# Patient Record
Sex: Female | Born: 1995 | Race: Black or African American | Hispanic: No | Marital: Single | Smoking: Never smoker
Health system: Southern US, Community
[De-identification: ages and names within clinical notes are randomized; demographics above are authoritative.]

## PROBLEM LIST (undated history)

## (undated) ENCOUNTER — Inpatient Hospital Stay: Payer: Self-pay

## (undated) ENCOUNTER — Inpatient Hospital Stay (HOSPITAL_COMMUNITY): Payer: Self-pay

## (undated) DIAGNOSIS — F909 Attention-deficit hyperactivity disorder, unspecified type: Secondary | ICD-10-CM

## (undated) DIAGNOSIS — L509 Urticaria, unspecified: Secondary | ICD-10-CM

## (undated) DIAGNOSIS — J3089 Other allergic rhinitis: Secondary | ICD-10-CM

## (undated) DIAGNOSIS — T783XXA Angioneurotic edema, initial encounter: Secondary | ICD-10-CM

## (undated) DIAGNOSIS — L309 Dermatitis, unspecified: Secondary | ICD-10-CM

## (undated) DIAGNOSIS — L209 Atopic dermatitis, unspecified: Secondary | ICD-10-CM

## (undated) DIAGNOSIS — J069 Acute upper respiratory infection, unspecified: Secondary | ICD-10-CM

## (undated) HISTORY — PX: WISDOM TOOTH EXTRACTION: SHX21

## (undated) HISTORY — DX: Acute upper respiratory infection, unspecified: J06.9

## (undated) HISTORY — DX: Urticaria, unspecified: L50.9

## (undated) HISTORY — DX: Angioneurotic edema, initial encounter: T78.3XXA

---
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
## 2016-01-21 DIAGNOSIS — F39 Unspecified mood [affective] disorder: Secondary | ICD-10-CM | POA: Diagnosis present

## 2016-02-11 ENCOUNTER — Ambulatory Visit (INDEPENDENT_AMBULATORY_CARE_PROVIDER_SITE_OTHER): Payer: Medicaid Other | Admitting: Allergy and Immunology

## 2016-02-11 ENCOUNTER — Encounter: Payer: Self-pay | Admitting: Allergy and Immunology

## 2016-02-11 VITALS — BP 128/68 | HR 72 | Temp 98.4°F | Resp 16 | Ht 62.6 in | Wt 190.7 lb

## 2016-02-11 DIAGNOSIS — L209 Atopic dermatitis, unspecified: Secondary | ICD-10-CM

## 2016-02-11 DIAGNOSIS — J3089 Other allergic rhinitis: Secondary | ICD-10-CM

## 2016-02-11 DIAGNOSIS — T7800XA Anaphylactic reaction due to unspecified food, initial encounter: Secondary | ICD-10-CM | POA: Diagnosis not present

## 2016-02-11 DIAGNOSIS — R062 Wheezing: Secondary | ICD-10-CM

## 2016-02-11 HISTORY — DX: Other allergic rhinitis: J30.89

## 2016-02-11 HISTORY — DX: Atopic dermatitis, unspecified: L20.9

## 2016-02-11 MED ORDER — ALBUTEROL SULFATE HFA 108 (90 BASE) MCG/ACT IN AERS: INHALATION_SPRAY | RESPIRATORY_TRACT | Status: DC

## 2016-02-11 MED ORDER — EPINEPHRINE 0.3 MG/0.3ML IJ SOAJ: 0.3000 mg | Freq: Once | INTRAMUSCULAR | Status: DC

## 2016-02-11 MED ORDER — CETIRIZINE HCL 10 MG PO TABS
10.0000 mg | ORAL_TABLET | Freq: Every day | ORAL | Status: DC
Start: 2016-02-11 — End: 2017-04-22

## 2016-02-11 MED ORDER — FLUTICASONE PROPIONATE 50 MCG/ACT NA SUSP: 2.0000 | Freq: Every day | NASAL | Status: DC | PRN

## 2016-02-11 NOTE — Assessment & Plan Note (Signed)
   Continue albuterol HFA every 4-6 hours as needed.  Subjective and objective measures of pulmonary function will be followed and the treatment plan will be adjusted accordingly.

## 2016-02-11 NOTE — Patient Instructions (Addendum)
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  Meticulous avoidance of shellfish as discussed.  The importance of having access to epinephrine auto-injector 2 pack has been emphasized.  A food allergy action plan has been provided and discussed.  Medic Alert identification is recommended.  Seasonal and perennial allergic rhinoconjunctivitis  Aeroallergen avoidance measures have been discussed and provided in written form.  A prescription has been provided for fluticasone nasal spray, 2 sprays per nostril daily as needed. Proper nasal spray technique has been discussed and demonstrated.  A prescription has been provided for cetirizine 10 mg daily as needed.  If allergen avoidance measures and medications fail to adequately relieve symptoms, aeroallergen immunotherapy will be considered.  Atopic dermatitis  Appropriate skin care recommendations have been provided verbally and in written form.  A prescription has been provided for desonide 0.05% ointment sparingly to affected areas twice daily as needed to the face and/or neck. Care is to be taken to avoid the eyes.  A prescription has been provided for mometasone 0.1% ointment sparingly to affected areas daily as needed below the face and neck. Care is to be taken to avoid the axillae and groin area.  The patient has been asked to make note of any foods that trigger symptom flares.  Fingernails are to be kept trimmed.  Information regarding diluted bleach baths has been discussed and provided in written form.  Intermittent coughing/wheezing  Continue albuterol HFA every 4-6 hours as needed.  Subjective and objective measures of pulmonary function will be followed and the treatment plan will be adjusted accordingly.    Return in about 4 months (around 06/12/2016), or if symptoms worsen or fail to improve.  ECZEMA SKIN CARE REGIMEN:  Bathed and soak  for 10 minutes in warm water once today. Pat dry.  Immediately apply the below creams: To healthy skin apply Aquaphor or Vaseline jelly twice a day. To affected areas on the face and neck, apply: . Desonide 0.05% ointment twice a day as needed. . Be careful to avoid the eyes. To affected areas on the body (below the face and neck), apply: . Mometasone 0.1% ointment once a day as needed. . With ointments be careful to avoid the armpits and groin area. Note of any foods make the eczema worse. Keep finger nails trimmed and filed.  Diluted bleach bath recipe and instructions:   Add  -  cup of common household bleach to a bathtub full of water.  Soak the affected part of the body (below the head and neck) for about 10 minutes.  Limit diluted bleach baths to no more than twice a week.   Do not submerge the head or face and be very careful to avoid getting the diluted bleach into the eyes.   Rinse off with fresh water and apply moisturizer.  Reducing Pollen Exposure  The American Academy of Allergy, Asthma and Immunology suggests the following steps to reduce your exposure to pollen during allergy seasons.    1. Do not hang sheets or clothing out to dry; pollen may collect on these items. 2. Do not mow lawns or spend time around freshly cut grass; mowing stirs up pollen. 3. Keep windows closed at night.  Keep car windows closed while driving. 4. Minimize morning activities outdoors, a time when pollen counts are usually at their highest. 5. Stay indoors as much as possible when pollen counts or humidity is high and on windy days when pollen  tends to remain in the air longer. 6. Use air conditioning when possible.  Many air conditioners have filters that trap the pollen spores. 7. Use a HEPA room air filter to remove pollen form the indoor air you breathe.   Control of House Dust Mite Allergen  House dust mites play a major role in allergic asthma and rhinitis.  They occur in  environments with high humidity wherever human skin, the food for dust mites is found. High levels have been detected in dust obtained from mattresses, pillows, carpets, upholstered furniture, bed covers, clothes and soft toys.  The principal allergen of the house dust mite is found in its feces.  A gram of dust may contain 1,000 mites and 250,000 fecal particles.  Mite antigen is easily measured in the air during house cleaning activities.    1. Encase mattresses, including the box spring, and pillow, in an air tight cover.  Seal the zipper end of the encased mattresses with wide adhesive tape. 2. Wash the bedding in water of 130 degrees Farenheit weekly.  Avoid cotton comforters/quilts and flannel bedding: the most ideal bed covering is the dacron comforter. 3. Remove all upholstered furniture from the bedroom. 4. Remove carpets, carpet padding, rugs, and non-washable window drapes from the bedroom.  Wash drapes weekly or use plastic window coverings. 5. Remove all non-washable stuffed toys from the bedroom.  Wash stuffed toys weekly. 6. Have the room cleaned frequently with a vacuum cleaner and a damp dust-mop.  The patient should not be in a room which is being cleaned and should wait 1 hour after cleaning before going into the room. 7. Close and seal all heating outlets in the bedroom.  Otherwise, the room will become filled with dust-laden air.  An electric heater can be used to heat the room. 8. Reduce indoor humidity to less than 50%.  Do not use a humidifier.

## 2016-02-11 NOTE — Progress Notes (Signed)
New Patient Note  RE: Alyssa [name] MRN: [id number] DOB: 1996/04/28 Date of Office Visit: 02/11/2016  Referring provider: Verlon AuBoyd, Tammy Lamonica, MD Primary care provider: Verlon AuBoyd, Tammy Lamonica, MD  Chief Complaint: Food Intolerance; Allergic Rhinitis ; and Eczema   History of present illness: HPI Comments: Alyssa [name] is a 20 y.o. female who presents today for consultation of possible food allergy and rhinitis.  She is accompanied by her mother who assists with the history.  This past summer she consumed shrimp, took a nap, and woke up with facial swelling and swelling of the eyelids.  She denies concomitant cardiopulmonary or GI symptoms.  Approximately one month ago she consumed 1 shrimp and within 15 minutes developed flushing, rash, and burning sensation on her chest, neck, and face.  Approximately 3 weeks ago, she accidentally consumed a bite of someone's meal which contained shrimp and within minutes developed facial swelling, ringing of the ears, visual changes, and dyspnea.  She took diphenhydramine and her symptoms resolved completely within an hour.  She has meticulously avoided shrimp since that time.  She has epinephrine autoinjectors.   [name] experiences nasal congestion, rhinorrhea, sneezing, and ocular pruritus.  These symptoms occur predominantly with pollen exposure.  She has had eczema since infancy.  The eczema primarily involves her wrists, hands, arms, ankles, legs, abdomen, and her face in a perioral distribution.  Her eczema seems to be exacerbated by heat and consumption of acidic foods, such as tomato products.  The eczema is currently not well controlled with triamcinolone 0.1% cream.  In the past, triamcinolone 0.1% ointment has worked a little bit better than the cream for her eczema.  Her mother reports that Alyssa [name] has a history of bronchitis and "borderline asthma".  Her lower respiratory symptoms consist of coughing, chest tightness, and  wheezing.  These symptoms occur rarely and seem to be triggered by hot air and pollen exposure.  She has an albuterol HFA inhaler.   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  Meticulous avoidance of shellfish as discussed.  The importance of having access to epinephrine auto-injector 2 pack has been emphasized.  A food allergy action plan has been provided and discussed.  Medic Alert identification is recommended.  Seasonal and perennial allergic rhinoconjunctivitis  Aeroallergen avoidance measures have been discussed and provided in written form.  A prescription has been provided for fluticasone nasal spray, 2 sprays per nostril daily as needed. Proper nasal spray technique has been discussed and demonstrated.  A prescription has been provided for cetirizine 10 mg daily as needed.  If allergen avoidance measures and medications fail to adequately relieve symptoms, aeroallergen immunotherapy will be considered.  Atopic dermatitis  Appropriate skin care recommendations have been provided verbally and in written form.  A prescription has been provided for desonide 0.05% ointment sparingly to affected areas twice daily as needed to the face and/or neck. Care is to be taken to avoid the eyes.  A prescription has been provided for mometasone 0.1% ointment sparingly to affected areas daily as needed below the face and neck. Care is to be taken to avoid the axillae and groin area.  The patient has been asked to make note of any foods that trigger symptom flares.  Fingernails are to be kept trimmed.  Information regarding diluted bleach baths has been discussed and provided in written form.  Intermittent coughing/wheezing  Continue albuterol HFA every 4-6 hours as needed.  Subjective and objective measures of pulmonary function will be followed and the treatment plan will be  adjusted accordingly.    Meds ordered this encounter  Medications  . EPINEPHrine 0.3 mg/0.3 mL IJ SOAJ injection    Sig: Inject 0.3 mLs (0.3 mg total) into the muscle once.    Dispense:  2 Device    Refill:  2    PLACE PRESCRIPTION ON HOLD.  . fluticasone (FLONASE) 50 MCG/ACT nasal spray    Sig: Place 2 sprays into both nostrils daily as needed for allergies or rhinitis.    Dispense:  16 g    Refill:  5  . cetirizine (ZYRTEC) 10 MG tablet    Sig: Take 1 tablet (10 mg total) by mouth daily.    Dispense:  30 tablet    Refill:  5  . albuterol (PROAIR HFA) 108 (90 Base) MCG/ACT inhaler    Sig: TWO PUFFS EVERY 4-6 HOURS AS NEEDED FOR COUGH OR WHEEZE.    Dispense:  1 Inhaler    Refill:  1    Diagnositics: Spirometry: FVC was 3.30 L (95% predicted) and FEV1 was 2.72 L (88% predicted) without significant post bronchodilator improvement. AEnvironmental skin testing: Positive to grass pollen, ragweed pollen, weed pollen, tree pollen, and dust mite antigen. Food allergen skin testing: Positive to shellfish mix.    Physical examination: Blood pressure 128/68, pulse 72, temperature 98.4 F (36.9 C), temperature source Oral, resp. rate 16, height 5' 2.6" (1.59 m), weight 190 lb 11.2 oz (86.5 kg).  General: Alert, interactive, in no acute distress. HEENT: TMs pearly gray, turbinates edematous without discharge, post-pharynx moderately erythematous. Neck: Supple without lymphadenopathy. Lungs: Clear to auscultation without wheezing, rhonchi or rales. CV: Normal S1, S2 without murmurs. Abdomen: Nondistended, nontender. Skin: Dry, mildly hyperpigmented, mildly thickened patches on the hands, wrists, forearms, antecubital fossae, and ankles bilaterally. Extremities:  No clubbing, cyanosis or edema. Neuro:   Grossly intact.  Review of systems:  Review of Systems  Constitutional: Negative for fever, chills and weight loss.  HENT: Positive for congestion. Negative for nosebleeds.   Eyes:  Negative for blurred vision.  Respiratory: Positive for cough, shortness of breath and wheezing. Negative for hemoptysis.   Cardiovascular: Negative for chest pain.  Gastrointestinal: Negative for diarrhea and constipation.  Genitourinary: Negative for dysuria.  Musculoskeletal: Negative for myalgias and joint pain.  Skin: Positive for itching and rash.  Neurological: Negative for dizziness.  Endo/Heme/Allergies: Positive for environmental allergies. Does not bruise/bleed easily.    Past medical history:  Past Medical History  Diagnosis Date  . Asthma   . Eczema   . ADHD (attention deficit hyperactivity disorder)   . Recurrent upper respiratory infection (URI)   . Urticaria   . Angio-edema     Past surgical history:  Past Surgical History  Procedure Laterality Date  . Wisdom tooth extraction      Family history: Family History  Problem Relation Age of Onset  . Asthma Mother   . Eczema Mother   . Allergic rhinitis Mother   . Angioedema Mother   . Asthma Brother   . Urticaria Neg Hx   . Immunodeficiency Neg Hx   . Asthma Brother   . Allergic rhinitis Brother     Social history: Social History   Social History  . Marital Status: Single    Spouse Name: N/A  . Number of Children: N/A  . Years of Education: N/A   Occupational History  . Not on file.   Social  History Main Topics  . Smoking status: Never Smoker   . Smokeless tobacco: Never Used  . Alcohol Use: No  . Drug Use: No  . Sexual Activity: Not on file   Other Topics Concern  . Not on file   Social History Narrative   Environmental History:  The patient lives in an apartment with carpeting throughout and central heat/air. She is a nonsmoker without pets.    Medication List       This list is accurate as of: 02/11/16  5:52 PM.  Always use your most recent med list.               albuterol 108 (90 Base) MCG/ACT inhaler  Commonly known as:  PROAIR HFA  TWO PUFFS EVERY 4-6 HOURS AS NEEDED FOR  COUGH OR WHEEZE.     cetirizine 10 MG tablet  Commonly known as:  ZYRTEC  Take 1 tablet (10 mg total) by mouth daily.     EPINEPHrine 0.3 mg/0.3 mL Soaj injection  Commonly known as:  EPI-PEN  Inject 0.3 mLs (0.3 mg total) into the muscle once.     fluticasone 50 MCG/ACT nasal spray  Commonly known as:  FLONASE  Place 2 sprays into both nostrils daily as needed for allergies or rhinitis.     ibuprofen 600 MG tablet  Commonly known as:  ADVIL,MOTRIN  Take 1 tablet (600 mg total) by mouth every 8 (eight) hours as needed for mild pain or moderate pain.     predniSONE 50 MG tablet  Commonly known as:  DELTASONE  Take 1 tablet (50 mg total) by mouth daily.     triamcinolone cream 0.1 %  Commonly known as:  KENALOG  Apply topically.        Known medication allergies: Allergies  Allergen Reactions  . Banana Itching and Swelling  . Peach Flavor Itching    I appreciate the opportunity to take part in this Clementine's care. Please do not hesitate to contact me with questions.  Sincerely,   R. Jorene Guest, MD

## 2016-02-11 NOTE — Assessment & Plan Note (Addendum)
The patient's history suggests shellfish allergy and positive skin test results today confirm this diagnosis.  Meticulous avoidance of shellfish as discussed.  The importance of having access to epinephrine auto-injector 2 pack has been emphasized.  A food allergy action plan has been provided and discussed.  Medic Alert identification is recommended.

## 2016-02-11 NOTE — Assessment & Plan Note (Signed)
   Aeroallergen avoidance measures have been discussed and provided in written form.  A prescription has been provided for fluticasone nasal spray, 2 sprays per nostril daily as needed. Proper nasal spray technique has been discussed and demonstrated.  A prescription has been provided for cetirizine 10 mg daily as needed.  If allergen avoidance measures and medications fail to adequately relieve symptoms, aeroallergen immunotherapy will be considered. 

## 2016-02-11 NOTE — Assessment & Plan Note (Addendum)
   Appropriate skin care recommendations have been provided verbally and in written form.  A prescription has been provided for desonide 0.05% ointment sparingly to affected areas twice daily as needed to the face and/or neck. Care is to be taken to avoid the eyes.  A prescription has been provided for mometasone 0.1% ointment sparingly to affected areas daily as needed below the face and neck. Care is to be taken to avoid the axillae and groin area.  The patient has been asked to make note of any foods that trigger symptom flares.  Fingernails are to be kept trimmed.  Information regarding diluted bleach baths has been discussed and provided in written form.

## 2016-02-12 ENCOUNTER — Other Ambulatory Visit: Payer: Self-pay

## 2016-02-12 MED ORDER — MOMETASONE FUROATE 0.1 % EX OINT: TOPICAL_OINTMENT | CUTANEOUS | Status: DC

## 2016-02-12 MED ORDER — DESONIDE 0.05 % EX OINT: 1.0000 "application " | TOPICAL_OINTMENT | Freq: Two times a day (BID) | CUTANEOUS | Status: DC

## 2016-02-12 NOTE — Telephone Encounter (Signed)
Patient was seen on yesterday by Dr. Nunzio CobbsBobbitt, [facility] [facility] [address] and her creams were not called in.  Please Advise

## 2016-02-12 NOTE — Telephone Encounter (Signed)
RX FOR DESONIDE AND MOMETASONE OINTMENT SENT TO [facility], NO ANSWER ON PHONE

## 2016-02-18 ENCOUNTER — Encounter: Payer: Self-pay | Admitting: *Deleted

## 2016-03-26 ENCOUNTER — Encounter (HOSPITAL_COMMUNITY): Payer: Self-pay | Admitting: Emergency Medicine

## 2016-03-26 ENCOUNTER — Emergency Department (HOSPITAL_COMMUNITY)
Admission: EM | Admit: 2016-03-26 | Discharge: 2016-03-26 | Disposition: A | Discharge status: Home / Self Care | Payer: Medicaid Other | Attending: Emergency Medicine | Admitting: Emergency Medicine

## 2016-03-26 ENCOUNTER — Emergency Department (HOSPITAL_COMMUNITY): Payer: Medicaid Other

## 2016-03-26 DIAGNOSIS — Z8659 Personal history of other mental and behavioral disorders: Secondary | ICD-10-CM | POA: Diagnosis not present

## 2016-03-26 DIAGNOSIS — J45909 Unspecified asthma, uncomplicated: Secondary | ICD-10-CM | POA: Diagnosis not present

## 2016-03-26 DIAGNOSIS — Z872 Personal history of diseases of the skin and subcutaneous tissue: Secondary | ICD-10-CM | POA: Insufficient documentation

## 2016-03-26 DIAGNOSIS — S29001A Unspecified injury of muscle and tendon of front wall of thorax, initial encounter: Secondary | ICD-10-CM | POA: Insufficient documentation

## 2016-03-26 DIAGNOSIS — Y9389 Activity, other specified: Secondary | ICD-10-CM | POA: Insufficient documentation

## 2016-03-26 DIAGNOSIS — Y998 Other external cause status: Secondary | ICD-10-CM | POA: Insufficient documentation

## 2016-03-26 DIAGNOSIS — Z7951 Long term (current) use of inhaled steroids: Secondary | ICD-10-CM | POA: Insufficient documentation

## 2016-03-26 DIAGNOSIS — Z79899 Other long term (current) drug therapy: Secondary | ICD-10-CM | POA: Diagnosis not present

## 2016-03-26 DIAGNOSIS — S0990XA Unspecified injury of head, initial encounter: Secondary | ICD-10-CM | POA: Diagnosis not present

## 2016-03-26 DIAGNOSIS — Y92410 Unspecified street and highway as the place of occurrence of the external cause: Secondary | ICD-10-CM | POA: Insufficient documentation

## 2016-03-26 DIAGNOSIS — S4991XA Unspecified injury of right shoulder and upper arm, initial encounter: Secondary | ICD-10-CM | POA: Insufficient documentation

## 2016-03-26 DIAGNOSIS — S199XXA Unspecified injury of neck, initial encounter: Secondary | ICD-10-CM | POA: Diagnosis not present

## 2016-03-26 MED ORDER — IBUPROFEN 100 MG/5ML PO SUSP
600.0000 mg | Freq: Once | ORAL | Status: AC
  Administered 2016-03-26: 600 mg via ORAL
  Filled 2016-03-26: qty 30

## 2016-03-26 MED ORDER — HYDROCODONE-ACETAMINOPHEN 5-325 MG PO TABS: 1.0000 | ORAL_TABLET | Freq: Four times a day (QID) | ORAL | Status: DC | PRN
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
## 2016-03-26 NOTE — Discharge Instructions (Signed)
Return here as needed.  Follow-up with the orthopedist provide. Apply ice to your shoulder.

## 2016-05-22 ENCOUNTER — Emergency Department (HOSPITAL_COMMUNITY)
Admission: EM | Admit: 2016-05-22 | Discharge: 2016-05-22 | Disposition: A | Discharge status: Home / Self Care | Payer: Medicaid Other | Attending: Emergency Medicine | Admitting: Emergency Medicine

## 2016-05-22 ENCOUNTER — Encounter (HOSPITAL_COMMUNITY): Payer: Self-pay | Admitting: *Deleted

## 2016-05-22 DIAGNOSIS — Y939 Activity, unspecified: Secondary | ICD-10-CM | POA: Insufficient documentation

## 2016-05-22 DIAGNOSIS — J45909 Unspecified asthma, uncomplicated: Secondary | ICD-10-CM | POA: Diagnosis not present

## 2016-05-22 DIAGNOSIS — Y999 Unspecified external cause status: Secondary | ICD-10-CM | POA: Insufficient documentation

## 2016-05-22 DIAGNOSIS — B351 Tinea unguium: Secondary | ICD-10-CM | POA: Diagnosis not present

## 2016-05-22 DIAGNOSIS — Y929 Unspecified place or not applicable: Secondary | ICD-10-CM | POA: Diagnosis not present

## 2016-05-22 DIAGNOSIS — W1830XA Fall on same level, unspecified, initial encounter: Secondary | ICD-10-CM | POA: Insufficient documentation

## 2016-05-22 DIAGNOSIS — M79674 Pain in right toe(s): Secondary | ICD-10-CM | POA: Diagnosis present

## 2016-05-22 DIAGNOSIS — M79675 Pain in left toe(s): Secondary | ICD-10-CM

## 2016-05-22 DIAGNOSIS — Z79899 Other long term (current) drug therapy: Secondary | ICD-10-CM | POA: Diagnosis not present

## 2016-05-22 MED ORDER — EFINACONAZOLE 10 % EX SOLN: 1.0000 "application " | Freq: Every day | CUTANEOUS | Status: DC

## 2016-05-22 NOTE — ED Provider Notes (Signed)
CSN: [id number]     Arrival date & time 05/22/16  2149 History  By signing my name below, I, Placido SouLogan Joldersma, attest that this documentation has been prepared under the direction and in the presence of Everlene FarrierWilliam Daxon Kyne, PA-C. Electronically Signed: Placido SouLogan Joldersma, ED Scribe. 05/22/2016. 11:31 PM.   Chief Complaint  Patient presents with  . Foot Pain   The history is provided by the patient. No language interpreter was used.    HPI Comments: Alyssa [name] is a 20 y.o. female who presents to the Emergency Department due to her right great toenail falling off 2 days ago. Pt states that "something was wrong with my toenail" and attempted to clean the affected toenail with it then falling off. She reports accidentally striking it "a couple of times" recently, prior to the onset of her symptoms. She states she is concerned her toenails "have fungus". Pt confirms being ambulatory. Her toenails are currently painted. She denies fevers, chills, numbness or tingling, or any other associated symptoms at this time.   Past Medical History  Diagnosis Date  . Asthma   . Eczema   . ADHD (attention deficit hyperactivity disorder)   . Recurrent upper respiratory infection (URI)   . Urticaria   . Angio-edema    Past Surgical History  Procedure Laterality Date  . Wisdom tooth extraction     Family History  Problem Relation Age of Onset  . Asthma Mother   . Eczema Mother   . Allergic rhinitis Mother   . Angioedema Mother   . Asthma Brother   . Urticaria Neg Hx   . Immunodeficiency Neg Hx   . Asthma Brother   . Allergic rhinitis Brother    Social History  Substance Use Topics  . Smoking status: Never Smoker   . Smokeless tobacco: Never Used  . Alcohol Use: No   OB History    No data available     Review of Systems  Constitutional: Negative for fever and chills.  Musculoskeletal: Negative for myalgias and arthralgias.  Skin: Positive for wound. Negative for color change.   Neurological: Negative for weakness and numbness.    Allergies  Banana; Peach flavor; and Shellfish allergy  Home Medications   Prior to Admission medications   Medication Sig Start Date End Date Taking? Authorizing Provider  albuterol (PROAIR HFA) 108 (90 Base) MCG/ACT inhaler TWO PUFFS EVERY 4-6 HOURS AS NEEDED FOR COUGH OR WHEEZE. 02/11/16   Cristal Fordalph Carter Bobbitt, MD  cetirizine (ZYRTEC) 10 MG tablet Take 1 tablet (10 mg total) by mouth daily. 02/11/16   Cristal Fordalph Carter Bobbitt, MD  desonide (DESOWEN) 0.05 % ointment Apply 1 application topically 2 (two) times daily. Apply to face/neck once daily avoiding eyes as directed 02/12/16   Cristal Fordalph Carter Bobbitt, MD  Efinaconazole 10 % SOLN Apply 1 application topically daily. 05/22/16   Everlene FarrierWilliam Charlann Wayne, PA-C  EPINEPHrine 0.3 mg/0.3 mL IJ SOAJ injection Inject 0.3 mLs (0.3 mg total) into the muscle once. 02/11/16   Cristal Fordalph Carter Bobbitt, MD  fluticasone [facility](FLONASE) 50 MCG/ACT nasal spray Place 2 sprays into both nostrils daily as needed for allergies or rhinitis. 02/11/16   Cristal Fordalph Carter Bobbitt, MD  HYDROcodone-acetaminophen (NORCO/VICODIN) 5-325 MG tablet Take 1 tablet by mouth every 6 (six) hours as needed for moderate pain. 03/26/16   Charlestine Nighthristopher Lawyer, PA-C  mometasone (ELOCON) 0.1 % ointment Apply to areas below face/neck once daily as directed 02/12/16   Cristal Fordalph Carter Bobbitt, MD  triamcinolone cream (KENALOG) 0.1 % Apply 1 application  topically 2 (two) times daily.  01/21/16 01/20/17  Historical Provider, MD   BP 126/93 mmHg  Pulse 72  Temp(Src) 98 F (36.7 C) (Oral)  Resp 16  Ht 5\' 2"  (1.575 m)  Wt 81.647 kg  BMI 32.91 kg/m2  SpO2 100%    Physical Exam  Constitutional: She appears well-developed and well-nourished. No distress.  Nontoxic appearing.  HENT:  Head: Normocephalic and atraumatic.  Eyes: Right eye exhibits no discharge. Left eye exhibits no discharge.  Cardiovascular: Normal rate, regular rhythm and intact distal pulses.   Bilateral  DP/PT pulses intact  Pulmonary/Chest: Effort normal. No respiratory distress.  Musculoskeletal: Normal range of motion. She exhibits no edema.  Nail polish noted to toenails. Left great toe nail is partially missing however nailbed is intact. Onychomycosis of great toes evident beneath nail polish.  No erythema, edema or ecchymosis. No evidence of infection.   Neurological: She is alert. Coordination normal.  Skin: Skin is warm and dry. No rash noted. She is not diaphoretic. No erythema. No pallor.  Psychiatric: She has a normal mood and affect. Her behavior is normal.  Nursing note and vitals reviewed.   ED Course  Procedures  DIAGNOSTIC STUDIES: Oxygen Saturation is 100% on RA, normal by my interpretation.    COORDINATION OF CARE: 11:29 PM Discussed next steps with pt. Pt verbalized understanding and is agreeable with the plan.   Labs Review Labs Reviewed - No data to display  Imaging Review No results found.   EKG Interpretation None      Filed Vitals:   05/22/16 2252 05/22/16 2326  BP: 117/92 126/93  Pulse: 60 72  Temp: 98.4 F (36.9 C) 98 F (36.7 C)  TempSrc: Oral Oral  Resp: 18 16  Height: 5\' 2"  (1.575 m)   Weight: 81.647 kg   SpO2: 100% 100%     MDM   Meds given in ED:  Medications - No data to display  Discharge Medication List as of 05/22/2016 11:35 PM    START taking these medications   Details  Efinaconazole 10 % SOLN Apply 1 application topically daily., Starting 05/22/2016, Until Discontinued, Print        Final diagnoses:  Pain due to onychomycosis of toenail of left foot   This is a 20 y.o. female who presents to the Emergency Department due to her right great toenail falling off 2 days ago. Pt states that "something was wrong with my toenail" and attempted to clean the affected toenail with it then falling off. She reports accidentally striking it "a couple of times" recently, prior to the onset of her symptoms. She states she is concerned her  toenails "have fungus". Pt confirms being ambulatory. Her toenails are currently painted.  On exam the patient is afebrile nontoxic appearing. Her toenails are painted. Her right great toenail is partially absent. Nailbed is intact. Evidence of onychomycosis beneath her nail bed. No erythema or edema. No evidence of skin infection. Suspect this is related to onychomycosis. I advised patient to remove her toenail polish. Eyelashes soak and vinegar and use jublia on nails for fungus. I encouraged her to make an appointment for follow-up with podiatry. I advised the patient to follow-up with their primary care provider this week. I advised the patient to return to the emergency department with new or worsening symptoms or new concerns. The patient verbalized understanding and agreement with plan.    I personally performed the services described in this documentation, which was scribed in my presence.  The recorded information has been reviewed and is accurate.       Everlene Farrier, PA-C 05/23/16 4098  Tomasita Crumble, MD 05/23/16 [phone]

## 2016-05-22 NOTE — ED Notes (Signed)
Pt states that her big toe on her right foot lost its nail a couple days ago. States that there is pain. States that her left foot big toe is painful. Pt is concerned that the nails have fungus.

## 2016-05-22 NOTE — Discharge Instructions (Signed)
Nail Ringworm A fungal infection of the nail (tinea unguium/onychomycosis) is common. It is common as the visible part of the nail is composed of dead cells which have no blood supply to help prevent infection. It occurs because fungi are everywhere and will pick any opportunity to grow on any dead material. Because nails are very slow growing they require up to 2 years of treatment with anti-fungal medications. The entire nail back to the base is infected. This includes approximately  of the nail which you cannot see. If your caregiver has prescribed a medication by mouth, take it every day and as directed. No progress will be seen for at least 6 to 9 months. Do not be disappointed! Because fungi live on dead cells with little or no exposure to blood supply, medication delivery to the infection is slow; thus the cure is slow. It is also why you can observe no progress in the first 6 months. The nail becoming cured is the base of the nail, as it has the blood supply. Topical medication such as creams and ointments are usually not effective. Important in successful treatment of nail fungus is closely following the medication regimen that your doctor prescribes. Sometimes you and your caregiver may elect to speed up this process by surgical removal of all the nails. Even this may still require 6 to 9 months of additional oral medications. See your caregiver as directed. Remember there will be no visible improvement for at least 6 months. See your caregiver sooner if other signs of infection (redness and swelling) develop.   This information is not intended to replace advice given to you by your health care provider. Make sure you discuss any questions you have with your health care provider.   Document Released: 11/07/2000 Document Revised: 03/27/2015 Document Reviewed: 05/14/2015 Elsevier Interactive Patient Education 2016 [employer]  

## 2016-06-20 ENCOUNTER — Ambulatory Visit (HOSPITAL_COMMUNITY)
Admission: EM | Admit: 2016-06-20 | Discharge: 2016-06-20 | Disposition: A | Discharge status: Other Acute Inpatient Hospital | Payer: Medicaid Other

## 2016-06-20 NOTE — ED Triage Notes (Signed)
Patient not seen.

## 2016-07-19 ENCOUNTER — Encounter (HOSPITAL_COMMUNITY): Payer: Self-pay | Admitting: Emergency Medicine

## 2016-07-19 ENCOUNTER — Emergency Department (HOSPITAL_COMMUNITY)
Admission: EM | Admit: 2016-07-19 | Discharge: 2016-07-19 | Disposition: A | Discharge status: Home / Self Care | Payer: Medicaid Other | Attending: Emergency Medicine | Admitting: Emergency Medicine

## 2016-07-19 DIAGNOSIS — F909 Attention-deficit hyperactivity disorder, unspecified type: Secondary | ICD-10-CM | POA: Insufficient documentation

## 2016-07-19 DIAGNOSIS — R079 Chest pain, unspecified: Secondary | ICD-10-CM

## 2016-07-19 DIAGNOSIS — R0789 Other chest pain: Secondary | ICD-10-CM | POA: Diagnosis not present

## 2016-07-19 DIAGNOSIS — R1032 Left lower quadrant pain: Secondary | ICD-10-CM

## 2016-07-19 DIAGNOSIS — J45909 Unspecified asthma, uncomplicated: Secondary | ICD-10-CM | POA: Diagnosis not present

## 2016-07-19 DIAGNOSIS — R1030 Lower abdominal pain, unspecified: Secondary | ICD-10-CM | POA: Insufficient documentation

## 2016-07-19 DIAGNOSIS — R1031 Right lower quadrant pain: Secondary | ICD-10-CM

## 2016-07-19 LAB — URINALYSIS, ROUTINE W REFLEX MICROSCOPIC
BILIRUBIN URINE: NEGATIVE
GLUCOSE, UA: NEGATIVE mg/dL
HGB URINE DIPSTICK: NEGATIVE
Ketones, ur: NEGATIVE mg/dL
LEUKOCYTES UA: NEGATIVE
Nitrite: NEGATIVE
PH: 6.5 (ref 5.0–8.0)
Protein, ur: NEGATIVE mg/dL
Specific Gravity, Urine: 1.026 (ref 1.005–1.030)

## 2016-07-19 LAB — WET PREP, GENITAL
Clue Cells Wet Prep HPF POC: NONE SEEN
Sperm: NONE SEEN
Trich, Wet Prep: NONE SEEN
Yeast Wet Prep HPF POC: NONE SEEN

## 2016-07-19 LAB — PREGNANCY, URINE: PREG TEST UR: NEGATIVE

## 2016-07-19 MED ORDER — LIDOCAINE HCL (PF) 1 % IJ SOLN
INTRAMUSCULAR | Status: AC
  Filled 2016-07-19: qty 5

## 2016-07-19 MED ORDER — CEFTRIAXONE SODIUM 250 MG IJ SOLR
250.0000 mg | Freq: Once | INTRAMUSCULAR | Status: AC
  Administered 2016-07-19: 250 mg via INTRAMUSCULAR
  Filled 2016-07-19: qty 250

## 2016-07-19 MED ORDER — AZITHROMYCIN 250 MG PO TABS
1000.0000 mg | ORAL_TABLET | Freq: Once | ORAL | Status: DC
  Filled 2016-07-19: qty 4

## 2016-07-19 MED ORDER — AZITHROMYCIN 200 MG/5ML PO SUSR
1000.0000 mg | Freq: Once | ORAL | Status: AC
  Administered 2016-07-19: 1000 mg via ORAL
  Filled 2016-07-19: qty 25

## 2016-07-19 NOTE — ED Notes (Signed)
Care handoff to Brooke, RN 

## 2016-07-19 NOTE — ED Provider Notes (Signed)
[facility]-EMERGENCY DEPT Provider Note   CSN: [id number] Arrival date & time: 07/19/16  8119     History   Chief Complaint Chief Complaint  Patient presents with  . Abdominal Pain    lower    HPI Alyssa [name] is a 20 y.o. female.  Patient presents with 2 weeks of dysuria without vaginal bleeding or discharge. She has increased frequency and urgency and a feeling of incomplete emptying. No previous history of urinary tract infection. Patient is sexually active, last intercourse yesterday. Prior to this, patient states she was abstinent for 7 months. No history of sexually transmitted diseases. No reported fevers, vomiting, or flank/back pains. No treatments prior to arrival. She has never had a pelvic exam. The onset of this condition was acute. The course is constant. Aggravating factors: none. Alleviating factors: none.   She does report having intermittent chest pains lasting < 90 seconds, no associated syncope, shortness of breath. Pains have been occurring daily. Not associated with food. No family history of sudden early cardiac death. No heavy NSAID or GERD history. Patient denies risk factors for pulmonary embolism including: unilateral leg swelling, history of DVT/PE/other blood clots, use of exogenous hormones, recent immobilizations, recent surgery, recent travel (>4hr segment), malignancy, hemoptysis.        Past Medical History:  Diagnosis Date  . ADHD (attention deficit hyperactivity disorder)   . Angio-edema   . Asthma   . Eczema   . Recurrent upper respiratory infection (URI)   . Urticaria     Patient Active Problem List   Diagnosis Date Noted  . Food allergy 02/11/2016  . Seasonal and perennial allergic rhinoconjunctivitis 02/11/2016  . Atopic dermatitis 02/11/2016  . Intermittent coughing/wheezing 02/11/2016    Past Surgical History:  Procedure Laterality Date  . WISDOM TOOTH EXTRACTION      OB History    No data available       Home  Medications    Prior to Admission medications   Medication Sig Start Date End Date Taking? Authorizing Provider  albuterol (PROAIR HFA) 108 (90 Base) MCG/ACT inhaler TWO PUFFS EVERY 4-6 HOURS AS NEEDED FOR COUGH OR WHEEZE. 02/11/16   Cristal Ford, MD  cetirizine (ZYRTEC) 10 MG tablet Take 1 tablet (10 mg total) by mouth daily. 02/11/16   Cristal Ford, MD  desonide (DESOWEN) 0.05 % ointment Apply 1 application topically 2 (two) times daily. Apply to face/neck once daily avoiding eyes as directed 02/12/16   Cristal Ford, MD  Efinaconazole 10 % SOLN Apply 1 application topically daily. 05/22/16   Everlene Farrier, PA-C  EPINEPHrine 0.3 mg/0.3 mL IJ SOAJ injection Inject 0.3 mLs (0.3 mg total) into the muscle once. 02/11/16   Cristal Ford, MD  fluticasone [facility]) 50 MCG/ACT nasal spray Place 2 sprays into both nostrils daily as needed for allergies or rhinitis. 02/11/16   Cristal Ford, MD  HYDROcodone-acetaminophen (NORCO/VICODIN) 5-325 MG tablet Take 1 tablet by mouth every 6 (six) hours as needed for moderate pain. 03/26/16   Charlestine Night, PA-C  mometasone (ELOCON) 0.1 % ointment Apply to areas below face/neck once daily as directed 02/12/16   Cristal Ford, MD  triamcinolone cream (KENALOG) 0.1 % Apply 1 application topically 2 (two) times daily.  01/21/16 01/20/17  Historical Provider, MD    Family History Family History  Problem Relation Age of Onset  . Asthma Mother   . Eczema Mother   . Allergic rhinitis Mother   . Angioedema Mother   .  Asthma Brother   . Asthma Brother   . Allergic rhinitis Brother   . Urticaria Neg Hx   . Immunodeficiency Neg Hx     Social History Social History  Substance Use Topics  . Smoking status: Never Smoker  . Smokeless tobacco: Never Used  . Alcohol use No     Allergies   Banana; Peach flavor; and Shellfish allergy   Review of Systems Review of Systems  Constitutional: Negative for fever.  HENT:  Negative for rhinorrhea and sore throat.   Eyes: Negative for redness.  Respiratory: Negative for cough.   Cardiovascular: Negative for chest pain.  Gastrointestinal: Negative for abdominal pain, diarrhea, nausea and vomiting.  Genitourinary: Positive for difficulty urinating, dysuria, frequency and urgency. Negative for decreased urine volume, flank pain, hematuria, pelvic pain, vaginal bleeding and vaginal discharge.  Musculoskeletal: Negative for myalgias.  Skin: Negative for rash.  Neurological: Negative for headaches.     Physical Exam Updated Vital Signs BP 115/86 (BP Location: Right Arm)   Pulse 66   Temp 98.1 F (36.7 C) (Oral)   Resp 20   Ht 5\' 2"  (1.575 m)   Wt 81.6 kg   LMP 07/10/2016   SpO2 100%   BMI 32.92 kg/m   Physical Exam  Constitutional: She appears well-developed and well-nourished.  HENT:  Head: Normocephalic and atraumatic.  Eyes: Conjunctivae are normal. Right eye exhibits no discharge. Left eye exhibits no discharge.  Neck: Normal range of motion. Neck supple.  Cardiovascular: Normal rate, regular rhythm and normal heart sounds.  Exam reveals no friction rub.   No murmur heard. Pulmonary/Chest: Effort normal and breath sounds normal. No respiratory distress. She has no wheezes. She has no rales.  Abdominal: Soft. There is no tenderness.  Genitourinary: Pelvic exam was performed with patient supine. There is no rash or tenderness on the right labia. There is no rash or tenderness on the left labia. Uterus is not tender. Cervix exhibits no motion tenderness and no discharge. Right adnexum displays tenderness. Right adnexum displays no mass. Left adnexum displays tenderness. Left adnexum displays no mass. No signs of injury around the vagina. Vaginal discharge (Scant white) found.  Genitourinary Comments: Patient with generalized tenderness with insertion of speculum and with bimanual. Likely more related to discomfort from pelvic exam than infection.     Neurological: She is alert.  Skin: Skin is warm and dry.  Psychiatric: She has a normal mood and affect.  Nursing note and vitals reviewed.    ED Treatments / Results  Labs (all labs ordered are listed, but only abnormal results are displayed) Labs Reviewed  WET PREP, GENITAL - Abnormal; Notable for the following:       Result Value   WBC, Wet Prep HPF POC FEW (*)    All other components within normal limits  URINALYSIS, ROUTINE W REFLEX MICROSCOPIC (NOT AT [facility])  PREGNANCY, URINE  GC/CHLAMYDIA PROBE AMP ([place]) NOT AT [facility]   ED ECG REPORT   Date: 07/19/2016  Rate: 51  Rhythm: sinus bradycardia  QRS Axis: normal  Intervals: normal  ST/T Wave abnormalities: normal  Conduction Disutrbances:none  Narrative Interpretation:   Old EKG Reviewed: none available  I have personally reviewed the EKG tracing and agree with the computerized printout as noted.    Procedures Procedures (including critical care time)  Medications Ordered in ED Medications  lidocaine (PF) (XYLOCAINE) 1 % injection (not administered)  cefTRIAXone (ROCEPHIN) injection 250 mg (250 mg Intramuscular Given 07/19/16 1117)  azithromycin (ZITHROMAX)  200 MG/5ML suspension 1,000 mg (1,000 mg Oral Given 07/19/16 1241)     Initial Impression / Assessment and Plan / ED Course  I have reviewed the triage vital signs and the nursing notes.  Pertinent labs & imaging results that were available during my care of the patient were reviewed by me and considered in my medical decision making (see chart for details).  Clinical Course   10:41 AM Patient seen and examined. Pelvic exam performed with nurse chaperone (RN Ross). Pending completion of work-up.   1:18 PM patient updated on results. EKG is normal. Patient treated with IM ceftriaxone and by mouth azithromycin in the emergency department.  GYN follow-up given and encouraged. Patient encouraged to return with worsening or changing symptoms, severe  pain, fever, or other concerns. She verbalizes understanding and agrees with plan.  Final Clinical Impressions(s) / ED Diagnoses   Final diagnoses:  Bilateral lower abdominal cramping  Chest pain of uncertain etiology   Bilateral lower abdominal cramping: Patient has irritative urinary symptoms. UA does not demonstrate cystitis. Pelvic exam performed. GC/Chlamydia test are pending. Patient covered with abx. No concern for PID given exam and history.    CP: Chest pain is atypical for ACS or PE. EKG without ischemic changes, signs of prolonged QT interval, Brugada syndrome, Wolff-Parkinson-White. No concerning family history. Encouraged PCP follow-up as needed.  New Prescriptions New Prescriptions   No medications on file     Renne Crigler, PA-C 07/19/16 1320    Lavera Guise, MD 07/20/16 [phone]

## 2016-07-19 NOTE — ED Triage Notes (Signed)
Pt. Stated, Alyssa [name] got this lower pain and when I pee it hurts. This is been going on for 2 weeks.

## 2016-07-19 NOTE — Discharge Instructions (Signed)
Please read and follow all provided instructions.  Your diagnoses today include:  1. Bilateral lower abdominal cramping     Tests performed today include: Urine test to look for infection and pregnancy (in women) - no infection and negative pregnancy Wet prep - no vaginal infection Vital signs. See below for your results today.   Medications prescribed:  None  Take any prescribed medications only as directed.  Home care instructions:  Follow any educational materials contained in this packet.  Follow-up instructions: Please follow-up with the gynecologist listed in the next 1 week for further evaluation of your symptoms.    Return instructions:  SEEK IMMEDIATE MEDICAL ATTENTION IF: The pain does not go away or becomes severe  A temperature above 101F develops  Repeated vomiting occurs (multiple episodes)  The pain becomes localized to portions of the abdomen. The right side could possibly be appendicitis. In an adult, the left lower portion of the abdomen could be colitis or diverticulitis.  Blood is being passed in stools or vomit (bright red or black tarry stools)  You develop chest pain, difficulty breathing, dizziness or fainting, or become confused, poorly responsive, or inconsolable (young children) If you have any other emergent concerns regarding your health  Additional Information: Abdominal (belly) pain can be caused by many things. Your caregiver performed an examination and possibly ordered blood/urine tests and imaging (CT scan, x-rays, ultrasound). Many cases can be observed and treated at home after initial evaluation in the emergency department. Even though you are being discharged home, abdominal pain can be unpredictable. Therefore, you need a repeated exam if your pain does not resolve, returns, or worsens. Most patients with abdominal pain don't have to be admitted to the hospital or have surgery, but serious problems like appendicitis and gallbladder attacks can  start out as nonspecific pain. Many abdominal conditions cannot be diagnosed in one visit, so follow-up evaluations are very important.  Your vital signs today were: BP 115/86 (BP Location: Right Arm)    Pulse 66    Temp 98.1 F (36.7 C) (Oral)    Resp 20    Ht 5\' 2"  (1.575 m)    Wt 81.6 kg    LMP 07/10/2016    SpO2 100%    BMI 32.92 kg/m  If your blood pressure (bp) was elevated above 135/85 this visit, please have this repeated by your doctor within one month. --------------

## 2016-07-21 LAB — GC/CHLAMYDIA PROBE AMP (~~LOC~~) NOT AT ARMC
Chlamydia: NEGATIVE
Neisseria Gonorrhea: NEGATIVE

## 2016-10-02 ENCOUNTER — Ambulatory Visit (HOSPITAL_COMMUNITY)
Admission: EM | Admit: 2016-10-02 | Discharge: 2016-10-02 | Disposition: A | Discharge status: Home / Self Care | Payer: Medicaid Other | Attending: Emergency Medicine | Admitting: Emergency Medicine

## 2016-10-02 ENCOUNTER — Encounter (HOSPITAL_COMMUNITY): Payer: Self-pay | Admitting: Emergency Medicine

## 2016-10-02 ENCOUNTER — Ambulatory Visit (INDEPENDENT_AMBULATORY_CARE_PROVIDER_SITE_OTHER): Payer: Medicaid Other

## 2016-10-02 DIAGNOSIS — R1084 Generalized abdominal pain: Secondary | ICD-10-CM

## 2016-10-02 DIAGNOSIS — R300 Dysuria: Secondary | ICD-10-CM | POA: Diagnosis not present

## 2016-10-02 DIAGNOSIS — Z8489 Family history of other specified conditions: Secondary | ICD-10-CM | POA: Diagnosis not present

## 2016-10-02 DIAGNOSIS — N898 Other specified noninflammatory disorders of vagina: Secondary | ICD-10-CM

## 2016-10-02 DIAGNOSIS — R143 Flatulence: Secondary | ICD-10-CM

## 2016-10-02 DIAGNOSIS — L209 Atopic dermatitis, unspecified: Secondary | ICD-10-CM | POA: Diagnosis not present

## 2016-10-02 DIAGNOSIS — Z79899 Other long term (current) drug therapy: Secondary | ICD-10-CM | POA: Insufficient documentation

## 2016-10-02 DIAGNOSIS — F909 Attention-deficit hyperactivity disorder, unspecified type: Secondary | ICD-10-CM | POA: Diagnosis not present

## 2016-10-02 DIAGNOSIS — Z825 Family history of asthma and other chronic lower respiratory diseases: Secondary | ICD-10-CM | POA: Diagnosis not present

## 2016-10-02 DIAGNOSIS — R1032 Left lower quadrant pain: Secondary | ICD-10-CM | POA: Insufficient documentation

## 2016-10-02 DIAGNOSIS — J45909 Unspecified asthma, uncomplicated: Secondary | ICD-10-CM | POA: Insufficient documentation

## 2016-10-02 LAB — POCT URINALYSIS DIP (DEVICE)
BILIRUBIN URINE: NEGATIVE
Glucose, UA: NEGATIVE mg/dL
HGB URINE DIPSTICK: NEGATIVE
KETONES UR: NEGATIVE mg/dL
LEUKOCYTES UA: NEGATIVE
Nitrite: NEGATIVE
PH: 7 (ref 5.0–8.0)
Protein, ur: NEGATIVE mg/dL
Specific Gravity, Urine: 1.015 (ref 1.005–1.030)
Urobilinogen, UA: 0.2 mg/dL (ref 0.0–1.0)

## 2016-10-02 LAB — POCT PREGNANCY, URINE: Preg Test, Ur: NEGATIVE

## 2016-10-02 NOTE — ED Notes (Signed)
Call back number verified and updated in EPIC... Adv pt to not have SI until lab results comeback neg.... Also adv pt lab results will be on MyChart; instructions given .... Pt verb understanding.   

## 2016-10-02 NOTE — ED Triage Notes (Signed)
[facility] a [facility] pain for 2 weeks. The patient reported that she has had lower abdominal pain that she stated was a cramping and bloated feeling that she believed to be gas. The patient stated that she has had a bowel movement but it was loose. The patient also requested a pregnancy test.

## 2016-10-02 NOTE — ED Provider Notes (Signed)
[facility]    CSN: [id number] Arrival date & time: 10/02/16  1030     History   Chief Complaint Chief Complaint  Patient presents with  . Abdominal Pain    HPI Alyssa [name] is a 20 y.o. female.20 y.o. female.   HPI She is a 20 year old woman here for evaluation of abdominal pain. She reports a 1-2 week history of abdominal discomfort. She describes it as bloating and gas. It has gotten slightly worse over the last week. No nausea or vomiting. She has had loose stools for the last week. She has a bowel movement every 2-3 days. Her abdominal pain is relieved temporarily by passing gas and having bowel movements. No fevers. She does report some mild dysuria, but no frequency or urgency. She also reports a vaginal discharge with some odor to it.  She is requesting a pregnancy test today. She reports having 2 periods in the last month. She has also had pelvic cramping for the last month. She is sexually active with one known partner. She is not on birth control. They do not use condoms.  Past Medical History:  Diagnosis Date  . ADHD (attention deficit hyperactivity disorder)   . Angio-edema   . Asthma   . Eczema   . Recurrent upper respiratory infection (URI)   . Urticaria     Patient Active Problem List   Diagnosis Date Noted  . Food allergy 02/11/2016  . Seasonal and perennial allergic rhinoconjunctivitis 02/11/2016  . Atopic dermatitis 02/11/2016  . Intermittent coughing/wheezing 02/11/2016    Past Surgical History:  Procedure Laterality Date  . WISDOM TOOTH EXTRACTION      OB History    No data available       Home Medications    Prior to Admission medications   Medication Sig Start Date End Date Taking? Authorizing Provider  albuterol (PROAIR HFA) 108 (90 Base) MCG/ACT inhaler TWO PUFFS EVERY 4-6 HOURS AS NEEDED FOR COUGH OR WHEEZE. 02/11/16   Cristal Fordalph Carter Bobbitt, MD  cetirizine (ZYRTEC) 10 MG tablet Take 1 tablet (10 mg total) by mouth daily.  02/11/16   Cristal Fordalph Carter Bobbitt, MD  desonide (DESOWEN) 0.05 % ointment Apply 1 application topically 2 (two) times daily. Apply to face/neck once daily avoiding eyes as directed 02/12/16   Cristal Fordalph Carter Bobbitt, MD  Efinaconazole 10 % SOLN Apply 1 application topically daily. 05/22/16   Everlene FarrierWilliam Dansie, PA-C  EPINEPHrine 0.3 mg/0.3 mL IJ SOAJ injection Inject 0.3 mLs (0.3 mg total) into the muscle once. 02/11/16   Cristal Fordalph Carter Bobbitt, MD  fluticasone [facility](FLONASE) 50 MCG/ACT nasal spray Place 2 sprays into both nostrils daily as needed for allergies or rhinitis. 02/11/16   Cristal Fordalph Carter Bobbitt, MD  HYDROcodone-acetaminophen (NORCO/VICODIN) 5-325 MG tablet Take 1 tablet by mouth every 6 (six) hours as needed for moderate pain. 03/26/16   Charlestine Nighthristopher Lawyer, PA-C  mometasone (ELOCON) 0.1 % ointment Apply to areas below face/neck once daily as directed 02/12/16   Cristal Fordalph Carter Bobbitt, MD  triamcinolone cream (KENALOG) 0.1 % Apply 1 application topically 2 (two) times daily.  01/21/16 01/20/17  Historical Provider, MD    Family History Family History  Problem Relation Age of Onset  . Asthma Mother   . Eczema Mother   . Allergic rhinitis Mother   . Angioedema Mother   . Asthma Brother   . Asthma Brother   . Allergic rhinitis Brother   . Urticaria Neg Hx   . Immunodeficiency Neg Hx     Social History  Social History  Substance Use Topics  . Smoking status: Never Smoker  . Smokeless tobacco: Never Used  . Alcohol use No     Allergies   Banana; Peach flavor; and Shellfish allergy   Review of Systems Review of Systems As in history of present illness  Physical Exam Triage Vital Signs ED Triage Vitals  Enc Vitals Group     BP 10/02/16 1121 129/83     Pulse Rate 10/02/16 1121 76     Resp 10/02/16 1121 16     Temp 10/02/16 1121 98.2 F (36.8 C)     Temp Source 10/02/16 1121 Oral     SpO2 10/02/16 1121 100 %     Weight --      Height --      Head Circumference --      Peak Flow --       Pain Score 10/02/16 1126 6     Pain Loc --      Pain Edu? --      Excl. in GC? --    No data found.   Updated Vital Signs BP 129/83 (BP Location: Left Arm)   Pulse 76   Temp 98.2 F (36.8 C) (Oral)   Resp 16   LMP 09/20/2016 (Exact Date)   SpO2 100%   Visual Acuity Right Eye Distance:   Left Eye Distance:   Bilateral Distance:    Right Eye Near:   Left Eye Near:    Bilateral Near:     Physical Exam  Constitutional: She is oriented to person, place, and time. She appears well-developed and well-nourished. No distress.  Cardiovascular: Normal rate.   Pulmonary/Chest: Effort normal.  Abdominal: Soft. She exhibits no distension and no mass. There is no tenderness. There is no rebound and no guarding.  Neurological: She is alert and oriented to person, place, and time.     UC Treatments / Results  Labs (all labs ordered are listed, but only abnormal results are displayed) Labs Reviewed  POCT URINALYSIS DIP (DEVICE)  POCT PREGNANCY, URINE  URINE CYTOLOGY ANCILLARY ONLY    EKG  EKG Interpretation None       Radiology Dg Abd 1 View  Result Date: 10/02/2016 CLINICAL DATA:  The lower left abdominal pain EXAM: ABDOMEN - 1 VIEW COMPARISON:  None. FINDINGS: Scattered large and small bowel gas is noted. No obstructive changes are seen. No free air is noted. No acute bony abnormality is seen. IMPRESSION: No acute abnormality noted. Electronically Signed   By: Alcide Clever M.D.   On: 10/02/2016 12:51    Procedures Procedures (including critical care time)  Medications Ordered in UC Medications - No data to display   Initial Impression / Assessment and Plan / UC Course  I have reviewed the triage vital signs and the nursing notes.  Pertinent labs & imaging results that were available during my care of the patient were reviewed by me and considered in my medical decision making (see chart for details).  Clinical Course     We'll treat abdominal pain with MiraLAX  as this is likely due to gas and some fecaliths. Urine cytology sent for gonorrhea, chlamydia, and BV. Will treat based on results. Follow-up if symptoms are not improving in 1 week.  Final Clinical Impressions(s) / UC Diagnoses   Final diagnoses:  Generalized abdominal pain  Excessive flatus  Vaginal discharge    New Prescriptions New Prescriptions   No medications on file     Caylor Tallarico J  Piedad ClimesHonig, MD 10/02/16 [phone]

## 2016-10-02 NOTE — Discharge Instructions (Signed)
Your x-ray shows a lot of gas in your intestines. It also shows some balls of stool. Get some MiraLAX at the drug store. Take this twice a day. This will give you some diarrhea. If the bloated feeling does not resolve after one week, please follow-up with your primary care doctor.  We are checking your urine for infections that may be causing your discharge. We will call you with the results in 2-3 days.

## 2016-10-03 LAB — URINE CYTOLOGY ANCILLARY ONLY
CHLAMYDIA, DNA PROBE: NEGATIVE
Neisseria Gonorrhea: NEGATIVE
Trichomonas: NEGATIVE

## 2016-10-06 ENCOUNTER — Telehealth (HOSPITAL_COMMUNITY): Payer: Self-pay | Admitting: Emergency Medicine

## 2016-10-06 LAB — URINE CYTOLOGY ANCILLARY ONLY

## 2016-10-06 NOTE — Telephone Encounter (Signed)
-----   Message from Charm RingsErin J Honig, MD sent at 10/03/2016  5:04 PM EST ----- Please notify patient of negative STD testing.  If her abdominal pain persists, she should f/u with her PCP.  EH

## 2016-10-06 NOTE — Telephone Encounter (Signed)
Called [phone] but no answer.  Need to give lab results and to see how pt is doing from recent visit on 10/02/16

## 2016-10-08 MED ORDER — METRONIDAZOLE 500 MG PO TABS: 500.0000 mg | ORAL_TABLET | Freq: Two times a day (BID) | ORAL | 0 refills | Status: DC

## 2016-10-08 NOTE — Telephone Encounter (Signed)
Please let patient know that est for gardnerella (bacterial vaginosis) was positive.  Bacterial vaginosis can cause vaginal irritation/discharge.  Will send prescription for metronidazole to the pharmacy of record, [facility] on [address].  Recheck as needed for further evaluation if symptoms persist.  LM

## 2016-10-10 NOTE — Telephone Encounter (Signed)
-----   Message from Eustace MooreLaura W Murray, MD sent at 10/08/2016 10:51 PM EST ----- Clinical staff, please let patient know that test for gardnerella (bacterial vaginosis) was positive.  Bacterial vaginosis can cause vaginal irritation/discharge.  Will send prescription for metronidazole to the pharmacy of record, [facility] on [address].  Recheck as needed for further evaluation if symptoms persist.  LM

## 2016-10-10 NOTE — Telephone Encounter (Signed)
2nd attempt... Called [phone] but no answer.  Need to give lab results and to see how pt is doing from recent visit on 10/02/16

## 2016-10-10 NOTE — Telephone Encounter (Signed)
3rd attempt... Called [phone] but no answer.  Need to give lab results and to see how pt is doing from recent visit on 10/02/16

## 2016-12-29 ENCOUNTER — Encounter (HOSPITAL_COMMUNITY): Payer: Self-pay | Admitting: *Deleted

## 2016-12-29 ENCOUNTER — Emergency Department (HOSPITAL_COMMUNITY)
Admission: EM | Admit: 2016-12-29 | Discharge: 2016-12-29 | Disposition: A | Discharge status: Home / Self Care | Payer: Medicaid Other | Attending: Dermatology | Admitting: Dermatology

## 2016-12-29 DIAGNOSIS — Z113 Encounter for screening for infections with a predominantly sexual mode of transmission: Secondary | ICD-10-CM | POA: Insufficient documentation

## 2016-12-29 DIAGNOSIS — F909 Attention-deficit hyperactivity disorder, unspecified type: Secondary | ICD-10-CM | POA: Insufficient documentation

## 2016-12-29 DIAGNOSIS — J45909 Unspecified asthma, uncomplicated: Secondary | ICD-10-CM | POA: Insufficient documentation

## 2016-12-29 DIAGNOSIS — Z5321 Procedure and treatment not carried out due to patient leaving prior to being seen by health care provider: Secondary | ICD-10-CM | POA: Diagnosis not present

## 2016-12-29 NOTE — ED Triage Notes (Signed)
Pt reports large amt of clear discharge onset x 2 wks, pt reports suprapubic cramping & "bubbly", pt states, " I always throw up, but they cannot tell me why." pt denies painful intercourse, pt denies vaginal bleeding, A&O x4

## 2017-01-05 ENCOUNTER — Encounter (HOSPITAL_COMMUNITY): Payer: Self-pay | Admitting: Emergency Medicine

## 2017-01-05 ENCOUNTER — Emergency Department (HOSPITAL_COMMUNITY)
Admission: EM | Admit: 2017-01-05 | Discharge: 2017-01-05 | Disposition: A | Discharge status: Home / Self Care | Payer: Medicaid Other | Attending: Emergency Medicine | Admitting: Emergency Medicine

## 2017-01-05 DIAGNOSIS — F909 Attention-deficit hyperactivity disorder, unspecified type: Secondary | ICD-10-CM | POA: Diagnosis not present

## 2017-01-05 DIAGNOSIS — J45909 Unspecified asthma, uncomplicated: Secondary | ICD-10-CM | POA: Diagnosis not present

## 2017-01-05 DIAGNOSIS — N760 Acute vaginitis: Secondary | ICD-10-CM | POA: Insufficient documentation

## 2017-01-05 DIAGNOSIS — N898 Other specified noninflammatory disorders of vagina: Secondary | ICD-10-CM | POA: Diagnosis present

## 2017-01-05 DIAGNOSIS — B9689 Other specified bacterial agents as the cause of diseases classified elsewhere: Secondary | ICD-10-CM

## 2017-01-05 LAB — WET PREP, GENITAL
SPERM: NONE SEEN
Trich, Wet Prep: NONE SEEN
Yeast Wet Prep HPF POC: NONE SEEN

## 2017-01-05 LAB — POC URINE PREG, ED: Preg Test, Ur: NEGATIVE

## 2017-01-05 MED ORDER — CEFTRIAXONE SODIUM 250 MG IJ SOLR
250.0000 mg | Freq: Once | INTRAMUSCULAR | Status: AC
Start: 2017-01-05 — End: 2017-01-05
  Administered 2017-01-05: 250 mg via INTRAMUSCULAR
  Filled 2017-01-05: qty 250

## 2017-01-05 MED ORDER — AZITHROMYCIN 250 MG PO TABS
1000.0000 mg | ORAL_TABLET | Freq: Once | ORAL | Status: AC
  Administered 2017-01-05: 1000 mg via ORAL
  Filled 2017-01-05: qty 4

## 2017-01-05 MED ORDER — METRONIDAZOLE 500 MG PO TABS: 500.0000 mg | ORAL_TABLET | Freq: Two times a day (BID) | ORAL | 0 refills | Status: DC

## 2017-01-05 MED ORDER — LIDOCAINE HCL (PF) 1 % IJ SOLN
INTRAMUSCULAR | Status: AC
  Administered 2017-01-05: 5 mL
  Filled 2017-01-05: qty 5

## 2017-01-05 NOTE — ED Triage Notes (Signed)
Pt sts vaginal discharge and itching and wants to be  Checked for STD

## 2017-01-05 NOTE — ED Provider Notes (Signed)
[facility]   CSN: [id number] Arrival date & time: 01/05/17  1229     History   Chief Complaint Chief Complaint  Patient presents with  . Vaginal Discharge    HPI Alyssa [name] is a 21 y.o. female.  The history is provided by the patient.  Vaginal Discharge   This is a new problem. The current episode started more than 1 week ago. The problem occurs constantly. The problem has been gradually worsening. The discharge occurs spontaneously. The discharge was white, copious and watery. She is not pregnant. She has not missed her period. Associated symptoms include frequency and genital itching. Pertinent negatives include no anorexia, no abdominal pain, no diarrhea, no nausea, no vomiting and no genital burning. Associated symptoms comments: 2 sexual partners for months without protection.  Unsure if partners are symptomatic. She has tried nothing for the symptoms. The treatment provided no relief. Her past medical history is significant for STD.    Past Medical History:  Diagnosis Date  . ADHD (attention deficit hyperactivity disorder)   . Angio-edema   . Asthma   . Eczema   . Recurrent upper respiratory infection (URI)   . Urticaria     Patient Active Problem List   Diagnosis Date Noted  . Food allergy 02/11/2016  . Seasonal and perennial allergic rhinoconjunctivitis 02/11/2016  . Atopic dermatitis 02/11/2016  . Intermittent coughing/wheezing 02/11/2016    Past Surgical History:  Procedure Laterality Date  . WISDOM TOOTH EXTRACTION      OB History    No data available       Home Medications    Prior to Admission medications   Medication Sig Start Date End Date Taking? Authorizing Provider  albuterol (PROAIR HFA) 108 (90 Base) MCG/ACT inhaler TWO PUFFS EVERY 4-6 HOURS AS NEEDED FOR COUGH OR WHEEZE. 02/11/16   Cristal Fordalph Carter Bobbitt, MD  cetirizine (ZYRTEC) 10 MG tablet Take 1 tablet (10 mg total) by mouth daily. 02/11/16   Cristal Fordalph  Carter Bobbitt, MD  desonide (DESOWEN) 0.05 % ointment Apply 1 application topically 2 (two) times daily. Apply to face/neck once daily avoiding eyes as directed 02/12/16   Cristal Fordalph Carter Bobbitt, MD  Efinaconazole 10 % SOLN Apply 1 application topically daily. 05/22/16   Everlene FarrierWilliam Dansie, PA-C  EPINEPHrine 0.3 mg/0.3 mL IJ SOAJ injection Inject 0.3 mLs (0.3 mg total) into the muscle once. 02/11/16   Cristal Fordalph Carter Bobbitt, MD  fluticasone [facility](FLONASE) 50 MCG/ACT nasal spray Place 2 sprays into both nostrils daily as needed for allergies or rhinitis. 02/11/16   Cristal Fordalph Carter Bobbitt, MD  HYDROcodone-acetaminophen (NORCO/VICODIN) 5-325 MG tablet Take 1 tablet by mouth every 6 (six) hours as needed for moderate pain. 03/26/16   Charlestine Nighthristopher Lawyer, PA-C  metroNIDAZOLE (FLAGYL) 500 MG tablet Take 1 tablet (500 mg total) by mouth 2 (two) times daily. 10/08/16   Eustace MooreLaura W Murray, MD  mometasone (ELOCON) 0.1 % ointment Apply to areas below face/neck once daily as directed 02/12/16   Cristal Fordalph Carter Bobbitt, MD  triamcinolone cream (KENALOG) 0.1 % Apply 1 application topically 2 (two) times daily.  01/21/16 01/20/17  Historical Provider, MD    Family History Family History  Problem Relation Age of Onset  . Asthma Mother   . Eczema Mother   . Allergic rhinitis Mother   . Angioedema Mother   . Asthma Brother   . Asthma Brother   . Allergic rhinitis Brother   . Urticaria Neg Hx   . Immunodeficiency Neg Hx  Social History Social History  Substance Use Topics  . Smoking status: Never Smoker  . Smokeless tobacco: Never Used  . Alcohol use No     Allergies   Banana; Peach flavor; and Shellfish allergy   Review of Systems Review of Systems  Gastrointestinal: Negative for abdominal pain, anorexia, diarrhea, nausea and vomiting.  Genitourinary: Positive for frequency and vaginal discharge.  All other systems reviewed and are negative.    Physical Exam Updated Vital Signs BP 123/67   Pulse 70   Temp 98.2  F (36.8 C) (Oral)   Resp 16   Ht 5\' 2"  (1.575 m)   Wt 180 lb (81.6 kg)   LMP 12/17/2016   SpO2 100%   BMI 32.92 kg/m   Physical Exam  Constitutional: She is oriented to person, place, and time. She appears well-developed and well-nourished. No distress.  HENT:  Head: Normocephalic and atraumatic.  Mouth/Throat: Oropharynx is clear and moist.  Eyes: Conjunctivae and EOM are normal. Pupils are equal, round, and reactive to light.  Neck: Normal range of motion. Neck supple.  Cardiovascular: Normal rate, regular rhythm and intact distal pulses.   No murmur heard. Pulmonary/Chest: Effort normal and breath sounds normal. No respiratory distress. She has no wheezes. She has no rales.  Abdominal: Soft. She exhibits no distension. There is no tenderness. There is no rebound and no guarding.  Genitourinary: Uterus normal. Cervix exhibits discharge. Cervix exhibits no motion tenderness and no friability. Right adnexum displays no mass, no tenderness and no fullness. Left adnexum displays no mass, no tenderness and no fullness. No tenderness or bleeding in the vagina. Vaginal discharge found.  Musculoskeletal: Normal range of motion. She exhibits no edema or tenderness.  Neurological: She is alert and oriented to person, place, and time.  Skin: Skin is warm and dry. No rash noted. No erythema.  Psychiatric: She has a normal mood and affect. Her behavior is normal.  Nursing note and vitals reviewed.    ED Treatments / Results  Labs (all labs ordered are listed, but only abnormal results are displayed) Labs Reviewed  WET PREP, GENITAL - Abnormal; Notable for the following:       Result Value   Clue Cells Wet Prep HPF POC PRESENT (*)    WBC, Wet Prep HPF POC MANY (*)    All other components within normal limits  URINALYSIS, ROUTINE W REFLEX MICROSCOPIC  HIV ANTIBODY (ROUTINE TESTING)  RPR  POC URINE PREG, ED  GC/CHLAMYDIA PROBE AMP ([place]) NOT AT [facility]    EKG  EKG  Interpretation None       Radiology No results found.  Procedures Procedures (including critical care time)  Medications Ordered in ED Medications  cefTRIAXone (ROCEPHIN) injection 250 mg (not administered)  azithromycin (ZITHROMAX) tablet 1,000 mg (not administered)     Initial Impression / Assessment and Plan / ED Course  I have reviewed the triage vital signs and the nursing notes.  Pertinent labs & imaging results that were available during my care of the patient were reviewed by me and considered in my medical decision making (see chart for details).    Patient presenting with possible STD. She has had increased vaginal discharge for the last week with some mild itching. She is at risk for STD as she has 2 partners and does not use protection. Urine pregnancy test is negative. She has no symptoms suggestive of PID. Swabs collected and patient treated with Rocephin and azithromycin. HIV and RPR pending  7:19  PM Wet prep with clue cells and may be symptomatic BV.  Treated with flagyl.  Final Clinical Impressions(s) / ED Diagnoses   Final diagnoses:  Vaginal discharge  BV (bacterial vaginosis)    New Prescriptions New Prescriptions   METRONIDAZOLE (FLAGYL) 500 MG TABLET    Take 1 tablet (500 mg total) by mouth 2 (two) times daily.     Gwyneth Sprout, MD 01/05/17 1920

## 2017-01-06 LAB — GC/CHLAMYDIA PROBE AMP (~~LOC~~) NOT AT ARMC
CHLAMYDIA, DNA PROBE: NEGATIVE
Neisseria Gonorrhea: NEGATIVE

## 2017-01-06 LAB — HIV ANTIBODY (ROUTINE TESTING W REFLEX): HIV Screen 4th Generation wRfx: NONREACTIVE

## 2017-01-06 LAB — RPR: RPR: NONREACTIVE

## 2017-04-15 ENCOUNTER — Emergency Department (HOSPITAL_COMMUNITY)
Admission: EM | Admit: 2017-04-15 | Discharge: 2017-04-15 | Disposition: A | Discharge status: Home / Self Care | Payer: Medicaid Other | Attending: Emergency Medicine | Admitting: Emergency Medicine

## 2017-04-15 ENCOUNTER — Encounter (HOSPITAL_COMMUNITY): Payer: Self-pay

## 2017-04-15 ENCOUNTER — Other Ambulatory Visit: Payer: Self-pay

## 2017-04-15 ENCOUNTER — Emergency Department (HOSPITAL_COMMUNITY): Payer: Medicaid Other

## 2017-04-15 DIAGNOSIS — J45909 Unspecified asthma, uncomplicated: Secondary | ICD-10-CM | POA: Insufficient documentation

## 2017-04-15 DIAGNOSIS — R079 Chest pain, unspecified: Secondary | ICD-10-CM

## 2017-04-15 DIAGNOSIS — F909 Attention-deficit hyperactivity disorder, unspecified type: Secondary | ICD-10-CM | POA: Insufficient documentation

## 2017-04-15 DIAGNOSIS — R0789 Other chest pain: Secondary | ICD-10-CM | POA: Diagnosis not present

## 2017-04-15 LAB — BASIC METABOLIC PANEL
Anion gap: 7 (ref 5–15)
BUN: <5 mg/dL — ABNORMAL LOW (ref 6–20)
CALCIUM: 8.8 mg/dL — AB (ref 8.9–10.3)
CHLORIDE: 108 mmol/L (ref 101–111)
CO2: 24 mmol/L (ref 22–32)
CREATININE: 0.62 mg/dL (ref 0.44–1.00)
Glucose, Bld: 88 mg/dL (ref 65–99)
Potassium: 3.5 mmol/L (ref 3.5–5.1)
SODIUM: 139 mmol/L (ref 135–145)

## 2017-04-15 LAB — LIPASE, BLOOD: LIPASE: 20 U/L (ref 11–51)

## 2017-04-15 LAB — HEPATIC FUNCTION PANEL
ALBUMIN: 3.6 g/dL (ref 3.5–5.0)
ALT: 13 U/L — ABNORMAL LOW (ref 14–54)
AST: 16 U/L (ref 15–41)
Alkaline Phosphatase: 49 U/L (ref 38–126)
Bilirubin, Direct: <0.1 mg/dL — ABNORMAL LOW (ref 0.1–0.5)
TOTAL PROTEIN: 6.7 g/dL (ref 6.5–8.1)
Total Bilirubin: 0.5 mg/dL (ref 0.3–1.2)

## 2017-04-15 LAB — CBC
HCT: 37.6 % (ref 36.0–46.0)
Hemoglobin: 12.7 g/dL (ref 12.0–15.0)
MCH: 29.1 pg (ref 26.0–34.0)
MCHC: 33.8 g/dL (ref 30.0–36.0)
MCV: 86.2 fL (ref 78.0–100.0)
PLATELETS: 216 10*3/uL (ref 150–400)
RBC: 4.36 MIL/uL (ref 3.87–5.11)
RDW: 12.6 % (ref 11.5–15.5)
WBC: 9.7 10*3/uL (ref 4.0–10.5)

## 2017-04-15 LAB — I-STAT TROPONIN, ED: TROPONIN I, POC: 0 ng/mL (ref 0.00–0.08)

## 2017-04-15 MED ORDER — GI COCKTAIL ~~LOC~~
30.0000 mL | Freq: Once | ORAL | Status: AC
  Administered 2017-04-15: 30 mL via ORAL
  Filled 2017-04-15: qty 30

## 2017-04-15 NOTE — Discharge Instructions (Signed)
Try zantac 150mg twice a day.  ° ° °

## 2017-04-15 NOTE — ED Provider Notes (Signed)
[facility]EMERGENCY DEPT Provider Note   CSN: [id number] Arrival date & time: 04/15/17  1219  By signing my name below, I, Phillips Climes, attest that this documentation has been prepared under the direction and in the presence of Melene Plan, DO . Electronically Signed: Phillips Climes, Scribe. 04/15/2017. 3:36 PM.  History   Chief Complaint Chief Complaint  Patient presents with  . Chest Pain   HPI Comments Alyssa [name] is a 21 y.o. female with a PMHx significant for ADHD, asthma and recurrent URIs, who presents to the Emergency Department with complaints of sudden onset epigastric chest pain, described as pressure, x1 week. She rates this a 7/10 in severity. Pt states that it feels like gas, but is worsened with exertion. She reports N/V at her baseline. No leg swelling or hemoptysis. Pt not on BC. No long periods of immobilization. No hx of cancer. No FMHx of MI, PE or DVT.  Pt denies experiencing any other acute sx, including chills, fever, shortness of breath, headaches or dizziness.   The history is provided by the patient and medical records. No language interpreter was used.   Past Medical History:  Diagnosis Date  . ADHD (attention deficit hyperactivity disorder)   . Angio-edema   . Asthma   . Eczema   . Recurrent upper respiratory infection (URI)   . Urticaria     Patient Active Problem List   Diagnosis Date Noted  . Food allergy 02/11/2016  . Seasonal and perennial allergic rhinoconjunctivitis 02/11/2016  . Atopic dermatitis 02/11/2016  . Intermittent coughing/wheezing 02/11/2016    Past Surgical History:  Procedure Laterality Date  . WISDOM TOOTH EXTRACTION      OB History    No data available       Home Medications    Prior to Admission medications   Medication Sig Start Date End Date Taking? Authorizing Provider  albuterol (PROAIR HFA) 108 (90 Base) MCG/ACT inhaler TWO PUFFS EVERY 4-6 HOURS AS NEEDED FOR COUGH OR WHEEZE. 02/11/16   Bobbitt,  Heywood Iles, MD  cetirizine (ZYRTEC) 10 MG tablet Take 1 tablet (10 mg total) by mouth daily. 02/11/16   Bobbitt, Heywood Iles, MD  desonide (DESOWEN) 0.05 % ointment Apply 1 application topically 2 (two) times daily. Apply to face/neck once daily avoiding eyes as directed 02/12/16   Bobbitt, Heywood Iles, MD  Efinaconazole 10 % SOLN Apply 1 application topically daily. 05/22/16   Everlene Farrier, PA-C  EPINEPHrine 0.3 mg/0.3 mL IJ SOAJ injection Inject 0.3 mLs (0.3 mg total) into the muscle once. 02/11/16   Bobbitt, Heywood Iles, MD  fluticasone (FLONASE) 50 MCG/ACT nasal spray Place 2 sprays into both nostrils daily as needed for allergies or rhinitis. 02/11/16   Bobbitt, Heywood Iles, MD  HYDROcodone-acetaminophen (NORCO/VICODIN) 5-325 MG tablet Take 1 tablet by mouth every 6 (six) hours as needed for moderate pain. 03/26/16   Lawyer, Cristal Deer, PA-C  metroNIDAZOLE (FLAGYL) 500 MG tablet Take 1 tablet (500 mg total) by mouth 2 (two) times daily. 01/05/17   Gwyneth Sprout, MD  mometasone (ELOCON) 0.1 % ointment Apply to areas below face/neck once daily as directed 02/12/16   Bobbitt, Heywood Iles, MD    Family History Family History  Problem Relation Age of Onset  . Asthma Mother   . Eczema Mother   . Allergic rhinitis Mother   . Angioedema Mother   . Asthma Brother   . Asthma Brother   . Allergic rhinitis Brother   . Urticaria Neg Hx   .  Immunodeficiency Neg Hx     Social History Social History  Substance Use Topics  . Smoking status: Never Smoker  . Smokeless tobacco: Never Used  . Alcohol use No     Allergies   Banana; Peach flavor; and Shellfish allergy  Review of Systems Review of Systems  Constitutional: Negative for chills and fever.  HENT: Negative for congestion and rhinorrhea.   Eyes: Negative for redness and visual disturbance.  Respiratory: Negative for cough, shortness of breath and wheezing.   Cardiovascular: Positive for chest pain. Negative for palpitations  and leg swelling.  Gastrointestinal: Positive for nausea (Pt baseline) and vomiting (Pt baseline).  Genitourinary: Negative for dysuria and urgency.  Musculoskeletal: Negative for arthralgias and myalgias.  Skin: Negative for pallor and wound.  Neurological: Negative for dizziness and headaches.  All other systems reviewed and are negative.  Physical Exam Updated Vital Signs BP (!) 127/92 (BP Location: Right Arm)   Pulse 65   Temp 98.6 F (37 C) (Oral)   Resp 16   Ht 5' 2.75" (1.594 m)   Wt 81.6 kg (180 lb)   LMP 04/03/2017   SpO2 98%   BMI 32.14 kg/m   Physical Exam  Constitutional: She is oriented to person, place, and time. She appears well-developed and well-nourished. No distress.  HENT:  Head: Normocephalic and atraumatic.  Eyes: EOM are normal. Pupils are equal, round, and reactive to light.  Neck: Normal range of motion. Neck supple.  Cardiovascular: Normal rate and regular rhythm.  Exam reveals no gallop and no friction rub.   No murmur heard. Pulmonary/Chest: Effort normal. She has no wheezes. She has no rales.  Abdominal: Soft. She exhibits no distension. There is no tenderness.  Mild epigastric tenderness. No pain in the RUQ. New belly-button piercing with some mild surrounding erythema, no drainage.   Musculoskeletal: She exhibits no edema or tenderness.  Neurological: She is alert and oriented to person, place, and time.  Skin: Skin is warm and dry. She is not diaphoretic.  Psychiatric: She has a normal mood and affect. Her behavior is normal.  Nursing note and vitals reviewed.  ED Treatments / Results  DIAGNOSTIC STUDIES: Oxygen Saturation is 100% on RA, nl by my interpretation.    COORDINATION OF CARE: 1:16 PM Discussed treatment plan with pt at bedside and pt agreed to plan.  Labs (all labs ordered are listed, but only abnormal results are displayed) Labs Reviewed  BASIC METABOLIC PANEL - Abnormal; Notable for the following:       Result Value   BUN  <5 (*)    Calcium 8.8 (*)    All other components within normal limits  HEPATIC FUNCTION PANEL - Abnormal; Notable for the following:    ALT 13 (*)    Bilirubin, Direct <0.1 (*)    All other components within normal limits  CBC  LIPASE, BLOOD  I-STAT TROPOININ, ED    EKG  EKG Interpretation  Date/Time:  Wednesday Apr 15 2017 12:31:11 EDT Ventricular Rate:  90 PR Interval:  158 QRS Duration: 78 QT Interval:  328 QTC Calculation: 401 R Axis:   77 Text Interpretation:  Normal sinus rhythm with sinus arrhythmia Normal ECG T wave amplitude has decreased in all leads Otherwise no significant change Confirmed by Lema Heinkel MD, DANIEL [phone]([id number]) on 04/15/2017 1:09:30 PM       Radiology Dg Chest 2 View  Result Date: 04/15/2017 CLINICAL DATA:  Intermittent chest pain for 1 week. EXAM: CHEST  2 VIEW COMPARISON:  PA and lateral chest 03/26/2016. FINDINGS: The lungs are clear. Heart size is normal. No pneumothorax or pleural effusion. No bony abnormality. IMPRESSION: Normal chest. Electronically Signed   By: Drusilla Kanner M.D.   On: 04/15/2017 13:05    Procedures Procedures (including critical care time)  Medications Ordered in ED Medications  gi cocktail (Maalox,Lidocaine,Donnatal) (30 mLs Oral Given 04/15/17 1420)     Initial Impression / Assessment and Plan / ED Course  I have reviewed the triage vital signs and the nursing notes.  Pertinent labs & imaging results that were available during my care of the patient were reviewed by me and considered in my medical decision making (see chart for details).     21 yo F With a chief complaint of chest pain. Going on for the past few days. Atypical in nature. Coming and going. Nonexertional. Actually radiates from her epigastrium up into her neck. Patient had labs done in triage negative troponin and EKG unremarkable chest x-ray unremarkable. Patient does have some mild epigastric tenderness on exam. Lipase and LFTs were performed that were  reassuring. Discharge home. Trial of Zantac.  3:36 PM:  I have discussed the diagnosis/risks/treatment options with the patient and believe the pt to be eligible for discharge home to follow-up with PCP. We also discussed returning to the ED immediately if new or worsening sx occur. We discussed the sx which are most concerning (e.g., sudden worsening pain, fever, inability to tolerate by mouth) that necessitate immediate return. Medications administered to the patient during their visit and any new prescriptions provided to the patient are listed below.  Medications given during this visit Medications  gi cocktail (Maalox,Lidocaine,Donnatal) (30 mLs Oral Given 04/15/17 1420)     The patient appears reasonably screen and/or stabilized for discharge and I doubt any other medical condition or other [facility] requiring further screening, evaluation, or treatment in the ED at this time prior to discharge.    Final Clinical Impressions(s) / ED Diagnoses   Final diagnoses:  Nonspecific chest pain    New Prescriptions New Prescriptions   No medications on file   I personally performed the services described in this documentation, which was scribed in my presence. The recorded information has been reviewed and is accurate.    Melene Plan, DO 04/15/17 1536

## 2017-04-15 NOTE — ED Triage Notes (Signed)
Per Pt, Pt is coming from home with complaints of intermittent central chest pain for one week. Reports SOB and dizziness with chest pain. Cough noted with brown sputum.

## 2017-04-21 ENCOUNTER — Encounter (HOSPITAL_COMMUNITY): Payer: Self-pay

## 2017-04-21 ENCOUNTER — Emergency Department (HOSPITAL_COMMUNITY)
Admission: EM | Admit: 2017-04-21 | Discharge: 2017-04-22 | Disposition: A | Discharge status: Home / Self Care | Payer: Medicaid Other | Attending: Emergency Medicine | Admitting: Emergency Medicine

## 2017-04-21 DIAGNOSIS — J302 Other seasonal allergic rhinitis: Secondary | ICD-10-CM | POA: Insufficient documentation

## 2017-04-21 DIAGNOSIS — Z711 Person with feared health complaint in whom no diagnosis is made: Secondary | ICD-10-CM

## 2017-04-21 DIAGNOSIS — N760 Acute vaginitis: Secondary | ICD-10-CM

## 2017-04-21 DIAGNOSIS — B9689 Other specified bacterial agents as the cause of diseases classified elsewhere: Secondary | ICD-10-CM | POA: Insufficient documentation

## 2017-04-21 DIAGNOSIS — J069 Acute upper respiratory infection, unspecified: Secondary | ICD-10-CM

## 2017-04-21 DIAGNOSIS — Z79899 Other long term (current) drug therapy: Secondary | ICD-10-CM | POA: Insufficient documentation

## 2017-04-21 DIAGNOSIS — J3089 Other allergic rhinitis: Secondary | ICD-10-CM

## 2017-04-21 DIAGNOSIS — F909 Attention-deficit hyperactivity disorder, unspecified type: Secondary | ICD-10-CM | POA: Insufficient documentation

## 2017-04-21 LAB — URINALYSIS, ROUTINE W REFLEX MICROSCOPIC
Bilirubin Urine: NEGATIVE
GLUCOSE, UA: NEGATIVE mg/dL
HGB URINE DIPSTICK: NEGATIVE
Ketones, ur: NEGATIVE mg/dL
NITRITE: NEGATIVE
Protein, ur: NEGATIVE mg/dL
Specific Gravity, Urine: 1.026 (ref 1.005–1.030)
pH: 7 (ref 5.0–8.0)

## 2017-04-21 LAB — WET PREP, GENITAL
Sperm: NONE SEEN
TRICH WET PREP: NONE SEEN
Yeast Wet Prep HPF POC: NONE SEEN

## 2017-04-21 LAB — PREGNANCY, URINE: Preg Test, Ur: NEGATIVE

## 2017-04-21 MED ORDER — ALBUTEROL SULFATE (2.5 MG/3ML) 0.083% IN NEBU
5.0000 mg | INHALATION_SOLUTION | Freq: Once | RESPIRATORY_TRACT | Status: AC
  Administered 2017-04-21: 5 mg via RESPIRATORY_TRACT
  Filled 2017-04-21: qty 6

## 2017-04-21 MED ORDER — IPRATROPIUM BROMIDE 0.02 % IN SOLN
0.5000 mg | Freq: Once | RESPIRATORY_TRACT | Status: AC
  Administered 2017-04-21: 0.5 mg via RESPIRATORY_TRACT
  Filled 2017-04-21: qty 2.5

## 2017-04-21 NOTE — ED Provider Notes (Signed)
[facility] Provider Note   CSN: [id number] Arrival date & time: 04/21/17  1830     History   Chief Complaint Chief Complaint  Patient presents with  . URI  . Vaginal Itching    HPI Alyssa [name] is a 21 y.o. female.  Alyssa [name] is a 21 y.o. Female with history of asthma who presents the emergency department complaining of cough, wheezing and intermittent chest tightness for the past 2 weeks. Patient reports she was seen in the emergency department about 6 days ago for chest pain with an unremarkable workup. She is back today because she still is having some wheezing and coughing. No fevers. She also reports she's been having vaginal discharge for several weeks. She is sexually active and does not always use protection. She like to be at check for STDs and bacterial vaginosis. Last menstrual cycle was 04/03/2017. She denies vaginal bleeding. She has a prescription for an inhaler but does not have one currently. She denies fevers, shortness of breath, chest pain, abdominal pain, nausea, vomiting, rashes, vaginal bleeding, lightheadedness.    The history is provided by the patient and medical records. No language interpreter was used.  URI   Associated symptoms include cough and wheezing. Pertinent negatives include no chest pain, no abdominal pain, no nausea, no vomiting, no dysuria, no congestion, no headaches, no rhinorrhea, no sore throat, no neck pain and no rash.  Vaginal Itching  Pertinent negatives include no chest pain, no abdominal pain, no headaches and no shortness of breath.    Past Medical History:  Diagnosis Date  . ADHD (attention deficit hyperactivity disorder)   . Angio-edema   . Asthma   . Eczema   . Recurrent upper respiratory infection (URI)   . Urticaria     Patient Active Problem List   Diagnosis Date Noted  . Food allergy 02/11/2016  . Seasonal and perennial allergic rhinoconjunctivitis 02/11/2016  . Atopic dermatitis  02/11/2016  . Intermittent coughing/wheezing 02/11/2016    Past Surgical History:  Procedure Laterality Date  . WISDOM TOOTH EXTRACTION      OB History    No data available       Home Medications    Prior to Admission medications   Medication Sig Start Date End Date Taking? Authorizing Provider  albuterol (PROAIR HFA) 108 (90 Base) MCG/ACT inhaler TWO PUFFS EVERY 4-6 HOURS AS NEEDED FOR COUGH OR WHEEZE. 02/11/16   Bobbitt, Heywood Iles, MD  benzonatate (TESSALON) 100 MG capsule Take 1 capsule (100 mg total) by mouth 2 (two) times daily as needed for cough. 04/22/17   Everlene Farrier, PA-C  cetirizine (ZYRTEC ALLERGY) 10 MG tablet Take 1 tablet (10 mg total) by mouth daily. 04/22/17   Everlene Farrier, PA-C  desonide (DESOWEN) 0.05 % ointment Apply 1 application topically 2 (two) times daily. Apply to face/neck once daily avoiding eyes as directed 02/12/16   Bobbitt, Heywood Iles, MD  Efinaconazole 10 % SOLN Apply 1 application topically daily. 05/22/16   Everlene Farrier, PA-C  EPINEPHrine 0.3 mg/0.3 mL IJ SOAJ injection Inject 0.3 mLs (0.3 mg total) into the muscle once. 02/11/16   Bobbitt, Heywood Iles, MD  fluticasone (FLONASE) 50 MCG/ACT nasal spray Place 2 sprays into both nostrils daily. 04/22/17   Everlene Farrier, PA-C  HYDROcodone-acetaminophen (NORCO/VICODIN) 5-325 MG tablet Take 1 tablet by mouth every 6 (six) hours as needed for moderate pain. 03/26/16   Lawyer, Cristal Deer, PA-C  metroNIDAZOLE (FLAGYL) 500 MG tablet Take 1 tablet (500 mg total) by mouth  2 (two) times daily. 04/22/17   Everlene Farrieransie, Zuhayr Deeney, PA-C  mometasone (ELOCON) 0.1 % ointment Apply to areas below face/neck once daily as directed 02/12/16   Bobbitt, Heywood Ilesalph Carter, MD    Family History Family History  Problem Relation Age of Onset  . Asthma Mother   . Eczema Mother   . Allergic rhinitis Mother   . Angioedema Mother   . Asthma Brother   . Asthma Brother   . Allergic rhinitis Brother   . Urticaria Neg Hx   .  Immunodeficiency Neg Hx     Social History Social History  Substance Use Topics  . Smoking status: Never Smoker  . Smokeless tobacco: Never Used  . Alcohol use No     Allergies   Banana; Peach flavor; and Shellfish allergy   Review of Systems Review of Systems  Constitutional: Negative for chills and fever.  HENT: Negative for congestion, postnasal drip, rhinorrhea and sore throat.   Eyes: Negative for visual disturbance.  Respiratory: Positive for cough, chest tightness and wheezing. Negative for shortness of breath.   Cardiovascular: Negative for chest pain and palpitations.  Gastrointestinal: Negative for abdominal pain, nausea and vomiting.  Genitourinary: Positive for vaginal discharge. Negative for decreased urine volume, difficulty urinating, dysuria, flank pain, frequency, hematuria, menstrual problem, pelvic pain, urgency and vaginal bleeding.  Musculoskeletal: Negative for back pain and neck pain.  Skin: Negative for rash.  Neurological: Negative for headaches.     Physical Exam Updated Vital Signs BP 115/71 (BP Location: Left Arm)   Pulse 72   Temp 98.7 F (37.1 C) (Oral)   Resp 16   LMP 04/03/2017   SpO2 99%   Physical Exam  Constitutional: She appears well-developed and well-nourished. No distress.  Nontoxic-appearing.  HENT:  Head: Normocephalic and atraumatic.  Right Ear: External ear normal.  Left Ear: External ear normal.  Mouth/Throat: Oropharynx is clear and moist.  Eyes: Conjunctivae are normal. Pupils are equal, round, and reactive to light. Right eye exhibits no discharge. Left eye exhibits no discharge.  Neck: Neck supple. No JVD present. No tracheal deviation present.  Cardiovascular: Normal rate, regular rhythm, normal heart sounds and intact distal pulses.  Exam reveals no gallop and no friction rub.   No murmur heard. Pulmonary/Chest: Effort normal. No stridor. No respiratory distress. She has wheezes. She has no rales.  Slight  scattered wheezes noted bilaterally. No increased work of breathing. No rales or rhonchi.  Abdominal: Soft. There is no tenderness. There is no guarding.  Abdomen soft and nontender to palpation. No CVA or flank tenderness.  Genitourinary:  Genitourinary Comments: Pelvic exam performed by me with female RN chaperone. No external lesions or rashes noted. Patient has a mild amount of white vaginal discharge. Cervix is closed. No cervical motion tenderness. No adnexal tenderness or fullness. No vaginal bleeding.  Musculoskeletal: She exhibits no edema.  Lymphadenopathy:    She has no cervical adenopathy.  Neurological: She is alert. Coordination normal.  Skin: Skin is warm and dry. Capillary refill takes less than 2 seconds. No rash noted. She is not diaphoretic. No erythema. No pallor.  Psychiatric: She has a normal mood and affect. Her behavior is normal.  Nursing note and vitals reviewed.    ED Treatments / Results  Labs (all labs ordered are listed, but only abnormal results are displayed) Labs Reviewed  WET PREP, GENITAL - Abnormal; Notable for the following:       Result Value   Clue Cells Wet Prep HPF POC  PRESENT (*)    WBC, Wet Prep HPF POC MANY (*)    All other components within normal limits  URINALYSIS, ROUTINE W REFLEX MICROSCOPIC - Abnormal; Notable for the following:    APPearance HAZY (*)    Leukocytes, UA TRACE (*)    Bacteria, UA RARE (*)    Squamous Epithelial / LPF 6-30 (*)    All other components within normal limits  PREGNANCY, URINE  RPR  HIV ANTIBODY (ROUTINE TESTING)  POC URINE PREG, ED  GC/CHLAMYDIA PROBE AMP ([place]) NOT AT [facility]    EKG  EKG Interpretation None       Radiology No results found.  Procedures Procedures (including critical care time)  Medications Ordered in ED Medications  azithromycin (ZITHROMAX) tablet 1,000 mg (not administered)  cefTRIAXone (ROCEPHIN) injection 250 mg (not administered)  albuterol (PROVENTIL  HFA;VENTOLIN HFA) 108 (90 Base) MCG/ACT inhaler 2 puff (not administered)  AEROCHAMBER PLUS FLO-VU LARGE MISC 1 each (not administered)  albuterol (PROVENTIL) (2.5 MG/3ML) 0.083% nebulizer solution 5 mg (5 mg Nebulization Given 04/21/17 2313)  ipratropium (ATROVENT) nebulizer solution 0.5 mg (0.5 mg Nebulization Given 04/21/17 2313)     Initial Impression / Assessment and Plan / ED Course  I have reviewed the triage vital signs and the nursing notes.  Pertinent labs & imaging results that were available during my care of the patient were reviewed by me and considered in my medical decision making (see chart for details).    This is a 21 y.o. Female with history of asthma who presents the emergency department complaining of cough, wheezing and intermittent chest tightness for the past 2 weeks. Patient reports she was seen in the emergency department about 6 days ago for chest pain with an unremarkable workup. She is back today because she still is having some wheezing and coughing. No fevers. She also reports she's been having vaginal discharge for several weeks. She is sexually active and does not always use protection. She like to be at check for STDs and bacterial vaginosis. Last menstrual cycle was 04/03/2017. She denies vaginal bleeding. On exam patient is afebrile nontoxic-appearing. She has slight scattered wheezes noted bilaterally. No increased work of breathing. No rales or rhonchi. She is afebrile. Her abdomen is soft and nontender to palpation. She has mild white vaginal discharge on pelvic exam. Pregnancy test is negative. Urinalysis is nitrite negative with trace leukocytes. Doubt UTI she has no urinary symptoms. Wet prep shows clue cells and many white blood cells. Suspect STD. We'll go ahead and treat her with Rocephin and azithromycin for gonorrhea and chlamydia. I advised that these tests are pending. Also advised that HIV and syphilis testing are pending. I encouraged her to follow-up  in 3 days with these test results. I discussed safe sex practices. At reevaluation following breathing treatment patient reports feeling much better. Will provide her with an albuterol inhaler today. Patient with URI. I see no need for chest x-ray she is afebrile and had a chest x-ray 6 days ago that was normal. We'll discharge at this time with prescriptions for Flonase, Zyrtec and Flagyl for BV. I again discussed safe sex practices. Return precautions discussed. I encouraged her to follow-up with the health department. I advised the patient to follow-up with their primary care provider this week. I advised the patient to return to the emergency department with new or worsening symptoms or new concerns. The patient verbalized understanding and agreement with plan.      Final Clinical Impressions(s) /  ED Diagnoses   Final diagnoses:  Viral upper respiratory tract infection  Seasonal allergic rhinitis due to other allergic trigger  Concern about STD in female without diagnosis  BV (bacterial vaginosis)    New Prescriptions New Prescriptions   BENZONATATE (TESSALON) 100 MG CAPSULE    Take 1 capsule (100 mg total) by mouth 2 (two) times daily as needed for cough.   CETIRIZINE (ZYRTEC ALLERGY) 10 MG TABLET    Take 1 tablet (10 mg total) by mouth daily.   FLUTICASONE (FLONASE) 50 MCG/ACT NASAL SPRAY    Place 2 sprays into both nostrils daily.   METRONIDAZOLE (FLAGYL) 500 MG TABLET    Take 1 tablet (500 mg total) by mouth 2 (two) times daily.     Everlene Farrier, PA-C 04/22/17 Lorenda Cahill    Jacalyn Lefevre, MD 04/24/17 [phone]

## 2017-04-21 NOTE — ED Triage Notes (Signed)
Pt reports URI symptoms X2 weeks. Pt states she has woken up out of sleep and had to use her inhaler. Cough present at triage. Resp e/u.  Pt also states she wants to be checked for bacterial vaginosis.

## 2017-04-22 LAB — RPR: RPR Ser Ql: NONREACTIVE

## 2017-04-22 LAB — GC/CHLAMYDIA PROBE AMP (~~LOC~~) NOT AT ARMC
CHLAMYDIA, DNA PROBE: NEGATIVE
NEISSERIA GONORRHEA: NEGATIVE

## 2017-04-22 LAB — HIV ANTIBODY (ROUTINE TESTING W REFLEX): HIV Screen 4th Generation wRfx: NONREACTIVE

## 2017-04-22 MED ORDER — METRONIDAZOLE 500 MG PO TABS: 500.0000 mg | ORAL_TABLET | Freq: Two times a day (BID) | ORAL | 0 refills | Status: DC

## 2017-04-22 MED ORDER — AEROCHAMBER PLUS FLO-VU LARGE MISC
Status: AC
  Filled 2017-04-22: qty 1

## 2017-04-22 MED ORDER — ALBUTEROL SULFATE HFA 108 (90 BASE) MCG/ACT IN AERS
2.0000 | INHALATION_SPRAY | Freq: Once | RESPIRATORY_TRACT | Status: AC
  Administered 2017-04-22: 2 via RESPIRATORY_TRACT
  Filled 2017-04-22: qty 6.7

## 2017-04-22 MED ORDER — STERILE WATER FOR INJECTION IJ SOLN
INTRAMUSCULAR | Status: AC
  Administered 2017-04-22: 0.9 mL
  Filled 2017-04-22: qty 10

## 2017-04-22 MED ORDER — BENZONATATE 100 MG PO CAPS: 100.0000 mg | ORAL_CAPSULE | Freq: Two times a day (BID) | ORAL | 0 refills | Status: DC | PRN

## 2017-04-22 MED ORDER — CEFTRIAXONE SODIUM 250 MG IJ SOLR
250.0000 mg | Freq: Once | INTRAMUSCULAR | Status: AC
  Administered 2017-04-22: 250 mg via INTRAMUSCULAR
  Filled 2017-04-22: qty 250

## 2017-04-22 MED ORDER — CETIRIZINE HCL 10 MG PO TABS: 10.0000 mg | ORAL_TABLET | Freq: Every day | ORAL | 1 refills | Status: DC

## 2017-04-22 MED ORDER — AZITHROMYCIN 250 MG PO TABS
1000.0000 mg | ORAL_TABLET | Freq: Once | ORAL | Status: AC
  Administered 2017-04-22: 1000 mg via ORAL
  Filled 2017-04-22: qty 4

## 2017-04-22 MED ORDER — FLUTICASONE PROPIONATE 50 MCG/ACT NA SUSP: 2.0000 | Freq: Every day | NASAL | 0 refills | Status: DC

## 2017-04-22 MED ORDER — AEROCHAMBER PLUS FLO-VU LARGE MISC
1.0000 | Freq: Once | Status: AC
  Administered 2017-04-22: 1

## 2017-05-05 ENCOUNTER — Encounter (HOSPITAL_COMMUNITY): Payer: Self-pay | Admitting: *Deleted

## 2017-05-05 ENCOUNTER — Emergency Department (HOSPITAL_COMMUNITY)
Admission: EM | Admit: 2017-05-05 | Discharge: 2017-05-06 | Disposition: A | Discharge status: Home / Self Care | Payer: Medicaid Other | Attending: Emergency Medicine | Admitting: Emergency Medicine

## 2017-05-05 DIAGNOSIS — J039 Acute tonsillitis, unspecified: Secondary | ICD-10-CM | POA: Diagnosis not present

## 2017-05-05 DIAGNOSIS — Z79899 Other long term (current) drug therapy: Secondary | ICD-10-CM | POA: Insufficient documentation

## 2017-05-05 DIAGNOSIS — J45909 Unspecified asthma, uncomplicated: Secondary | ICD-10-CM | POA: Insufficient documentation

## 2017-05-05 DIAGNOSIS — J029 Acute pharyngitis, unspecified: Secondary | ICD-10-CM | POA: Diagnosis present

## 2017-05-05 DIAGNOSIS — F909 Attention-deficit hyperactivity disorder, unspecified type: Secondary | ICD-10-CM | POA: Diagnosis not present

## 2017-05-05 LAB — CBC WITH DIFFERENTIAL/PLATELET
BASOS PCT: 0 %
Basophils Absolute: 0 10*3/uL (ref 0.0–0.1)
Eosinophils Absolute: 0 10*3/uL (ref 0.0–0.7)
Eosinophils Relative: 0 %
HCT: 38.7 % (ref 36.0–46.0)
HEMOGLOBIN: 13.6 g/dL (ref 12.0–15.0)
LYMPHS PCT: 25 %
Lymphs Abs: 3.7 10*3/uL (ref 0.7–4.0)
MCH: 29.9 pg (ref 26.0–34.0)
MCHC: 35.1 g/dL (ref 30.0–36.0)
MCV: 85.1 fL (ref 78.0–100.0)
MONOS PCT: 13 %
Monocytes Absolute: 1.9 10*3/uL — ABNORMAL HIGH (ref 0.1–1.0)
NEUTROS PCT: 62 %
Neutro Abs: 9.2 10*3/uL — ABNORMAL HIGH (ref 1.7–7.7)
Platelets: 149 10*3/uL — ABNORMAL LOW (ref 150–400)
RBC: 4.55 MIL/uL (ref 3.87–5.11)
RDW: 12.6 % (ref 11.5–15.5)
WBC: 14.8 10*3/uL — ABNORMAL HIGH (ref 4.0–10.5)

## 2017-05-05 LAB — RAPID STREP SCREEN (MED CTR MEBANE ONLY): Streptococcus, Group A Screen (Direct): NEGATIVE

## 2017-05-05 LAB — MONONUCLEOSIS SCREEN: Mono Screen: NEGATIVE

## 2017-05-05 MED ORDER — AMOXICILLIN 500 MG PO CAPS
500.0000 mg | ORAL_CAPSULE | Freq: Once | ORAL | Status: AC
  Administered 2017-05-05: 500 mg via ORAL
  Filled 2017-05-05: qty 1

## 2017-05-05 MED ORDER — ACETAMINOPHEN 325 MG PO TABS
650.0000 mg | ORAL_TABLET | Freq: Once | ORAL | Status: DC | PRN
  Filled 2017-05-05: qty 2

## 2017-05-05 MED ORDER — ACETAMINOPHEN 325 MG PO TABS
ORAL_TABLET | ORAL | Status: AC
  Filled 2017-05-05: qty 2

## 2017-05-05 MED ORDER — DEXAMETHASONE 4 MG PO TABS
10.0000 mg | ORAL_TABLET | Freq: Once | ORAL | Status: AC
  Administered 2017-05-05: 10 mg via ORAL
  Filled 2017-05-05: qty 3

## 2017-05-05 MED ORDER — HYDROCODONE-ACETAMINOPHEN 5-325 MG PO TABS
1.0000 | ORAL_TABLET | Freq: Once | ORAL | Status: AC
  Administered 2017-05-05: 1 via ORAL
  Filled 2017-05-05: qty 1

## 2017-05-05 MED ORDER — AMOXICILLIN 500 MG PO CAPS: 500.0000 mg | ORAL_CAPSULE | Freq: Three times a day (TID) | ORAL | 0 refills | Status: DC

## 2017-05-05 NOTE — Discharge Instructions (Signed)
Use salt water gargles, chloraseptic spray and take tylenol and ibuprofen as needed for pain and fever. Take the antibiotics and schedule follow up with your doctor. Return here for worsening symptoms such as difficulty swallowing, fever that does not come down with tylenol and ibuprofen, stiff neck or severe headache.

## 2017-05-05 NOTE — ED Triage Notes (Signed)
Pt c/o sore throat x2 days. Pain increases when swallowing

## 2017-05-05 NOTE — ED Provider Notes (Signed)
[facility]-EMERGENCY DEPT Provider Note    By signing my name below, I, Earmon Phoenix, attest that this documentation has been prepared under the direction and in the presence of [facility], [place]. Electronically Signed: Earmon Phoenix, ED Scribe. 05/05/17. 11:36 PM.    History   Chief Complaint Chief Complaint  Patient presents with  . Sore Throat   The history is provided by the patient and medical records. No language interpreter was used.    Alyssa [name] is a 21 y.o. female who presents to the Emergency Department complaining of a sore throat that began two days ago. She reports associated fever, chills, cough and congestion. She has not taken anything for pain. 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 Swallowing increases the pain. She denies alleviating factors. She denies difficulty breathing or swallowing, nausea, vomiting, dysuria.   Past Medical History:  Diagnosis Date  . ADHD (attention deficit hyperactivity disorder)   . Angio-edema   . Asthma   . Eczema   . Recurrent upper respiratory infection (URI)   . Urticaria     Patient Active Problem List   Diagnosis Date Noted  . Food allergy 02/11/2016  . Seasonal and perennial allergic rhinoconjunctivitis 02/11/2016  . Atopic dermatitis 02/11/2016  . Intermittent coughing/wheezing 02/11/2016    Past Surgical History:  Procedure Laterality Date  . WISDOM TOOTH EXTRACTION      OB History    No data available       Home Medications    Prior to Admission medications   Medication Sig Start Date End Date Taking? Authorizing Provider  albuterol (PROAIR HFA) 108 (90 Base) MCG/ACT inhaler TWO PUFFS EVERY 4-6 HOURS AS NEEDED FOR COUGH OR WHEEZE. 02/11/16   Bobbitt, Heywood Iles, MD  amoxicillin (AMOXIL) 500 MG capsule Take 1 capsule (500 mg total) by mouth 3 (three) times daily. 05/05/17   Janne Napoleon, NP  benzonatate (TESSALON) 100 MG capsule Take 1  capsule (100 mg total) by mouth 2 (two) times daily as needed for cough. 04/22/17   Everlene Farrier, PA-C  cetirizine (ZYRTEC ALLERGY) 10 MG tablet Take 1 tablet (10 mg total) by mouth daily. 04/22/17   Everlene Farrier, PA-C  desonide (DESOWEN) 0.05 % ointment Apply 1 application topically 2 (two) times daily. Apply to face/neck once daily avoiding eyes as directed 02/12/16   Bobbitt, Heywood Iles, MD  Efinaconazole 10 % SOLN Apply 1 application topically daily. 05/22/16   Everlene Farrier, PA-C  EPINEPHrine 0.3 mg/0.3 mL IJ SOAJ injection Inject 0.3 mLs (0.3 mg total) into the muscle once. 02/11/16   Bobbitt, Heywood Iles, MD  fluticasone (FLONASE) 50 MCG/ACT nasal spray Place 2 sprays into both nostrils daily. 04/22/17   Everlene Farrier, PA-C  HYDROcodone-acetaminophen (NORCO/VICODIN) 5-325 MG tablet Take 1 tablet by mouth every 6 (six) hours as needed for moderate pain. 03/26/16   Lawyer, Cristal Deer, PA-C  metroNIDAZOLE (FLAGYL) 500 MG tablet Take 1 tablet (500 mg total) by mouth 2 (two) times daily. 04/22/17   Everlene Farrier, PA-C  mometasone (ELOCON) 0.1 % ointment Apply to areas below face/neck once daily as directed 02/12/16   Bobbitt, Heywood Iles, MD    Family History Family History  Problem Relation Age of Onset  . Asthma Mother   . Eczema Mother   . Allergic rhinitis Mother   . Angioedema Mother   . Asthma Brother   . Asthma Brother   . Allergic rhinitis Brother   .  Urticaria Neg Hx   . Immunodeficiency Neg Hx     Social History Social History  Substance Use Topics  . Smoking status: Never Smoker  . Smokeless tobacco: Never Used  . Alcohol use No     Allergies   Banana; Peach flavor; and Shellfish allergy   Review of Systems Review of Systems  Constitutional: Positive for chills and fever.  HENT: Positive for congestion and sore throat. Negative for trouble swallowing.   Eyes: Negative for redness and itching.  Respiratory: Positive for cough. Negative for shortness of  breath.   Gastrointestinal: Negative for nausea and vomiting.  Genitourinary: Negative for dysuria.  Musculoskeletal: Negative for back pain.  Skin: Negative for rash.  Neurological: Negative for headaches.  Hematological: Positive for adenopathy.     Physical Exam Updated Vital Signs BP 113/71 (BP Location: Right Arm)   Pulse 95   Temp (!) 101.7 F (38.7 C) (Oral)   Resp 18   Ht 5' 2.75" (1.594 m)   Wt 172 lb (78 kg)   SpO2 100%   BMI 30.71 kg/m   Physical Exam  Constitutional: She is oriented to person, place, and time. She appears well-developed and well-nourished. No distress.  HENT:  Head: Normocephalic.  Right Ear: Tympanic membrane and ear canal normal.  Left Ear: Tympanic membrane and ear canal normal.  Mouth/Throat: Uvula is midline and mucous membranes are normal. Oropharyngeal exudate and posterior oropharyngeal erythema present. No tonsillar abscesses. Tonsillar exudate.  Eyes: Conjunctivae and EOM are normal.  Neck: Trachea normal and normal range of motion. Neck supple.  Cardiovascular: Normal rate and regular rhythm.   Pulmonary/Chest: Effort normal. She has no wheezes. She has no rales.  Abdominal: Soft. There is no tenderness.  Musculoskeletal: Normal range of motion.  Lymphadenopathy:    She has cervical adenopathy.  Neurological: She is alert and oriented to person, place, and time. No cranial nerve deficit.  Skin: Skin is warm and dry.  Psychiatric: She has a normal mood and affect. Her behavior is normal.  Nursing note and vitals reviewed.    ED Treatments / Results   DIAGNOSTIC STUDIES: Oxygen Saturation is 100% on RA, normal by my interpretation.   COORDINATION OF CARE: 9:37 PM- Will test for mono and order labs. Pt verbalizes understanding and agrees to plan.  Medications  acetaminophen (TYLENOL) tablet 650 mg (650 mg Oral Refused 05/05/17 2041)  amoxicillin (AMOXIL) capsule 500 mg (500 mg Oral Given 05/05/17 2342)  dexamethasone  (DECADRON) tablet 10 mg (10 mg Oral Given 05/05/17 2342)  HYDROcodone-acetaminophen (NORCO/VICODIN) 5-325 MG per tablet 1 tablet (1 tablet Oral Given 05/05/17 2342)   Labs (all labs ordered are listed, but only abnormal results are displayed) Labs Reviewed  CBC WITH DIFFERENTIAL/PLATELET - Abnormal; Notable for the following:       Result Value   WBC 14.8 (*)    Platelets 149 (*)    Neutro Abs 9.2 (*)    Monocytes Absolute 1.9 (*)    All other components within normal limits  RAPID STREP SCREEN (NOT AT [facility])  CULTURE, GROUP A STREP [facility])  MONONUCLEOSIS SCREEN   Radiology No results found.  Procedures Procedures (including critical care time)  Medications Ordered in ED Medications  acetaminophen (TYLENOL) tablet 650 mg (650 mg Oral Refused 05/05/17 2041)  amoxicillin (AMOXIL) capsule 500 mg (500 mg Oral Given 05/05/17 2342)  dexamethasone (DECADRON) tablet 10 mg (10 mg Oral Given 05/05/17 2342)  HYDROcodone-acetaminophen (NORCO/VICODIN) 5-325 MG per tablet 1 tablet (1 tablet  Oral Given 05/05/17 2342)     Initial Impression / Assessment and Plan / ED Course  I have reviewed the triage vital signs and the nursing notes. Pt with negative strep and mono. Diagnosis of tonsillitis. Will prescribe Amoxicillin. First dose of antibiotic, Vicodin and Decadron given prior to discharge. Recommended pt to take OTC Ibuprofen for pain and swelling at home. No evidence of dehydration. Pt is tolerating secretions. Presentation not concerning for peritonsillar abscess or spread of infection to deep spaces of the throat; patent airway. Specific return precautions discussed. Recommended PCP follow up. Pt appears safe for discharge.   Final Clinical Impressions(s) / ED Diagnoses   Final diagnoses:  Tonsillitis    New Prescriptions New Prescriptions   AMOXICILLIN (AMOXIL) 500 MG CAPSULE    Take 1 capsule (500 mg total) by mouth 3 (three) times daily.   I personally performed the services described  in this documentation, which was scribed in my presence. The recorded information has been reviewed and is accurate.    Kerrie Buffalo Donal[place], [place] 05/06/17 0002    Tegeler, Canary Brim, MD 05/06/17 [phone]

## 2017-05-06 MED ORDER — ACETAMINOPHEN 325 MG PO TABS
325.0000 mg | ORAL_TABLET | Freq: Once | ORAL | Status: AC | PRN
  Administered 2017-05-06: 325 mg via ORAL

## 2017-05-08 LAB — CULTURE, GROUP A STREP (THRC)

## 2017-07-23 ENCOUNTER — Emergency Department (HOSPITAL_COMMUNITY)
Admission: EM | Admit: 2017-07-23 | Discharge: 2017-07-24 | Discharge status: Against Medical Advice | Payer: Medicaid Other

## 2017-07-23 NOTE — ED Notes (Signed)
This RN called pt x 2 for triage, no answer.

## 2017-07-31 ENCOUNTER — Encounter (HOSPITAL_COMMUNITY): Payer: Self-pay

## 2017-07-31 ENCOUNTER — Inpatient Hospital Stay (HOSPITAL_COMMUNITY)
Admission: AD | Admit: 2017-07-31 | Discharge: 2017-07-31 | Disposition: A | Discharge status: Home / Self Care | Payer: Self-pay | Source: Ambulatory Visit | Attending: Obstetrics and Gynecology | Admitting: Obstetrics and Gynecology

## 2017-07-31 DIAGNOSIS — B373 Candidiasis of vulva and vagina: Secondary | ICD-10-CM | POA: Insufficient documentation

## 2017-07-31 DIAGNOSIS — Z3202 Encounter for pregnancy test, result negative: Secondary | ICD-10-CM

## 2017-07-31 DIAGNOSIS — B3731 Acute candidiasis of vulva and vagina: Secondary | ICD-10-CM

## 2017-07-31 DIAGNOSIS — B9689 Other specified bacterial agents as the cause of diseases classified elsewhere: Secondary | ICD-10-CM | POA: Insufficient documentation

## 2017-07-31 DIAGNOSIS — N760 Acute vaginitis: Secondary | ICD-10-CM

## 2017-07-31 DIAGNOSIS — Z113 Encounter for screening for infections with a predominantly sexual mode of transmission: Secondary | ICD-10-CM

## 2017-07-31 LAB — URINALYSIS, ROUTINE W REFLEX MICROSCOPIC
BILIRUBIN URINE: NEGATIVE
GLUCOSE, UA: NEGATIVE mg/dL
HGB URINE DIPSTICK: NEGATIVE
Ketones, ur: NEGATIVE mg/dL
NITRITE: NEGATIVE
PH: 7 (ref 5.0–8.0)
Protein, ur: NEGATIVE mg/dL
SPECIFIC GRAVITY, URINE: 1.016 (ref 1.005–1.030)

## 2017-07-31 LAB — POCT PREGNANCY, URINE: PREG TEST UR: NEGATIVE

## 2017-07-31 LAB — WET PREP, GENITAL
Sperm: NONE SEEN
Trich, Wet Prep: NONE SEEN

## 2017-07-31 MED ORDER — FLUCONAZOLE 150 MG PO TABS: 150.0000 mg | ORAL_TABLET | Freq: Every day | ORAL | 1 refills | Status: DC

## 2017-07-31 MED ORDER — METRONIDAZOLE 500 MG PO TABS: 500.0000 mg | ORAL_TABLET | Freq: Two times a day (BID) | ORAL | 0 refills | Status: DC

## 2017-07-31 NOTE — MAU Note (Signed)
somethingi s wrong with her vagina.  Has a d/c and irritation.

## 2017-07-31 NOTE — MAU Provider Note (Signed)
History     CSN: [id number]  Arrival date and time: 07/31/17 1650  First Provider Initiated Contact with Patient 07/31/17 1748      Chief Complaint  Patient presents with  . vag irritation  . Vaginal Discharge   HPI Alyssa [name] is a 21 y.o. female who presents with vaginal irritation & discharge. Symptoms began 2 days ago. Reports irritation; "not burning or itching, just irritated". Vaginal discharge since this morning that is thin & clear with "white chunks mixed in". Has not treated symptoms. Denies dysuria, vaginal bleeding, fever/chills, or abdominal pain. Patient is sexually active with 2+ partners, does not use condoms & wants STD testing.   Past Medical History:  Diagnosis Date  . ADHD (attention deficit hyperactivity disorder)   . Angio-edema   . Asthma   . Eczema   . Recurrent upper respiratory infection (URI)   . Urticaria     Past Surgical History:  Procedure Laterality Date  . WISDOM TOOTH EXTRACTION      Family History  Problem Relation Age of Onset  . Asthma Mother   . Eczema Mother   . Allergic rhinitis Mother   . Angioedema Mother   . Asthma Brother   . Asthma Brother   . Allergic rhinitis Brother   . Urticaria Neg Hx   . Immunodeficiency Neg Hx     Social History  Substance Use Topics  . Smoking status: Never Smoker  . Smokeless tobacco: Never Used  . Alcohol use No    Allergies:  Allergies  Allergen Reactions  . Banana Itching and Swelling  . Peach Flavor Itching  . Shellfish Allergy     Prescriptions Prior to Admission  Medication Sig Dispense Refill Last Dose  . albuterol (PROAIR HFA) 108 (90 Base) MCG/ACT inhaler TWO PUFFS EVERY 4-6 HOURS AS NEEDED FOR COUGH OR WHEEZE. 1 Inhaler 1 More than a month at Unknown time  . amoxicillin (AMOXIL) 500 MG capsule Take 1 capsule (500 mg total) by mouth 3 (three) times daily. 30 capsule 0   . benzonatate (TESSALON) 100 MG capsule Take 1 capsule (100 mg total) by mouth 2 (two) times  daily as needed for cough. 21 capsule 0   . cetirizine (ZYRTEC ALLERGY) 10 MG tablet Take 1 tablet (10 mg total) by mouth daily. 30 tablet 1   . desonide (DESOWEN) 0.05 % ointment Apply 1 application topically 2 (two) times daily. Apply to face/neck once daily avoiding eyes as directed 60 g 2 More than a month at Unknown time  . Efinaconazole 10 % SOLN Apply 1 application topically daily. 1 Bottle 0 More than a month at Unknown time  . EPINEPHrine 0.3 mg/0.3 mL IJ SOAJ injection Inject 0.3 mLs (0.3 mg total) into the muscle once. 2 Device 2 More than a month at Unknown time  . fluticasone (FLONASE) 50 MCG/ACT nasal spray Place 2 sprays into both nostrils daily. 16 g 0   . HYDROcodone-acetaminophen (NORCO/VICODIN) 5-325 MG tablet Take 1 tablet by mouth every 6 (six) hours as needed for moderate pain. 15 tablet 0 More than a month at Unknown time  . metroNIDAZOLE (FLAGYL) 500 MG tablet Take 1 tablet (500 mg total) by mouth 2 (two) times daily. 14 tablet 0   . mometasone (ELOCON) 0.1 % ointment Apply to areas below face/neck once daily as directed 45 g 0 More than a month at Unknown time    Review of Systems  Constitutional: Negative.   Gastrointestinal: Negative.   Genitourinary: Positive for  vaginal discharge. Negative for dyspareunia, dysuria and vaginal bleeding.       + vulvoaginal irritation   Physical Exam   Blood pressure 114/64, pulse 78, temperature 98.8 F (37.1 C), temperature source Oral, resp. rate 16, weight 159 lb 4 oz (72.2 kg), last menstrual period 07/16/2017, SpO2 99 %.  Physical Exam  Nursing note and vitals reviewed. Constitutional: She is oriented to person, place, and time. She appears well-developed and well-nourished. No distress.  HENT:  Head: Normocephalic and atraumatic.  Eyes: Conjunctivae are normal. Right eye exhibits no discharge. Left eye exhibits no discharge. No scleral icterus.  Neck: Normal range of motion.  Respiratory: Effort normal. No respiratory  distress.  GI: Soft. She exhibits no distension. There is no tenderness. There is no guarding.  Genitourinary: There is no lesion on the right labia. There is no lesion on the left labia. Cervix exhibits no motion tenderness and no friability. There is erythema in the vagina. No bleeding in the vagina. Vaginal discharge (small amount of white curd like discharge) found.  Neurological: She is alert and oriented to person, place, and time.  Skin: Skin is warm and dry. She is not diaphoretic.  Psychiatric: She has a normal mood and affect. Her behavior is normal. Judgment and thought content normal.    MAU Course  Procedures Results for orders placed or performed during the hospital encounter of 07/31/17 (from the past 24 hour(s))  Urinalysis, Routine w reflex microscopic     Status: Abnormal   Collection Time: 07/31/17  5:18 PM  Result Value Ref Range   Color, Urine YELLOW YELLOW   APPearance HAZY (A) CLEAR   Specific Gravity, Urine 1.016 1.005 - 1.030   pH 7.0 5.0 - 8.0   Glucose, UA NEGATIVE NEGATIVE mg/dL   Hgb urine dipstick NEGATIVE NEGATIVE   Bilirubin Urine NEGATIVE NEGATIVE   Ketones, ur NEGATIVE NEGATIVE mg/dL   Protein, ur NEGATIVE NEGATIVE mg/dL   Nitrite NEGATIVE NEGATIVE   Leukocytes, UA SMALL (A) NEGATIVE   RBC / HPF 0-5 0 - 5 RBC/hpf   WBC, UA 0-5 0 - 5 WBC/hpf   Bacteria, UA RARE (A) NONE SEEN   Squamous Epithelial / LPF 0-5 (A) NONE SEEN   Mucus PRESENT   Pregnancy, urine POC     Status: None   Collection Time: 07/31/17  5:30 PM  Result Value Ref Range   Preg Test, Ur NEGATIVE NEGATIVE  Wet prep, genital     Status: Abnormal   Collection Time: 07/31/17  6:09 PM  Result Value Ref Range   Yeast Wet Prep HPF POC PRESENT (A) NONE SEEN   Trich, Wet Prep NONE SEEN NONE SEEN   Clue Cells Wet Prep HPF POC PRESENT (A) NONE SEEN   WBC, Wet Prep HPF POC FEW (A) NONE SEEN   Sperm NONE SEEN     MDM UPT negative STD testing per patient request, pt declines blood  draw  Assessment and Plan  A:  1. BV (bacterial vaginosis)   2. Vaginal yeast infection   3. Screen for STD (sexually transmitted disease)   4. Pregnancy examination or test, negative result    P: Discharge home  Rx diflucan & flagyl Discussed reasons to return to MAU Abstain from intercourse until tx completed F/u with PCP prn  GC/CT pending   Judeth Horn 07/31/2017, 5:47 PM

## 2017-07-31 NOTE — Discharge Instructions (Signed)
Bacterial Vaginosis Bacterial vaginosis is a vaginal infection that occurs when the normal balance of bacteria in the vagina is disrupted. It results from an overgrowth of certain bacteria. This is the most common vaginal infection among women ages 98-44. Because bacterial vaginosis increases your risk for STIs (sexually transmitted infections), getting treated can help reduce your risk for chlamydia, gonorrhea, herpes, and HIV (human immunodeficiency virus). Treatment is also important for preventing complications in pregnant women, because this condition can cause an early (premature) delivery. What are the causes? This condition is caused by an increase in harmful bacteria that are normally present in small amounts in the vagina. However, the reason that the condition develops is not fully understood. What increases the risk? The following factors may make you more likely to develop this condition:  Having a new sexual partner or multiple sexual partners.  Having unprotected sex.  Douching.  Having an intrauterine device (IUD).  Smoking.  Drug and alcohol abuse.  Taking certain antibiotic medicines.  Being pregnant.  You cannot get bacterial vaginosis from toilet seats, bedding, swimming pools, or contact with objects around you. What are the signs or symptoms? Symptoms of this condition include:  Grey or white vaginal discharge. The discharge can also be watery or foamy.  A fish-like odor with discharge, especially after sexual intercourse or during menstruation.  Itching in and around the vagina.  Burning or pain with urination.  Some women with bacterial vaginosis have no signs or symptoms. How is this diagnosed? This condition is diagnosed based on:  Your medical history.  A physical exam of the vagina.  Testing a sample of vaginal fluid under a microscope to look for a large amount of bad bacteria or abnormal cells. Your health care provider may use a cotton swab  or a small wooden spatula to collect the sample.  How is this treated? This condition is treated with antibiotics. These may be given as a pill, a vaginal cream, or a medicine that is put into the vagina (suppository). If the condition comes back after treatment, a second round of antibiotics may be needed. Follow these instructions at home: Medicines  Take over-the-counter and prescription medicines only as told by your health care provider.  Take or use your antibiotic as told by your health care provider. Do not stop taking or using the antibiotic even if you start to feel better. General instructions  During treatment: ? Avoid sexual activity until you finish treatment. ? Do not douche. ? Avoid alcohol as directed by your health care provider. ? Avoid breastfeeding as directed by your health care provider.  Drink enough water and fluids to keep your urine clear or pale yellow.  Keep the area around your vagina and rectum clean. ? Wash the area daily with warm water. ? Wipe yourself from front to back after using the toilet.  Keep all follow-up visits as told by your health care provider. This is important. How is this prevented?  Do not douche.  Wash the outside of your vagina with warm water only.  Use protection when having sex. This includes latex condoms and dental dams.  Limit how many sexual partners you have. To help prevent bacterial vaginosis, it is best to have sex with just one partner (monogamous).  Make sure you and your sexual partner are tested for STIs.  Wear cotton or cotton-lined underwear.  Avoid wearing tight pants and pantyhose, especially during summer.  Limit the amount of alcohol that you drink.  Do  not use any products that contain nicotine or tobacco, such as cigarettes and e-cigarettes. If you need help quitting, ask your health care provider.  Do not use illegal drugs. Where to find more information:  Centers for Disease Control and  Prevention: SolutionApps.co.za  American Sexual Health Association (ASHA): www.ashastd.org  U.S. Department of Health and Health and safety inspector, Office on Women's Health: ConventionalMedicines.si or http://www.anderson-williamson.info/ Contact a health care provider if:  Your symptoms do not improve, even after treatment.  You have more discharge or pain when urinating.  You have a fever.  You have pain in your abdomen.  You have pain during sex.  You have vaginal bleeding between periods. Summary  Bacterial vaginosis is a vaginal infection that occurs when the normal balance of bacteria in the vagina is disrupted.  Because bacterial vaginosis increases your risk for STIs (sexually transmitted infections), getting treated can help reduce your risk for chlamydia, gonorrhea, herpes, and HIV (human immunodeficiency virus). Treatment is also important for preventing complications in pregnant women, because the condition can cause an early (premature) delivery.  This condition is treated with antibiotic medicines. These may be given as a pill, a vaginal cream, or a medicine that is put into the vagina (suppository). This information is not intended to replace advice given to you by your health care provider. Make sure you discuss any questions you have with your health care provider. Document Released: 11/10/2005 Document Revised: 07/26/2016 Document Reviewed: 07/26/2016 Elsevier Interactive Patient Education  2017 Elsevier Inc.    Vaginal Yeast infection, Adult Vaginal yeast infection is a condition that causes soreness, swelling, and redness (inflammation) of the vagina. It also causes vaginal discharge. This is a common condition. Some women get this infection frequently. What are the causes? This condition is caused by a change in the normal balance of the yeast (candida) and bacteria that live in the vagina. This change causes an overgrowth of yeast, which causes the  inflammation. What increases the risk? This condition is more likely to develop in:  Women who take antibiotic medicines.  Women who have diabetes.  Women who take birth control pills.  Women who are pregnant.  Women who douche often.  Women who have a weak defense (immune) system.  Women who have been taking steroid medicines for a long time.  Women who frequently wear tight clothing.  What are the signs or symptoms? Symptoms of this condition include:  White, thick vaginal discharge.  Swelling, itching, redness, and irritation of the vagina. The lips of the vagina (vulva) may be affected as well.  Pain or a burning feeling while urinating.  Pain during sex.  How is this diagnosed? This condition is diagnosed with a medical history and physical exam. This will include a pelvic exam. Your health care provider will examine a sample of your vaginal discharge under a microscope. Your health care provider may send this sample for testing to confirm the diagnosis. How is this treated? This condition is treated with medicine. Medicines may be over-the-counter or prescription. You may be told to use one or more of the following:  Medicine that is taken orally.  Medicine that is applied as a cream.  Medicine that is inserted directly into the vagina (suppository).  Follow these instructions at home:  Take or apply over-the-counter and prescription medicines only as told by your health care provider.  Do not have sex until your health care provider has approved. Tell your sex partner that you have a yeast infection. That person  should go to his or her health care provider if he or she develops symptoms.  Do not wear tight clothes, such as pantyhose or tight pants.  Avoid using tampons until your health care provider approves.  Eat more yogurt. This may help to keep your yeast infection from returning.  Try taking a sitz bath to help with discomfort. This is a warm water  bath that is taken while you are sitting down. The water should only come up to your hips and should cover your buttocks. Do this 3-4 times per day or as told by your health care provider.  Do not douche.  Wear breathable, cotton underwear.  If you have diabetes, keep your blood sugar levels under control. Contact a health care provider if:  You have a fever.  Your symptoms go away and then return.  Your symptoms do not get better with treatment.  Your symptoms get worse.  You have new symptoms.  You develop blisters in or around your vagina.  You have blood coming from your vagina and it is not your menstrual period.  You develop pain in your abdomen. This information is not intended to replace advice given to you by your health care provider. Make sure you discuss any questions you have with your health care provider. Document Released: 08/20/2005 Document Revised: 04/23/2016 Document Reviewed: 05/14/2015 Elsevier Interactive Patient Education  2018 [employer]

## 2017-08-03 LAB — GC/CHLAMYDIA PROBE AMP (~~LOC~~) NOT AT ARMC
CHLAMYDIA, DNA PROBE: NEGATIVE
Neisseria Gonorrhea: NEGATIVE

## 2017-08-30 ENCOUNTER — Emergency Department (HOSPITAL_COMMUNITY)
Admission: EM | Admit: 2017-08-30 | Discharge: 2017-08-30 | Disposition: A | Discharge status: Home / Self Care | Payer: Self-pay | Attending: Emergency Medicine | Admitting: Emergency Medicine

## 2017-08-30 ENCOUNTER — Encounter (HOSPITAL_COMMUNITY): Payer: Self-pay | Admitting: Emergency Medicine

## 2017-08-30 DIAGNOSIS — Y9389 Activity, other specified: Secondary | ICD-10-CM | POA: Insufficient documentation

## 2017-08-30 DIAGNOSIS — J45909 Unspecified asthma, uncomplicated: Secondary | ICD-10-CM | POA: Insufficient documentation

## 2017-08-30 DIAGNOSIS — S0012XA Contusion of left eyelid and periocular area, initial encounter: Secondary | ICD-10-CM

## 2017-08-30 DIAGNOSIS — S0081XA Abrasion of other part of head, initial encounter: Secondary | ICD-10-CM

## 2017-08-30 DIAGNOSIS — Y929 Unspecified place or not applicable: Secondary | ICD-10-CM | POA: Insufficient documentation

## 2017-08-30 DIAGNOSIS — S0083XA Contusion of other part of head, initial encounter: Secondary | ICD-10-CM

## 2017-08-30 DIAGNOSIS — Y999 Unspecified external cause status: Secondary | ICD-10-CM | POA: Insufficient documentation

## 2017-08-30 DIAGNOSIS — F909 Attention-deficit hyperactivity disorder, unspecified type: Secondary | ICD-10-CM | POA: Insufficient documentation

## 2017-08-30 DIAGNOSIS — Z79899 Other long term (current) drug therapy: Secondary | ICD-10-CM | POA: Insufficient documentation

## 2017-08-30 NOTE — ED Triage Notes (Addendum)
Pt states woke up with left eye lid swollen. Obvious swelling noted. No redness to sclera, no eye drainage present. Pt states vision is normal. Also states she feels like the skin to her forehead is swollen. Vitals stable. Denies ear pain, denies throat pain. Pt does have several food allergies but states it does not feel like an allergic reaction, denies throat or tongue swelling, denies shortness of breath.
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
## 2017-10-24 ENCOUNTER — Encounter (HOSPITAL_COMMUNITY): Payer: Self-pay | Admitting: Emergency Medicine

## 2017-10-24 ENCOUNTER — Emergency Department (HOSPITAL_COMMUNITY)
Admission: EM | Admit: 2017-10-24 | Discharge: 2017-10-25 | Disposition: A | Discharge status: Home / Self Care | Payer: Medicaid Other | Attending: Emergency Medicine | Admitting: Emergency Medicine

## 2017-10-24 DIAGNOSIS — J45909 Unspecified asthma, uncomplicated: Secondary | ICD-10-CM | POA: Insufficient documentation

## 2017-10-24 DIAGNOSIS — Y990 Civilian activity done for income or pay: Secondary | ICD-10-CM | POA: Insufficient documentation

## 2017-10-24 DIAGNOSIS — Z79899 Other long term (current) drug therapy: Secondary | ICD-10-CM | POA: Insufficient documentation

## 2017-10-24 DIAGNOSIS — R102 Pelvic and perineal pain: Secondary | ICD-10-CM | POA: Insufficient documentation

## 2017-10-24 DIAGNOSIS — Z23 Encounter for immunization: Secondary | ICD-10-CM | POA: Insufficient documentation

## 2017-10-24 DIAGNOSIS — Y9389 Activity, other specified: Secondary | ICD-10-CM | POA: Insufficient documentation

## 2017-10-24 DIAGNOSIS — Y9289 Other specified places as the place of occurrence of the external cause: Secondary | ICD-10-CM | POA: Insufficient documentation

## 2017-10-24 DIAGNOSIS — N760 Acute vaginitis: Secondary | ICD-10-CM | POA: Insufficient documentation

## 2017-10-24 DIAGNOSIS — W268XXA Contact with other sharp object(s), not elsewhere classified, initial encounter: Secondary | ICD-10-CM | POA: Insufficient documentation

## 2017-10-24 DIAGNOSIS — B9689 Other specified bacterial agents as the cause of diseases classified elsewhere: Secondary | ICD-10-CM | POA: Insufficient documentation

## 2017-10-24 DIAGNOSIS — R300 Dysuria: Secondary | ICD-10-CM | POA: Insufficient documentation

## 2017-10-24 DIAGNOSIS — S61212A Laceration without foreign body of right middle finger without damage to nail, initial encounter: Secondary | ICD-10-CM | POA: Insufficient documentation

## 2017-10-24 NOTE — ED Triage Notes (Addendum)
Reports being at work when a plastic broom split causing a cut to the right middle finger.  Bleeding controlled at this time.  At the end of triage states while I'm here I need to be checked for a UTI.  C/o Suprapubic pain for two weeks.

## 2017-10-25 ENCOUNTER — Emergency Department (HOSPITAL_COMMUNITY): Payer: Medicaid Other

## 2017-10-25 LAB — URINALYSIS, ROUTINE W REFLEX MICROSCOPIC
BILIRUBIN URINE: NEGATIVE
GLUCOSE, UA: NEGATIVE mg/dL
HGB URINE DIPSTICK: NEGATIVE
Ketones, ur: NEGATIVE mg/dL
Leukocytes, UA: NEGATIVE
Nitrite: NEGATIVE
PROTEIN: NEGATIVE mg/dL
Specific Gravity, Urine: 1.008 (ref 1.005–1.030)
pH: 7 (ref 5.0–8.0)

## 2017-10-25 LAB — WET PREP, GENITAL
Sperm: NONE SEEN
Trich, Wet Prep: NONE SEEN
Yeast Wet Prep HPF POC: NONE SEEN

## 2017-10-25 LAB — POC URINE PREG, ED: Preg Test, Ur: NEGATIVE

## 2017-10-25 MED ORDER — METRONIDAZOLE 500 MG PO TABS: 500.0000 mg | ORAL_TABLET | Freq: Two times a day (BID) | ORAL | 0 refills | Status: DC

## 2017-10-25 MED ORDER — TETANUS-DIPHTH-ACELL PERTUSSIS 5-2.5-18.5 LF-MCG/0.5 IM SUSP
0.5000 mL | Freq: Once | INTRAMUSCULAR | Status: AC
  Administered 2017-10-25: 0.5 mL via INTRAMUSCULAR
  Filled 2017-10-25: qty 0.5

## 2017-10-25 NOTE — Discharge Instructions (Signed)
Wear your brace on the finger for the next week. It is OK to take this off to shower.

## 2017-10-25 NOTE — ED Provider Notes (Signed)
[facility] Provider Note   CSN: [id number] Arrival date & time: 10/24/17  2303     History   Chief Complaint Chief Complaint  Patient presents with  . Finger Injury  . Abdominal Pain    HPI Alyssa [name] is a 21 y.o. female.  HPI   21 year old female with past medical history as below here with finger injury.  The patient was cleaning at work today.  She is using a metal/composite brim.  She states that the brim broke and sliced her middle finger.  She had mild amount of bleeding at the time.  Pressure was applied she was sent to the ED.  She endorses a sharp, stabbing, pain over the distal tuft of her finger.  Pain is only with palpation or movement.  Denies any distal numbness or weakness.  Denies any difficulty moving or bending the finger.  She denies any other trauma.  Of note, the patient also reports intermittent dysuria for several weeks.  Denies any other abdominal pain.  No fever.  No nausea or vomiting.  She has a history of the same, but would like to be checked for UTI.  Past Medical History:  Diagnosis Date  . ADHD (attention deficit hyperactivity disorder)   . Angio-edema   . Asthma   . Eczema   . Recurrent upper respiratory infection (URI)   . Urticaria     Patient Active Problem List   Diagnosis Date Noted  . Food allergy 02/11/2016  . Seasonal and perennial allergic rhinoconjunctivitis 02/11/2016  . Atopic dermatitis 02/11/2016  . Intermittent coughing/wheezing 02/11/2016    Past Surgical History:  Procedure Laterality Date  . WISDOM TOOTH EXTRACTION      OB History    Gravida Para Term Preterm AB Living   0 0 0 0 0 0   SAB TAB Ectopic Multiple Live Births   0 0 0 0 0       Home Medications    Prior to Admission medications   Medication Sig Start Date End Date Taking? Authorizing Provider  EPINEPHrine 0.3 mg/0.3 mL IJ SOAJ injection Inject 0.3 mLs (0.3 mg total) into the muscle once.  02/11/16  Yes Bobbitt, Heywood Ilesalph Carter, MD  albuterol (PROAIR HFA) 108 (90 Base) MCG/ACT inhaler TWO PUFFS EVERY 4-6 HOURS AS NEEDED FOR COUGH OR WHEEZE. Patient not taking: Reported on 10/25/2017 02/11/16   Bobbitt, Heywood Ilesalph Carter, MD  fluconazole (DIFLUCAN) 150 MG tablet Take 1 tablet (150 mg total) by mouth daily. Patient not taking: Reported on 10/25/2017 07/31/17   Judeth HornLawrence, Erin, NP  fluticasone [facility](FLONASE) 50 MCG/ACT nasal spray Place 2 sprays into both nostrils daily. Patient not taking: Reported on 10/25/2017 04/22/17   Everlene Farrieransie, William, PA-C  metroNIDAZOLE (FLAGYL) 500 MG tablet Take 1 tablet (500 mg total) by mouth 2 (two) times daily. 10/25/17   Shaune PollackIsaacs, Krishang Reading, MD    Family History Family History  Problem Relation Age of Onset  . Asthma Mother   . Eczema Mother   . Allergic rhinitis Mother   . Angioedema Mother   . Asthma Brother   . Asthma Brother   . Allergic rhinitis Brother   . Urticaria Neg Hx   . Immunodeficiency Neg Hx     Social History Social History   Tobacco Use  . Smoking status: Never Smoker  . Smokeless tobacco: Never Used  Substance Use Topics  . Alcohol use: No  . Drug use: No     Allergies   Banana; Peach  flavor; and Shellfish allergy   Review of Systems Review of Systems  Genitourinary: Positive for dysuria and frequency.  Skin: Positive for wound.  All other systems reviewed and are negative.    Physical Exam Updated Vital Signs BP 101/78 (BP Location: Left Arm)   Pulse (!) 58   Temp 98.2 F (36.8 C) (Oral)   Resp 14   Ht 5' 2.75" (1.594 m)   Wt 68.9 kg (152 lb)   LMP 10/04/2017   SpO2 100%   BMI 27.14 kg/m   Physical Exam  Constitutional: She is oriented to person, place, and time. She appears well-developed and well-nourished. No distress.  HENT:  Head: Normocephalic and atraumatic.  Eyes: Conjunctivae are normal.  Neck: Neck supple.  Cardiovascular: Normal rate, regular rhythm and normal heart sounds. Exam reveals no friction rub.    No murmur heard. Pulmonary/Chest: Effort normal and breath sounds normal. No respiratory distress. She has no wheezes. She has no rales.  Abdominal: She exhibits no distension.  No abdominal tenderness.  Specifically, no right lower quadrant or suprapubic tenderness.  Genitourinary:  Genitourinary Comments: Mild white vaginal discharge. No CMT. No adnexal lesions.  Musculoskeletal: She exhibits no edema.  Neurological: She is alert and oriented to person, place, and time. She exhibits normal muscle tone.  Skin: Skin is warm. Capillary refill takes less than 2 seconds.  Psychiatric: She has a normal mood and affect.  Nursing note and vitals reviewed.   UPPER EXTREMITY EXAM: RIGHT  INSPECTION & PALPATION: Superficial skin flap/laceration to distal tuft of digit, involving superficial dermis only. No involvement of joint space. No bleeding.  SENSORY: Sensation is intact to light touch in:  Superficial radial nerve distribution (dorsal first web space) Median nerve distribution (tip of index finger)   Ulnar nerve distribution (tip of small finger)     MOTOR:  + Motor posterior interosseous nerve (thumb IP extension) + Anterior interosseous nerve (thumb IP flexion, index finger DIP flexion) + Radial nerve (wrist extension) + Median nerve (palpable firing thenar mass) + Ulnar nerve (palpable firing of first dorsal interosseous muscle)  VASCULAR: 2+ radial pulse Brisk capillary refill < 2 sec, fingers warm and well-perfused  TENDONS: Tested individual flexion at MCP, PIP and DIP joints of middle finger and intact Tested extension of DIP/PIP joints of middle finger is intact   ED Treatments / Results  Labs (all labs ordered are listed, but only abnormal results are displayed) Labs Reviewed  WET PREP, GENITAL - Abnormal; Notable for the following components:      Result Value   Clue Cells Wet Prep HPF POC PRESENT (*)    WBC, Wet Prep HPF POC MANY (*)    All other components  within normal limits  URINALYSIS, ROUTINE W REFLEX MICROSCOPIC - Abnormal; Notable for the following components:   Color, Urine STRAW (*)    All other components within normal limits  POC URINE PREG, ED  GC/CHLAMYDIA PROBE AMP () NOT AT [facility]    EKG  EKG Interpretation None       Radiology Dg Finger Middle Right  Result Date: 10/25/2017 CLINICAL DATA:  Laceration to the middle finger EXAM: RIGHT MIDDLE FINGER 2+V COMPARISON:  None. FINDINGS: There is no evidence of fracture or dislocation. There is no evidence of arthropathy or other focal bone abnormality. Soft tissues are unremarkable. IMPRESSION: Negative. Electronically Signed   By: Jasmine Pang M.D.   On: 10/25/2017 00:59    Procedures Procedures (including critical care time)  Medications Ordered in ED Medications  Tdap (BOOSTRIX) injection 0.5 mL (0.5 mLs Intramuscular Given 10/25/17 0317)     Initial Impression / Assessment and Plan / ED Course  I have reviewed the triage vital signs and the nursing notes.  Pertinent labs & imaging results that were available during my care of the patient were reviewed by me and considered in my medical decision making (see chart for details).     21 year old female here with finger laceration as well as complaints of intermittent pelvic pain.  Regarding her finger laceration, this is more of a superficial flap avulsion.  This was secured with Dermabond.  There are no deep or gaping wounds to necessitate sutures.  Patient will be placed in a finger splint to facilitate healing.  Tetanus updated.  Regarding her pelvic pain, this is intermittent has been going on for months.  Her pelvic exam does show yellow white vaginal discharge with positive clue cells on wet prep, consistent with BV.  She had no overt cervical motion tenderness or adnexal tenderness to suggest PID.  Urinalysis is without UTI.  Will treat her for BV and refer her to her with OB/GYN. No RLQ TTP or signs of  appendicitis.  This note was prepared with assistance of Conservation officer, historic buildingsDragon voice recognition software. Occasional wrong-word or sound-a-like substitutions may have occurred due to the inherent limitations of voice recognition software.   Final Clinical Impressions(s) / ED Diagnoses   Final diagnoses:  BV (bacterial vaginosis)  Laceration of right middle finger without foreign body without damage to nail, initial encounter    ED Discharge Orders        Ordered    metroNIDAZOLE (FLAGYL) 500 MG tablet  2 times daily     10/25/17 0250       Shaune PollackIsaacs, Kendyl Festa, MD 10/25/17 [phone]

## 2017-10-26 LAB — GC/CHLAMYDIA PROBE AMP (~~LOC~~) NOT AT ARMC
Chlamydia: NEGATIVE
Neisseria Gonorrhea: NEGATIVE

## 2018-09-10 ENCOUNTER — Other Ambulatory Visit: Payer: Self-pay

## 2018-09-10 ENCOUNTER — Inpatient Hospital Stay (HOSPITAL_COMMUNITY): Payer: Medicaid Other

## 2018-09-10 ENCOUNTER — Encounter (HOSPITAL_COMMUNITY): Payer: Self-pay | Admitting: *Deleted

## 2018-09-10 ENCOUNTER — Inpatient Hospital Stay (HOSPITAL_COMMUNITY)
Admission: AD | Admit: 2018-09-10 | Discharge: 2018-09-10 | Disposition: A | Discharge status: Home / Self Care | Payer: Medicaid Other | Source: Ambulatory Visit | Attending: Obstetrics and Gynecology | Admitting: Obstetrics and Gynecology

## 2018-09-10 DIAGNOSIS — O208 Other hemorrhage in early pregnancy: Secondary | ICD-10-CM | POA: Insufficient documentation

## 2018-09-10 DIAGNOSIS — Z3A01 Less than 8 weeks gestation of pregnancy: Secondary | ICD-10-CM | POA: Diagnosis not present

## 2018-09-10 DIAGNOSIS — E876 Hypokalemia: Secondary | ICD-10-CM

## 2018-09-10 DIAGNOSIS — R109 Unspecified abdominal pain: Secondary | ICD-10-CM | POA: Diagnosis not present

## 2018-09-10 DIAGNOSIS — Z79899 Other long term (current) drug therapy: Secondary | ICD-10-CM | POA: Diagnosis not present

## 2018-09-10 DIAGNOSIS — O26899 Other specified pregnancy related conditions, unspecified trimester: Secondary | ICD-10-CM

## 2018-09-10 DIAGNOSIS — R42 Dizziness and giddiness: Secondary | ICD-10-CM | POA: Diagnosis not present

## 2018-09-10 DIAGNOSIS — J45909 Unspecified asthma, uncomplicated: Secondary | ICD-10-CM | POA: Diagnosis not present

## 2018-09-10 DIAGNOSIS — R1032 Left lower quadrant pain: Secondary | ICD-10-CM | POA: Diagnosis present

## 2018-09-10 DIAGNOSIS — O99341 Other mental disorders complicating pregnancy, first trimester: Secondary | ICD-10-CM | POA: Insufficient documentation

## 2018-09-10 DIAGNOSIS — O99511 Diseases of the respiratory system complicating pregnancy, first trimester: Secondary | ICD-10-CM | POA: Insufficient documentation

## 2018-09-10 DIAGNOSIS — O99281 Endocrine, nutritional and metabolic diseases complicating pregnancy, first trimester: Secondary | ICD-10-CM | POA: Insufficient documentation

## 2018-09-10 DIAGNOSIS — O211 Hyperemesis gravidarum with metabolic disturbance: Secondary | ICD-10-CM | POA: Insufficient documentation

## 2018-09-10 DIAGNOSIS — O26891 Other specified pregnancy related conditions, first trimester: Secondary | ICD-10-CM

## 2018-09-10 DIAGNOSIS — F909 Attention-deficit hyperactivity disorder, unspecified type: Secondary | ICD-10-CM | POA: Diagnosis not present

## 2018-09-10 DIAGNOSIS — O210 Mild hyperemesis gravidarum: Secondary | ICD-10-CM | POA: Diagnosis not present

## 2018-09-10 DIAGNOSIS — K117 Disturbances of salivary secretion: Secondary | ICD-10-CM

## 2018-09-10 LAB — CBC
HEMATOCRIT: 35.8 % — AB (ref 36.0–46.0)
Hemoglobin: 12.6 g/dL (ref 12.0–15.0)
MCH: 29.7 pg (ref 26.0–34.0)
MCHC: 35.2 g/dL (ref 30.0–36.0)
MCV: 84.4 fL (ref 80.0–100.0)
Platelets: 194 10*3/uL (ref 150–400)
RBC: 4.24 MIL/uL (ref 3.87–5.11)
RDW: 12.1 % (ref 11.5–15.5)
WBC: 7.3 10*3/uL (ref 4.0–10.5)
nRBC: 0 % (ref 0.0–0.2)

## 2018-09-10 LAB — POCT PREGNANCY, URINE: PREG TEST UR: POSITIVE — AB

## 2018-09-10 LAB — COMPREHENSIVE METABOLIC PANEL
ALT: 13 U/L (ref 0–44)
ANION GAP: 7 (ref 5–15)
AST: 16 U/L (ref 15–41)
Albumin: 3.8 g/dL (ref 3.5–5.0)
Alkaline Phosphatase: 44 U/L (ref 38–126)
BUN: <5 mg/dL — ABNORMAL LOW (ref 6–20)
CO2: 24 mmol/L (ref 22–32)
Calcium: 8.9 mg/dL (ref 8.9–10.3)
Chloride: 103 mmol/L (ref 98–111)
Creatinine, Ser: 0.65 mg/dL (ref 0.44–1.00)
GFR calc Af Amer: >60 mL/min (ref >60)
Glucose, Bld: 83 mg/dL (ref 70–99)
POTASSIUM: 3.2 mmol/L — AB (ref 3.5–5.1)
Sodium: 134 mmol/L — ABNORMAL LOW (ref 135–145)
Total Bilirubin: 0.6 mg/dL (ref 0.3–1.2)
Total Protein: 7 g/dL (ref 6.5–8.1)

## 2018-09-10 LAB — URINALYSIS, ROUTINE W REFLEX MICROSCOPIC
Bilirubin Urine: NEGATIVE
Glucose, UA: NEGATIVE mg/dL
Hgb urine dipstick: NEGATIVE
Ketones, ur: NEGATIVE mg/dL
Leukocytes, UA: NEGATIVE
NITRITE: NEGATIVE
Protein, ur: NEGATIVE mg/dL
Specific Gravity, Urine: 1.014 (ref 1.005–1.030)
pH: 7 (ref 5.0–8.0)

## 2018-09-10 LAB — WET PREP, GENITAL
Sperm: NONE SEEN
Trich, Wet Prep: NONE SEEN
YEAST WET PREP: NONE SEEN

## 2018-09-10 LAB — HCG, QUANTITATIVE, PREGNANCY: hCG, Beta Chain, Quant, S: 28676 m[IU]/mL — ABNORMAL HIGH (ref <5)

## 2018-09-10 MED ORDER — GLYCOPYRROLATE 1 MG PO TABS
1.0000 mg | ORAL_TABLET | Freq: Three times a day (TID) | ORAL | Status: DC
  Administered 2018-09-10: 1 mg via ORAL
  Filled 2018-09-10 (×3): qty 1

## 2018-09-10 MED ORDER — LACTATED RINGERS IV BOLUS
1000.0000 mL | Freq: Once | INTRAVENOUS | Status: AC
  Administered 2018-09-10: 1000 mL via INTRAVENOUS

## 2018-09-10 MED ORDER — POTASSIUM CHLORIDE 20 MEQ PO PACK: 20.0000 meq | PACK | Freq: Two times a day (BID) | ORAL | 0 refills | Status: DC

## 2018-09-10 MED ORDER — PROMETHAZINE HCL 25 MG PO TABS
25.0000 mg | ORAL_TABLET | Freq: Four times a day (QID) | ORAL | Status: DC | PRN
  Filled 2018-09-10: qty 1

## 2018-09-10 MED ORDER — GLYCOPYRROLATE 1 MG PO TABS: 1.0000 mg | ORAL_TABLET | Freq: Three times a day (TID) | ORAL | 1 refills | Status: DC

## 2018-09-10 MED ORDER — FAMOTIDINE IN NACL 20-0.9 MG/50ML-% IV SOLN
20.0000 mg | Freq: Once | INTRAVENOUS | Status: AC
  Administered 2018-09-10: 20 mg via INTRAVENOUS
  Filled 2018-09-10 (×2): qty 50

## 2018-09-10 MED ORDER — SCOPOLAMINE 1 MG/3DAYS TD PT72
1.0000 | MEDICATED_PATCH | TRANSDERMAL | Status: DC
  Administered 2018-09-10: 1.5 mg via TRANSDERMAL
  Filled 2018-09-10: qty 1

## 2018-09-10 MED ORDER — PROMETHAZINE HCL 25 MG/ML IJ SOLN
25.0000 mg | Freq: Four times a day (QID) | INTRAMUSCULAR | Status: DC | PRN
  Administered 2018-09-10: 25 mg via INTRAVENOUS
  Filled 2018-09-10: qty 1

## 2018-09-10 MED ORDER — PROMETHAZINE HCL 25 MG PO TABS: 12.5000 mg | ORAL_TABLET | Freq: Four times a day (QID) | ORAL | 1 refills | Status: DC | PRN

## 2018-09-10 MED ORDER — SCOPOLAMINE 1 MG/3DAYS TD PT72: 1.0000 | MEDICATED_PATCH | TRANSDERMAL | 12 refills | Status: DC

## 2018-09-10 NOTE — Progress Notes (Signed)
Contacted Child psychotherapist for eval of "depression" sx's due to being informed of positive pregnancy test today. Spoke with [employer]. Pt states,"unsure if will proceed with pregnancy or not at this time". Per Denny Peon, will review note from chaplain who is with the pt at bedside now to determine if social work consult needed.   Adah Perl RN

## 2018-09-10 NOTE — Progress Notes (Signed)
I received a referral from pt's nurse regarding pt discernment about how to proceed with her unplanned pregnancy.  We spoke about her different options and she stated that she planned to carry through with the pregnancy.  I gave her resources for support through [facility] of the [place], [place], the [facility] program that the [facility] offers and helped her think through her personal resources for support.    Chaplain Dyanne Carrel, Bcc Pager, [phone] 4:22 PM    09/10/18 1600  Clinical Encounter Type  Visited With Patient  Visit Type Spiritual support  Referral From Nurse

## 2018-09-10 NOTE — MAU Provider Note (Signed)
History     CSN: [id number]  Arrival date and time: 09/10/18 1253   First Provider Initiated Contact with Patient 09/10/18 1400      Chief Complaint  Patient presents with  . Emesis  . Possible Pregnancy  . Dizziness   G1 @[redacted]w[redacted]d  here with N/V, dizziness, and LLQ pain. N/V started about 2-3 weeks ago. She is vomiting everyday. She is tolerating very little. Associated sx are dizziness and excessive saliva. No syncope. LLQ pain started last week. Describes as pressure. Rates 5-10. Has not taken anything for it. No fevers. No diarrhea. Has not had BM in 10 days. Reports 10 lb weight loss.   OB History    Gravida  1   Para  0   Term  0   Preterm  0   AB  0   Living  0     SAB  0   TAB  0   Ectopic  0   Multiple  0   Live Births  0           Past Medical History:  Diagnosis Date  . ADHD   . ADHD (attention deficit hyperactivity disorder)   . Angio-edema   . Asthma   . Eczema   . Recurrent upper respiratory infection (URI)   . Urticaria     Past Surgical History:  Procedure Laterality Date  . WISDOM TOOTH EXTRACTION      Family History  Problem Relation Age of Onset  . Asthma Mother   . Eczema Mother   . Allergic rhinitis Mother   . Angioedema Mother   . Asthma Brother   . Asthma Brother   . Allergic rhinitis Brother   . Urticaria Neg Hx   . Immunodeficiency Neg Hx     Social History   Tobacco Use  . Smoking status: Never Smoker  . Smokeless tobacco: Never Used  Substance Use Topics  . Alcohol use: No  . Drug use: No    Allergies:  Allergies  Allergen Reactions  . Shellfish Allergy Hives    "Throat closes"  . Banana Itching and Swelling  . Peach Flavor Itching    Medications Prior to Admission  Medication Sig Dispense Refill Last Dose  . albuterol (PROAIR HFA) 108 (90 Base) MCG/ACT inhaler TWO PUFFS EVERY 4-6 HOURS AS NEEDED FOR COUGH OR WHEEZE. (Patient not taking: Reported on 10/25/2017) 1 Inhaler 1 Not Taking at Unknown  time  . EPINEPHrine 0.3 mg/0.3 mL IJ SOAJ injection Inject 0.3 mLs (0.3 mg total) into the muscle once. 2 Device 2 unknown  . fluconazole (DIFLUCAN) 150 MG tablet Take 1 tablet (150 mg total) by mouth daily. (Patient not taking: Reported on 10/25/2017) 1 tablet 1 Completed Course at Unknown time  . fluticasone (FLONASE) 50 MCG/ACT nasal spray Place 2 sprays into both nostrils daily. (Patient not taking: Reported on 10/25/2017) 16 g 0 Not Taking at Unknown time  . metroNIDAZOLE (FLAGYL) 500 MG tablet Take 1 tablet (500 mg total) by mouth 2 (two) times daily. 14 tablet 0     Review of Systems  Constitutional: Negative for fever.  Gastrointestinal: Positive for abdominal pain, constipation, nausea and vomiting. Negative for diarrhea.  Genitourinary: Negative for dysuria and vaginal bleeding.   Physical Exam   Blood pressure 111/78, pulse 72, temperature 98.1 F (36.7 C), temperature source Oral, resp. rate 16, height 5' 2.75" (1.594 m), weight 74.6 kg, last menstrual period 07/28/2018, SpO2 100 %.  Physical Exam  Nursing note and  vitals reviewed. Constitutional: She is oriented to person, place, and time. She appears well-developed and well-nourished.  HENT:  Head: Normocephalic and atraumatic.  Neck: Normal range of motion.  Cardiovascular: Normal rate.  Respiratory: Effort normal. No respiratory distress.  GI: Soft. She exhibits no distension and no mass. There is tenderness in the left lower quadrant. There is no rebound and no guarding.  Genitourinary:  Genitourinary Comments: External: no lesions or erythema Vagina: rugated, pink, moist, mod thick white discharge Uterus: non enlarged, anteverted, non tender, no CMT Adnexae: no masses, + tenderness left, no tenderness right Cervix nml, closed   Musculoskeletal: Normal range of motion.  Neurological: She is alert and oriented to person, place, and time.  Skin: Skin is warm and dry.  Psychiatric: She has a normal mood and affect.    Results for orders placed or performed during the hospital encounter of 09/10/18 (from the past 24 hour(s))  Urinalysis, Routine w reflex microscopic     Status: Abnormal   Collection Time: 09/10/18  1:43 PM  Result Value Ref Range   Color, Urine YELLOW YELLOW   APPearance HAZY (A) CLEAR   Specific Gravity, Urine 1.014 1.005 - 1.030   pH 7.0 5.0 - 8.0   Glucose, UA NEGATIVE NEGATIVE mg/dL   Hgb urine dipstick NEGATIVE NEGATIVE   Bilirubin Urine NEGATIVE NEGATIVE   Ketones, ur NEGATIVE NEGATIVE mg/dL   Protein, ur NEGATIVE NEGATIVE mg/dL   Nitrite NEGATIVE NEGATIVE   Leukocytes, UA NEGATIVE NEGATIVE  Pregnancy, urine POC     Status: Abnormal   Collection Time: 09/10/18  1:46 PM  Result Value Ref Range   Preg Test, Ur POSITIVE (A) NEGATIVE  hCG, quantitative, pregnancy     Status: Abnormal   Collection Time: 09/10/18  2:04 PM  Result Value Ref Range   hCG, Beta Chain, Quant, S 28,676 (H) <5 mIU/mL  CBC     Status: Abnormal   Collection Time: 09/10/18  2:04 PM  Result Value Ref Range   WBC 7.3 4.0 - 10.5 K/uL   RBC 4.24 3.87 - 5.11 MIL/uL   Hemoglobin 12.6 12.0 - 15.0 g/dL   HCT 40.9 (L) 81.1 - 91.4 %   MCV 84.4 80.0 - 100.0 fL   MCH 29.7 26.0 - 34.0 pg   MCHC 35.2 30.0 - 36.0 g/dL   RDW 78.2 95.6 - 21.3 %   Platelets 194 150 - 400 K/uL   nRBC 0.0 0.0 - 0.2 %  Comprehensive metabolic panel     Status: Abnormal   Collection Time: 09/10/18  2:04 PM  Result Value Ref Range   Sodium 134 (L) 135 - 145 mmol/L   Potassium 3.2 (L) 3.5 - 5.1 mmol/L   Chloride 103 98 - 111 mmol/L   CO2 24 22 - 32 mmol/L   Glucose, Bld 83 70 - 99 mg/dL   BUN <5 (L) 6 - 20 mg/dL   Creatinine, Ser 0.86 0.44 - 1.00 mg/dL   Calcium 8.9 8.9 - 57.8 mg/dL   Total Protein 7.0 6.5 - 8.1 g/dL   Albumin 3.8 3.5 - 5.0 g/dL   AST 16 15 - 41 U/L   ALT 13 0 - 44 U/L   Alkaline Phosphatase 44 38 - 126 U/L   Total Bilirubin 0.6 0.3 - 1.2 mg/dL   GFR calc non Af Amer >60 >60 mL/min   GFR calc Af Amer >60  >60 mL/min   Anion gap 7 5 - 15  Wet prep, genital  Status: Abnormal   Collection Time: 09/10/18  2:20 PM  Result Value Ref Range   Yeast Wet Prep HPF POC NONE SEEN NONE SEEN   Trich, Wet Prep NONE SEEN NONE SEEN   Clue Cells Wet Prep HPF POC PRESENT (A) NONE SEEN   WBC, Wet Prep HPF POC FEW (A) NONE SEEN   Sperm NONE SEEN    US Ob Less Than 14 Weeks With Ob Transvaginal  Result Date: 09/10/2018 CLINICAL DATA:  Abdominal pain and pressure. Positive pregnancy test. EXAM: OBSTETRIC <14 WK [place] AND TRANSVAGINAL OB US TECHNIQUE: Both transabdominal and transvaginal ultrasound examinations were performed for complete evaluation of the gestation as well as the maternal uterus, adnexal regions, and pelvic cul-de-sac. Transvaginal technique was performed to assess early pregnancy. COMPARISON:  None. FINDINGS: Intrauterine gestational sac: Single Yolk sac:  Visualized. Embryo:  Visualized. Cardiac Activity: Visualized. Heart Rate: 107 bpm CRL: 3.7 mm   6 w   0 d                  [place] EDC: 05/05/2019 Subchorionic hemorrhage: Moderate in size measuring 1.3 x 1.7 x 1.3 cm. Maternal uterus/adnexae: Normal appearance of the ovaries. IMPRESSION: Single live intrauterine pregnancy corresponding to 6 weeks and 0 days gestation. Moderate in size subchorionic hemorrhage. Electronically Signed   By: Ted Mcalpine M.D.   On: 09/10/2018 17:49   MAU Course  Procedures Phenergan Robinul  MDM Labs and [place] ordered and reviewed. Had emesis x1. Tolerating po after IVF and meds. Feels better. Stable for discharge home.  Assessment and Plan   1. [redacted] weeks gestation of pregnancy   2. Abdominal pain in pregnancy   3. Morning sickness   4. Hypokalemia   5. Ptyalism    Discharge home Follow up with OB provider of choice to start care Rx Phenergan Rx Robinul Rx Scop patch Rx Kchlor  Allergies as of 09/10/2018      Reactions   Shellfish Allergy Hives   "Throat closes"   Banana Itching, Swelling   Peach  Flavor Itching      Medication List    STOP taking these medications   fluconazole 150 MG tablet Commonly known as:  DIFLUCAN   metroNIDAZOLE 500 MG tablet Commonly known as:  FLAGYL     TAKE these medications   albuterol 108 (90 Base) MCG/ACT inhaler Commonly known as:  PROVENTIL HFA;VENTOLIN HFA TWO PUFFS EVERY 4-6 HOURS AS NEEDED FOR COUGH OR WHEEZE.   EPINEPHrine 0.3 mg/0.3 mL Soaj injection Commonly known as:  EPI-PEN Inject 0.3 mLs (0.3 mg total) into the muscle once.   fluticasone 50 MCG/ACT nasal spray Commonly known as:  FLONASE Place 2 sprays into both nostrils daily.   glycopyrrolate 1 MG tablet Commonly known as:  ROBINUL Take 1 tablet (1 mg total) by mouth 3 (three) times daily. For spitting   potassium chloride 20 MEQ packet Commonly known as:  KLOR-CON Take 20 mEq by mouth 2 (two) times daily.   promethazine 25 MG tablet Commonly known as:  PHENERGAN Take 0.5-1 tablets (12.5-25 mg total) by mouth every 6 (six) hours as needed for nausea or vomiting.   scopolamine 1 MG/3DAYS Commonly known as:  TRANSDERM-SCOP Place 1 patch (1.5 mg total) onto the skin every 3 (three) days. Start taking on:  09/13/2018       Donette Larry, CNM 09/10/2018, 6:14 PM

## 2018-09-10 NOTE — Discharge Instructions (Signed)
Morning Sickness Morning sickness is when you feel sick to your stomach (nauseous) during pregnancy. You may feel sick to your stomach and throw up (vomit). You may feel sick in the morning, but you can feel this way any time of day. Some women feel very sick to their stomach and cannot stop throwing up (hyperemesis gravidarum). Follow these instructions at home:  Only take medicines as told by your doctor.  Take multivitamins as told by your doctor. Taking multivitamins before getting pregnant can stop or lessen the harshness of morning sickness.  Eat dry toast or unsalted crackers before getting out of bed.  Eat 5 to 6 small meals a day.  Eat dry and bland foods like rice and baked potatoes.  Do not drink liquids with meals. Drink between meals.  Do not eat greasy, fatty, or spicy foods.  Have someone cook for you if the smell of food causes you to feel sick or throw up.  If you feel sick to your stomach after taking prenatal vitamins, take them at night or with a snack.  Eat protein when you need a snack (nuts, yogurt, cheese).  Eat unsweetened gelatins for dessert.  Wear a bracelet used for sea sickness (acupressure wristband).  Go to a doctor that puts thin needles into certain body points (acupuncture) to improve how you feel.  Do not smoke.  Use a humidifier to keep the air in your house free of odors.  Get lots of fresh air. Contact a doctor if:  You need medicine to feel better.  You feel dizzy or lightheaded.  You are losing weight. Get help right away if:  You feel very sick to your stomach and cannot stop throwing up.  You pass out (faint). This information is not intended to replace advice given to you by your health care provider. Make sure you discuss any questions you have with your health care provider. Document Released: 12/18/2004 Document Revised: 04/17/2016 Document Reviewed: 04/27/2013 Elsevier Interactive Patient Education  2017 Elsevier  Inc.   Hypokalemia Hypokalemia means that the amount of potassium in the blood is lower than normal.Potassium is a chemical that helps regulate the amount of fluid in the body (electrolyte). It also stimulates muscle tightening (contraction) and helps nerves work properly.Normally, most of the bodys potassium is inside of cells, and only a very small amount is in the blood. Because the amount in the blood is so small, minor changes to potassium levels in the blood can be life-threatening. What are the causes? This condition may be caused by:  Antibiotic medicine.  Diarrhea or vomiting. Taking too much of a medicine that helps you have a bowel movement (laxative) can cause diarrhea and lead to hypokalemia.  Chronic kidney disease (CKD).  Medicines that help the body get rid of excess fluid (diuretics).  Eating disorders, such as bulimia.  Low magnesium levels in the body.  Sweating a lot.  What are the signs or symptoms? Symptoms of this condition include:  Weakness.  Constipation.  Fatigue.  Muscle cramps.  Mental confusion.  Skipped heartbeats or irregular heartbeat (palpitations).  Tingling or numbness.  How is this diagnosed? This condition is diagnosed with a blood test. How is this treated? Hypokalemia can be treated by taking potassium supplements by mouth or adjusting the medicines that you take. Treatment may also include eating more foods that contain a lot of potassium. If your potassium level is very low, you may need to get potassium through an IV tube in one  of your veins and be monitored in the hospital. Follow these instructions at home:  Take over-the-counter and prescription medicines only as told by your health care provider. This includes vitamins and supplements.  Eat a healthy diet. A healthy diet includes fresh fruits and vegetables, whole grains, healthy fats, and lean proteins.  If instructed, eat more foods that contain a lot of potassium,  such as: ? Nuts, such as peanuts and pistachios. ? Seeds, such as sunflower seeds and pumpkin seeds. ? Peas, lentils, and lima beans. ? Whole grain and bran cereals and breads. ? Fresh fruits and vegetables, such as apricots, avocado, bananas, cantaloupe, kiwi, oranges, tomatoes, asparagus, and potatoes. ? Orange juice. ? Tomato juice. ? Red meats. ? Yogurt.  Keep all follow-up visits as told by your health care provider. This is important. Contact a health care provider if:  You have weakness that gets worse.  You feel your heart pounding or racing.  You vomit.  You have diarrhea.  You have diabetes (diabetes mellitus) and you have trouble keeping your blood sugar (glucose) in your target range. Get help right away if:  You have chest pain.  You have shortness of breath.  You have vomiting or diarrhea that lasts for more than 2 days.  You faint. This information is not intended to replace advice given to you by your health care provider. Make sure you discuss any questions you have with your health care provider. Document Released: 11/10/2005 Document Revised: 06/28/2016 Document Reviewed: 06/28/2016 Elsevier Interactive Patient Education  2018 [employer].  [place] [facility] Providers     [facility]     Phone: [phone]  [facility] [employer] [facility]  Phone: [phone]  [facility] [employer] [facility] [place]  Phone: [phone]  [facility] [employer] [facility] [place]                           Phone: [phone]  [facility]          Phone: [phone]  [facility]    Phone: [phone]   [facility] [place])    Phone: [phone]  [facility]    Phone: [phone]  [facility]    Phone: [phone]  [facility]    Phone: [phone]  [facility]  Phone: [phone]  [facility]               Phone: [phone]  [facility]   Phone: [phone]  [facility]    Phone: [phone]

## 2018-09-10 NOTE — Progress Notes (Signed)
Pt expressed need to speak with someone about her emotions surrounding her positive pregnancy test today. Pt states," I feel depressed and don't know whether I want to proceed with the pregnancy at this time or not". Social work consult requested and chaplain services. Spoke with Katie from chaplain services and will come to visit pt in MAU.   Adah Perl RN

## 2018-09-10 NOTE — MAU Note (Signed)
Keep getting dizzy and light headed.  Always throwing up. Not working out for her.  Is pregnant, confirmed at [facility].  Some pain in lower abd.

## 2018-09-13 LAB — GC/CHLAMYDIA PROBE AMP (~~LOC~~) NOT AT ARMC
Chlamydia: NEGATIVE
NEISSERIA GONORRHEA: NEGATIVE

## 2018-10-04 ENCOUNTER — Inpatient Hospital Stay (HOSPITAL_COMMUNITY)
Admission: AD | Admit: 2018-10-04 | Discharge: 2018-10-04 | Disposition: A | Discharge status: Home / Self Care | Payer: Medicaid Other | Source: Ambulatory Visit | Attending: Obstetrics & Gynecology | Admitting: Obstetrics & Gynecology

## 2018-10-04 ENCOUNTER — Other Ambulatory Visit: Payer: Self-pay

## 2018-10-04 ENCOUNTER — Encounter (HOSPITAL_COMMUNITY): Payer: Self-pay | Admitting: *Deleted

## 2018-10-04 DIAGNOSIS — Z3A09 9 weeks gestation of pregnancy: Secondary | ICD-10-CM | POA: Insufficient documentation

## 2018-10-04 DIAGNOSIS — O209 Hemorrhage in early pregnancy, unspecified: Secondary | ICD-10-CM | POA: Insufficient documentation

## 2018-10-04 DIAGNOSIS — O208 Other hemorrhage in early pregnancy: Secondary | ICD-10-CM | POA: Diagnosis not present

## 2018-10-04 DIAGNOSIS — R300 Dysuria: Secondary | ICD-10-CM | POA: Diagnosis not present

## 2018-10-04 LAB — URINALYSIS, ROUTINE W REFLEX MICROSCOPIC
Bilirubin Urine: NEGATIVE
Glucose, UA: NEGATIVE mg/dL
HGB URINE DIPSTICK: NEGATIVE
Ketones, ur: NEGATIVE mg/dL
Leukocytes, UA: NEGATIVE
Nitrite: NEGATIVE
Protein, ur: NEGATIVE mg/dL
SPECIFIC GRAVITY, URINE: 1.006 (ref 1.005–1.030)
pH: 6 (ref 5.0–8.0)

## 2018-10-04 NOTE — Discharge Instructions (Signed)
[place] [facility]iders  ° ° °[facility]       Phone: [phone] ° °[facility] [place] Phone: [phone] ° °[facility] [place]  Phone: [phone] ° °[facility] [place]  Phone: [phone] ° °[facility] [place]  Phone: [phone] ° °[facility]       Phone: [phone] ° °[facility]    Phone: [phone]  ° °[facility] ([place])    Phone: [phone] ° °[facility]    Phone: [phone] ° °[facility]    Phone: [phone] ° °[facility]    Phone: [phone] ° °[facility]       Phone: [phone]  ° °[facility]  Phone: [phone] ° °[facility]    Phone: [phone] ° °[facility]   Phone: [phone] ° °[facility]      Phone: [phone] ° °[facility]    Phone: [phone] ° °Safe Medications in Pregnancy  ° °Acne: °Benzoyl Peroxide °Salicylic Acid ° °Backache/Headache: °Tylenol: 2 regular strength every 4 hours OR °             2 Extra strength every 6 hours ° °Colds/Coughs/Allergies: °Benadryl (alcohol free) 25 mg every 6 hours as needed °Breath right strips °Claritin °Cepacol throat lozenges °Chloraseptic throat spray °Cold-Eeze- up to three times per day °Cough drops, alcohol free °Flonase (by prescription only) °Guaifenesin °Mucinex °Robitussin DM (plain only, alcohol free) °Saline nasal spray/drops °Sudafed (pseudoephedrine) & Actifed ** use only after [redacted] weeks gestation and if you do not have high blood pressure °Tylenol °Vicks Vaporub °Zinc lozenges °Zyrtec  ° °Constipation: °Colace °Ducolax suppositories °Fleet enema °Glycerin suppositories °Metamucil °Milk of  magnesia °Miralax °Senokot °Smooth move tea ° °Diarrhea: °Kaopectate °Imodium A-D ° °*NO pepto Bismol ° °Hemorrhoids: °Anusol °Anusol HC °Preparation H °Tucks ° °Indigestion: °Tums °Maalox °Mylanta °Zantac  °Pepcid ° °Insomnia: °Benadryl (alcohol free) 25mg every 6 hours as needed °Tylenol PM °Unisom, no Gelcaps ° °Leg Cramps: °Tums °MagGel ° °Nausea/Vomiting:  °Bonine °Dramamine °Emetrol °Ginger extract °Sea bands °Meclizine  °Nausea medication to take during pregnancy:  °Unisom (doxylamine succinate 25 mg tablets) Take one tablet daily at bedtime. If symptoms are not adequately controlled, the dose can be increased to a maximum recommended dose of two tablets daily (1/2 tablet in the morning, 1/2 tablet mid-afternoon and one at bedtime). °Vitamin B6 100mg tablets. Take one tablet twice a day (up to 200 mg per day). ° °Skin Rashes: °Aveeno products °Benadryl cream or 25mg every 6 hours as needed °Calamine Lotion °1% cortisone cream ° °Yeast infection: °Gyne-lotrimin 7 °Monistat 7 ° ° °**If taking multiple medications, please check labels to avoid duplicating the same active ingredients °**take medication as directed on the label °** Do not exceed 4000 mg of tylenol in 24 hours °**Do not take medications that contain aspirin or ibuprofen ° ° ° ° °

## 2018-10-04 NOTE — MAU Note (Signed)
Around 0600, woke up and was throwing up, started peeing.  Also started bleeding.  Has seen blood a few times since then when she wipes. Had pain when peed the last time, still hurting

## 2018-10-04 NOTE — MAU Provider Note (Signed)
History     CSN: [id number]  Arrival date and time: 10/04/18 1646   First Provider Initiated Contact with Patient 10/04/18 1726      Chief Complaint  Patient presents with  . Dysuria  . Vaginal Bleeding   HPI Alyssa [name] is a 22 y.o. G1P0000 at [redacted]w[redacted]d who presents with vaginal bleeding and dysuria. She states she is having pain and burning every time she pees. She is also having vaginal bleeding. She denies any pain.   OB History    Gravida  1   Para  0   Term  0   Preterm  0   AB  0   Living  0     SAB  0   TAB  0   Ectopic  0   Multiple  0   Live Births  0           Past Medical History:  Diagnosis Date  . ADHD   . ADHD (attention deficit hyperactivity disorder)   . Angio-edema   . Asthma   . Eczema   . Recurrent upper respiratory infection (URI)   . Urticaria     Past Surgical History:  Procedure Laterality Date  . WISDOM TOOTH EXTRACTION      Family History  Problem Relation Age of Onset  . Asthma Mother   . Eczema Mother   . Allergic rhinitis Mother   . Angioedema Mother   . Asthma Brother   . Asthma Brother   . Allergic rhinitis Brother   . Urticaria Neg Hx   . Immunodeficiency Neg Hx     Social History   Tobacco Use  . Smoking status: Never Smoker  . Smokeless tobacco: Never Used  Substance Use Topics  . Alcohol use: No  . Drug use: Yes    Types: Marijuana    Comment: last used 09-26-18    Allergies:  Allergies  Allergen Reactions  . Shellfish Allergy Hives    "Throat closes"  . Banana Itching and Swelling  . Peach Flavor Itching    Medications Prior to Admission  Medication Sig Dispense Refill Last Dose  . albuterol (PROAIR HFA) 108 (90 Base) MCG/ACT inhaler TWO PUFFS EVERY 4-6 HOURS AS NEEDED FOR COUGH OR WHEEZE. (Patient not taking: Reported on 10/25/2017) 1 Inhaler 1 Not Taking at Unknown time  . EPINEPHrine 0.3 mg/0.3 mL IJ SOAJ injection Inject 0.3 mLs (0.3 mg total) into the muscle once. 2  Device 2 unknown  . fluticasone (FLONASE) 50 MCG/ACT nasal spray Place 2 sprays into both nostrils daily. (Patient not taking: Reported on 10/25/2017) 16 g 0 Not Taking at Unknown time  . glycopyrrolate (ROBINUL) 1 MG tablet Take 1 tablet (1 mg total) by mouth 3 (three) times daily. For spitting 30 tablet 1   . potassium chloride (KLOR-CON) 20 MEQ packet Take 20 mEq by mouth 2 (two) times daily. 10 packet 0   . promethazine (PHENERGAN) 25 MG tablet Take 0.5-1 tablets (12.5-25 mg total) by mouth every 6 (six) hours as needed for nausea or vomiting. 30 tablet 1   . scopolamine (TRANSDERM-SCOP) 1 MG/3DAYS Place 1 patch (1.5 mg total) onto the skin every 3 (three) days. 10 patch 12     Review of Systems  Constitutional: Negative.  Negative for fatigue and fever.  HENT: Negative.   Respiratory: Negative.  Negative for shortness of breath.   Cardiovascular: Negative.  Negative for chest pain.  Gastrointestinal: Negative.  Negative for abdominal pain, constipation, diarrhea, nausea  and vomiting.  Genitourinary: Positive for dysuria and vaginal bleeding. Negative for frequency.  Neurological: Negative.  Negative for dizziness and headaches.   Physical Exam   Blood pressure 118/65, pulse 86, temperature 98.3 F (36.8 C), temperature source Oral, resp. rate 18, weight 76.3 kg, last menstrual period 07/28/2018, SpO2 98 %.  Physical Exam  Nursing note and vitals reviewed. Constitutional: She is oriented to person, place, and time. She appears well-developed and well-nourished. No distress.  HENT:  Head: Normocephalic.  Eyes: Pupils are equal, round, and reactive to light.  Cardiovascular: Normal rate, regular rhythm and normal heart sounds.  Respiratory: Effort normal and breath sounds normal. No respiratory distress.  GI: Soft. Bowel sounds are normal. She exhibits no distension. There is no tenderness.  Genitourinary:  Genitourinary Comments: No vaginal bleeding noted  Neurological: She is  alert and oriented to person, place, and time.  Skin: Skin is warm and dry.  Psychiatric: She has a normal mood and affect. Her behavior is normal. Judgment and thought content normal.    MAU Course  Procedures Results for orders placed or performed during the hospital encounter of 10/04/18 (from the past 24 hour(s))  Urinalysis, Routine w reflex microscopic     Status: Abnormal   Collection Time: 10/04/18  6:27 PM  Result Value Ref Range   Color, Urine YELLOW YELLOW   APPearance HAZY (A) CLEAR   Specific Gravity, Urine 1.006 1.005 - 1.030   pH 6.0 5.0 - 8.0   Glucose, UA NEGATIVE NEGATIVE mg/dL   Hgb urine dipstick NEGATIVE NEGATIVE   Bilirubin Urine NEGATIVE NEGATIVE   Ketones, ur NEGATIVE NEGATIVE mg/dL   Protein, ur NEGATIVE NEGATIVE mg/dL   Nitrite NEGATIVE NEGATIVE   Leukocytes, UA NEGATIVE NEGATIVE   MDM UA  Limited bedside u/s performed- FHR of 171bpm confirmed No signs of UTI on UA No bleeding noted  Assessment and Plan   1. Dysuria   2. Vaginal bleeding affecting early pregnancy   3. [redacted] weeks gestation of pregnancy    -Discharge home in stable condition -First trimester precautions discussed -Patient advised to follow-up with OB of choice to start prenatal care, list given -Patient may return to MAU as needed or if her condition were to change or worsen  Rolm Bookbinder CNM 10/04/2018, 5:26 PM

## 2018-11-04 IMAGING — DX DG CHEST 2V
2 series · 2 of 2 positions shown · non-contrast
Comparison: PA and lateral chest 03/26/2016.

CLINICAL DATA: Intermittent chest pain for 1 week.

EXAM:
CHEST  2 VIEW

[chest pa]
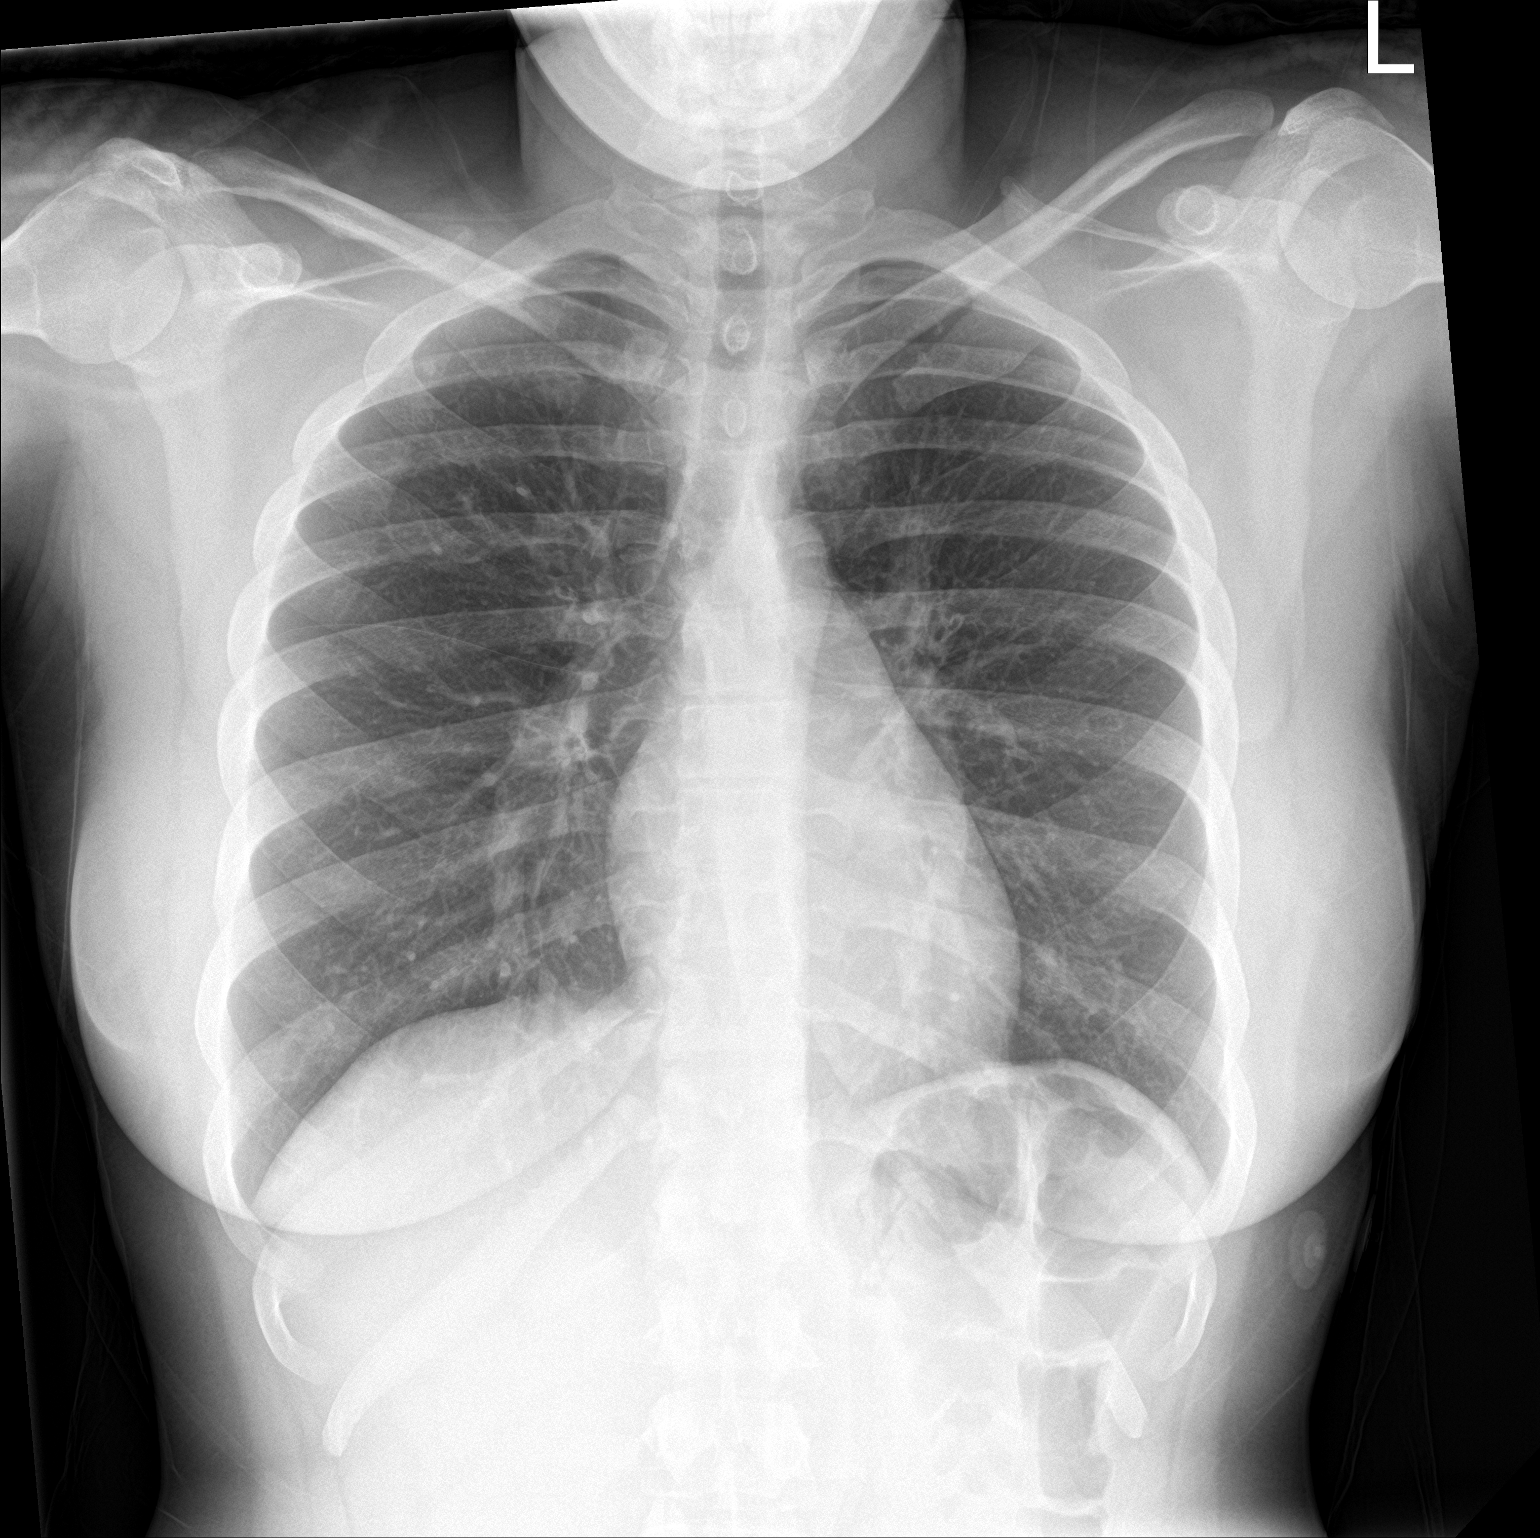

[chest lat]
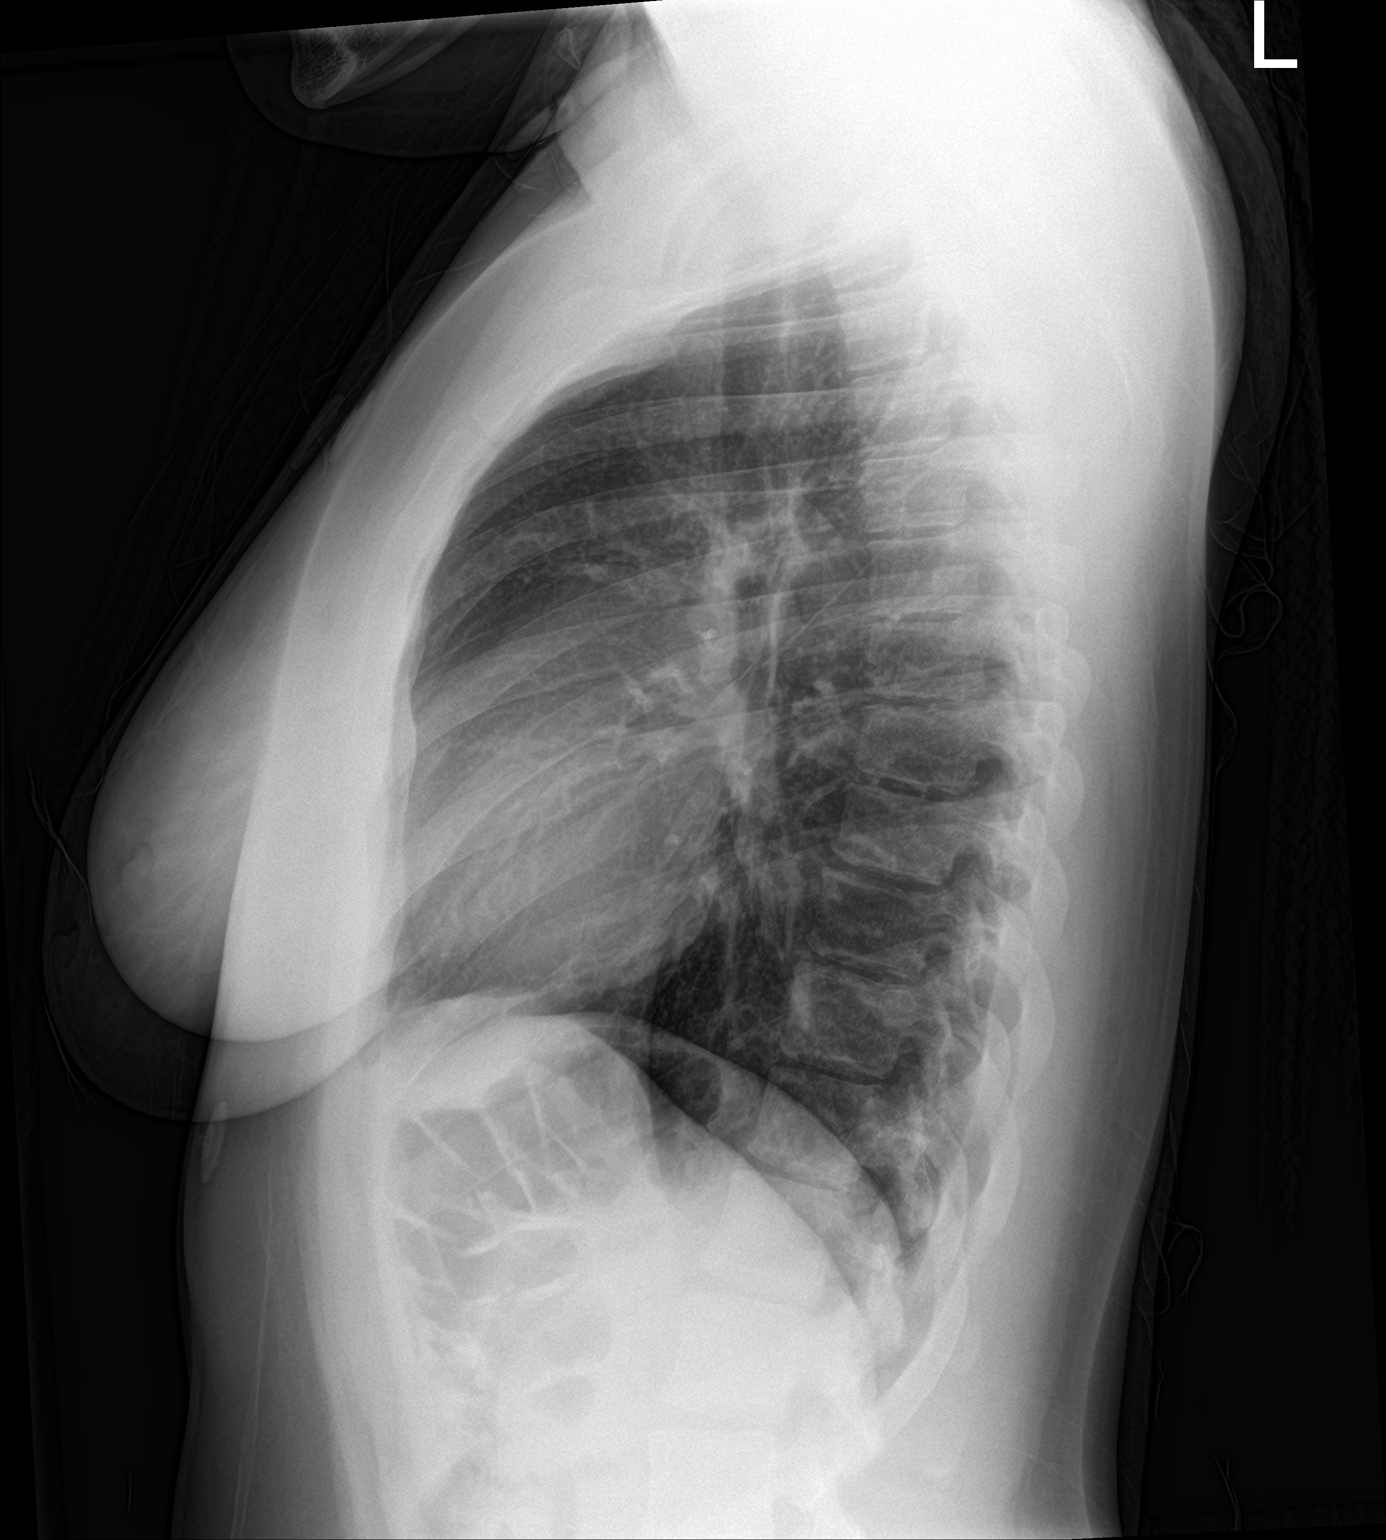

[2 of 2 positions shown; findings below may reference images not displayed]

FINDINGS: The lungs are clear. Heart size is normal. No pneumothorax or
pleural effusion. No bony abnormality.
IMPRESSION: Normal chest.

## 2018-11-20 ENCOUNTER — Inpatient Hospital Stay (HOSPITAL_COMMUNITY)
Admission: AD | Admit: 2018-11-20 | Discharge: 2018-11-20 | Disposition: A | Discharge status: Home / Self Care | Payer: BLUE CROSS/BLUE SHIELD | Source: Ambulatory Visit | Attending: Obstetrics & Gynecology | Admitting: Obstetrics & Gynecology

## 2018-11-20 ENCOUNTER — Encounter (HOSPITAL_COMMUNITY): Payer: Self-pay

## 2018-11-20 DIAGNOSIS — J4521 Mild intermittent asthma with (acute) exacerbation: Secondary | ICD-10-CM | POA: Diagnosis not present

## 2018-11-20 DIAGNOSIS — O26892 Other specified pregnancy related conditions, second trimester: Secondary | ICD-10-CM | POA: Diagnosis not present

## 2018-11-20 DIAGNOSIS — J069 Acute upper respiratory infection, unspecified: Secondary | ICD-10-CM

## 2018-11-20 DIAGNOSIS — O99512 Diseases of the respiratory system complicating pregnancy, second trimester: Secondary | ICD-10-CM | POA: Insufficient documentation

## 2018-11-20 DIAGNOSIS — Z3A16 16 weeks gestation of pregnancy: Secondary | ICD-10-CM | POA: Diagnosis not present

## 2018-11-20 DIAGNOSIS — M549 Dorsalgia, unspecified: Secondary | ICD-10-CM | POA: Diagnosis present

## 2018-11-20 LAB — COMPREHENSIVE METABOLIC PANEL
ALBUMIN: 3.6 g/dL (ref 3.5–5.0)
ALK PHOS: 53 U/L (ref 38–126)
ALT: 13 U/L (ref 0–44)
ANION GAP: 10 (ref 5–15)
AST: 16 U/L (ref 15–41)
BILIRUBIN TOTAL: 0.6 mg/dL (ref 0.3–1.2)
BUN: 7 mg/dL (ref 6–20)
CALCIUM: 8.7 mg/dL — AB (ref 8.9–10.3)
CO2: 21 mmol/L — ABNORMAL LOW (ref 22–32)
Chloride: 100 mmol/L (ref 98–111)
Creatinine, Ser: 0.55 mg/dL (ref 0.44–1.00)
GFR calc Af Amer: >60 mL/min (ref >60)
GFR calc non Af Amer: >60 mL/min (ref >60)
Glucose, Bld: 76 mg/dL (ref 70–99)
Potassium: 3.3 mmol/L — ABNORMAL LOW (ref 3.5–5.1)
Sodium: 131 mmol/L — ABNORMAL LOW (ref 135–145)
TOTAL PROTEIN: 7.5 g/dL (ref 6.5–8.1)

## 2018-11-20 LAB — URINALYSIS, ROUTINE W REFLEX MICROSCOPIC
BILIRUBIN URINE: NEGATIVE
GLUCOSE, UA: NEGATIVE mg/dL
HGB URINE DIPSTICK: NEGATIVE
Ketones, ur: 80 mg/dL — AB
NITRITE: NEGATIVE
PH: 6 (ref 5.0–8.0)
Protein, ur: 100 mg/dL — AB
SPECIFIC GRAVITY, URINE: 1.019 (ref 1.005–1.030)
WBC, UA: >50 WBC/hpf — ABNORMAL HIGH (ref 0–5)

## 2018-11-20 LAB — CBC WITH DIFFERENTIAL/PLATELET
BASOS ABS: 0 10*3/uL (ref 0.0–0.1)
Basophils Relative: 0 %
Eosinophils Absolute: 0.1 10*3/uL (ref 0.0–0.5)
Eosinophils Relative: 1 %
HCT: 38 % (ref 36.0–46.0)
Hemoglobin: 13.3 g/dL (ref 12.0–15.0)
LYMPHS ABS: 1.5 10*3/uL (ref 0.7–4.0)
LYMPHS PCT: 11 %
MCH: 30 pg (ref 26.0–34.0)
MCHC: 35 g/dL (ref 30.0–36.0)
MCV: 85.8 fL (ref 80.0–100.0)
MONO ABS: 1.2 10*3/uL — AB (ref 0.1–1.0)
Monocytes Relative: 9 %
NRBC: 0 % (ref 0.0–0.2)
Neutro Abs: 10.6 10*3/uL — ABNORMAL HIGH (ref 1.7–7.7)
Neutrophils Relative %: 79 %
PLATELETS: 223 10*3/uL (ref 150–400)
RBC: 4.43 MIL/uL (ref 3.87–5.11)
RDW: 12.5 % (ref 11.5–15.5)
WBC: 13.4 10*3/uL — ABNORMAL HIGH (ref 4.0–10.5)

## 2018-11-20 LAB — INFLUENZA PANEL BY PCR (TYPE A & B)
Influenza A By PCR: NEGATIVE
Influenza B By PCR: NEGATIVE

## 2018-11-20 MED ORDER — PREDNISONE 20 MG PO TABS: 40.0000 mg | ORAL_TABLET | Freq: Every day | ORAL | 0 refills | Status: AC

## 2018-11-20 MED ORDER — PROMETHAZINE HCL 25 MG/ML IJ SOLN
25.0000 mg | Freq: Once | INTRAMUSCULAR | Status: AC
  Administered 2018-11-20: 25 mg via INTRAVENOUS
  Filled 2018-11-20: qty 1

## 2018-11-20 MED ORDER — IPRATROPIUM-ALBUTEROL 0.5-2.5 (3) MG/3ML IN SOLN
3.0000 mL | Freq: Once | RESPIRATORY_TRACT | Status: AC
  Administered 2018-11-20: 3 mL via RESPIRATORY_TRACT
  Filled 2018-11-20: qty 3

## 2018-11-20 MED ORDER — DEXTROSE 5 % IN LACTATED RINGERS IV BOLUS
1000.0000 mL | Freq: Once | INTRAVENOUS | Status: AC
  Administered 2018-11-20: 1000 mL via INTRAVENOUS

## 2018-11-20 MED ORDER — ONDANSETRON 4 MG PO TBDP: 4.0000 mg | ORAL_TABLET | Freq: Three times a day (TID) | ORAL | 0 refills | Status: DC | PRN

## 2018-11-20 MED ORDER — ACETAMINOPHEN 160 MG/5ML PO SOLN
650.0000 mg | Freq: Once | ORAL | Status: AC
  Administered 2018-11-20: 650 mg via ORAL
  Filled 2018-11-20: qty 20.3

## 2018-11-20 NOTE — Discharge Instructions (Signed)
Safe Medications in Pregnancy   Acne: Benzoyl Peroxide Salicylic Acid  Backache/Headache: Tylenol: 2 regular strength every 4 hours OR              2 Extra strength every 6 hours  Colds/Coughs/Allergies: Benadryl (alcohol free) 25 mg every 6 hours as needed Breath right strips Claritin Cepacol throat lozenges Chloraseptic throat spray Cold-Eeze- up to three times per day Cough drops, alcohol free Flonase (by prescription only) Guaifenesin Mucinex Robitussin DM (plain only, alcohol free) Saline nasal spray/drops Sudafed (pseudoephedrine) & Actifed ** use only after [redacted] weeks gestation and if you do not have high blood pressure Tylenol Vicks Vaporub Zinc lozenges Zyrtec   Constipation: Colace Ducolax suppositories Fleet enema Glycerin suppositories Metamucil Milk of magnesia Miralax Senokot Smooth move tea  Diarrhea: Kaopectate Imodium A-D  *NO pepto Bismol  Hemorrhoids: Anusol Anusol HC Preparation H Tucks  Indigestion: Tums Maalox Mylanta Zantac  Pepcid  Insomnia: Benadryl (alcohol free) 25mg  every 6 hours as needed Tylenol PM Unisom, no Gelcaps  Leg Cramps: Tums MagGel  Nausea/Vomiting:  Bonine Dramamine Emetrol Ginger extract Sea bands Meclizine  Nausea medication to take during pregnancy:  Unisom (doxylamine succinate 25 mg tablets) Take one tablet daily at bedtime. If symptoms are not adequately controlled, the dose can be increased to a maximum recommended dose of two tablets daily (1/2 tablet in the morning, 1/2 tablet mid-afternoon and one at bedtime). Vitamin B6 100mg  tablets. Take one tablet twice a day (up to 200 mg per day).  Skin Rashes: Aveeno products Benadryl cream or 25mg  every 6 hours as needed Calamine Lotion 1% cortisone cream  Yeast infection: Gyne-lotrimin 7 Monistat 7  Gum/tooth pain: Anbesol  **If taking multiple medications, please check labels to avoid duplicating the same active ingredients **take  medication as directed on the label ** Do not exceed 4000 mg of tylenol in 24 hours **Do not take medications that contain aspirin or ibuprofen       Upper Respiratory Infection, Adult An upper respiratory infection (URI) is a common viral infection of the nose, throat, and upper air passages that lead to the lungs. The most common type of URI is the common cold. URIs usually get better on their own, without medical treatment. What are the causes? A URI is caused by a virus. You may catch a virus by:  Breathing in droplets from an infected person's cough or sneeze.  Touching something that has been exposed to the virus (contaminated) and then touching your mouth, nose, or eyes. What increases the risk? You are more likely to get a URI if:  You are very young or very old.  It is autumn or winter.  You have close contact with others, such as at a daycare, school, or health care facility.  You smoke.  You have long-term (chronic) heart or lung disease.  You have a weakened disease-fighting (immune) system.  You have nasal allergies or asthma.  You are experiencing a lot of stress.  You work in an area that has poor air circulation.  You have poor nutrition. What are the signs or symptoms? A URI usually involves some of the following symptoms:  Runny or stuffy (congested) nose.  Sneezing.  Cough.  Sore throat.  Headache.  Fatigue.  Fever.  Loss of appetite.  Pain in your forehead, behind your eyes, and over your cheekbones (sinus pain).  Muscle aches.  Redness or irritation of the eyes.  Pressure in the ears or face. How is this diagnosed? This  condition may be diagnosed based on your medical history and symptoms, and a physical exam. Your health care provider may use a cotton swab to take a mucus sample from your nose (nasal swab). This sample can be tested to determine what virus is causing the illness. How is this treated? URIs usually get better on  their own within 7-10 days. You can take steps at home to relieve your symptoms. Medicines cannot cure URIs, but your health care provider may recommend certain medicines to help relieve symptoms, such as:  Over-the-counter cold medicines.  Cough suppressants. Coughing is a type of defense against infection that helps to clear the respiratory system, so take these medicines only as recommended by your health care provider.  Fever-reducing medicines. Follow these instructions at home: Activity  Rest as needed.  If you have a fever, stay home from work or school until your fever is gone or until your health care provider says you are no longer contagious. Your health care provider may have you wear a face mask to prevent your infection from spreading. Relieving symptoms  Gargle with a salt-water mixture 3-4 times a day or as needed. To make a salt-water mixture, completely dissolve -1 tsp of salt in 1 cup of warm water.  Use a cool-mist humidifier to add moisture to the air. This can help you breathe more easily. Eating and drinking   Drink enough fluid to keep your urine pale yellow.  Eat soups and other clear broths. General instructions   Take over-the-counter and prescription medicines only as told by your health care provider. These include cold medicines, fever reducers, and cough suppressants.  Do not use any products that contain nicotine or tobacco, such as cigarettes and e-cigarettes. If you need help quitting, ask your health care provider.  Stay away from secondhand smoke.  Stay up to date on all immunizations, including the yearly (annual) flu vaccine.  Keep all follow-up visits as told by your health care provider. This is important. How to prevent the spread of infection to others   URIs can be passed from person to person (are contagious). To prevent the infection from spreading: ? Wash your hands often with soap and water. If soap and water are not available,  use hand sanitizer. ? Avoid touching your mouth, face, eyes, or nose. ? Cough or sneeze into a tissue or your sleeve or elbow instead of into your hand or into the air. Contact a health care provider if:  You are getting worse instead of better.  You have a fever or chills.  Your mucus is brown or red.  You have yellow or brown discharge coming from your nose.  You have pain in your face, especially when you bend forward.  You have swollen neck glands.  You have pain while swallowing.  You have white areas in the back of your throat. Get help right away if:  You have shortness of breath that gets worse.  You have severe or persistent: ? Headache. ? Ear pain. ? Sinus pain. ? Chest pain.  You have chronic lung disease along with any of the following: ? Wheezing. ? Prolonged cough. ? Coughing up blood. ? A change in your usual mucus.  You have a stiff neck.  You have changes in your: ? Vision. ? Hearing. ? Thinking. ? Mood. Summary  An upper respiratory infection (URI) is a common infection of the nose, throat, and upper air passages that lead to the lungs.  A URI is  caused by a virus.  URIs usually get better on their own within 7-10 days.  Medicines cannot cure URIs, but your health care provider may recommend certain medicines to help relieve symptoms. This information is not intended to replace advice given to you by your health care provider. Make sure you discuss any questions you have with your health care provider. Document Released: 05/06/2001 Document Revised: 06/26/2017 Document Reviewed: 06/26/2017 Elsevier Interactive Patient Education  2019 [employer].

## 2018-11-20 NOTE — MAU Note (Signed)
Alyssa [name] is a 22 y.o. at 4059w3d here in MAU reporting:  +lower back pain. Sharp and pressure. Constant. Onset of complaint: last night Pain score: 8/10. Has not taken any medication. +feels hot but has not taken her temperature +N/V. States unable to keep anything down Reports that her asthma has flared up. Green mucus. Vitals:   11/20/18 1134  BP: 100/63  Pulse: (!) 122  Resp: 16  Temp: 98.8 F (37.1 C)  SpO2: 99%    Lab orders placed from triage: ua

## 2018-11-20 NOTE — MAU Provider Note (Signed)
Chief Complaint: Back Pain and Emesis   First Provider Initiated Contact with Patient 11/20/18 1201     SUBJECTIVE HPI: Alyssa [name] is a 22 y.o. G1P0000 at [redacted]w[redacted]d who presents to [facility] reporting back pain, SOB, wheezing. Hx of asthma. Uses albuterol inhaler & neb PRN but normally only needs it in the winter time. Reports using it numerous times this week d/t symptoms that started on Christmas day.  Reports wheezing, productive cough, chills, back pain, and nausea/vomiting. Didn't check temp at home. Has not been taking anything for her symptoms. Denies sore throat or ear pain. No sick contacts.  Back pain is worse in left lower area and left flank. Pain is constant. Touch and certain positions make pain worse. Has had nausea with pregnancy but n/v hasn't worsened in the last 3 days. Denies dysuria, hematuria, or frequency.  Denies abdominal pain, vaginal bleeding, or vaginal discharge. Has not started prenatal care yet.   Location: back Quality: sharp & pressure Severity: 8/10 on pain scale Duration: 2 days Timing: constant Modifying factors: worse with touch and movement. Hasn't treated symptoms Associated signs and symptoms: cough, wheezing, n/v, chills  Past Medical History:  Diagnosis Date  . ADHD (attention deficit hyperactivity disorder)   . Angio-edema   . Asthma   . Eczema   . Recurrent upper respiratory infection (URI)   . Urticaria    OB History  Gravida Para Term Preterm AB Living  1 0 0 0 0 0  SAB TAB Ectopic Multiple Live Births  0 0 0 0 0    # Outcome Date GA Lbr Len/2nd Weight Sex Delivery Anes PTL Lv  1 Current            Past Surgical History:  Procedure Laterality Date  . WISDOM TOOTH EXTRACTION     Social History   Socioeconomic History  . Marital status: Single    Spouse name: Not on file  . Number of children: Not on file  . Years of education: Not on file  . Highest education level: Not on file  Occupational History  .  Not on file  Social Needs  . Financial resource strain: Not on file  . Food insecurity:    Worry: Not on file    Inability: Not on file  . Transportation needs:    Medical: Not on file    Non-medical: Not on file  Tobacco Use  . Smoking status: Never Smoker  . Smokeless tobacco: Never Used  Substance and Sexual Activity  . Alcohol use: No  . Drug use: Yes    Types: Marijuana    Comment: last used 09-26-18  . Sexual activity: Yes    Birth control/protection: None    Comment: pregnant  Lifestyle  . Physical activity:    Days per week: Not on file    Minutes per session: Not on file  . Stress: Not on file  Relationships  . Social connections:    Talks on phone: Not on file    Gets together: Not on file    Attends religious service: Not on file    Active member of club or organization: Not on file    Attends meetings of clubs or organizations: Not on file    Relationship status: Not on file  . Intimate partner violence:    Fear of current or ex partner: Not on file    Emotionally abused: Not on file    Physically abused: Not on file    Forced  sexual activity: Not on file  Other Topics Concern  . Not on file  Social History Narrative  . Not on file   Family History  Problem Relation Age of Onset  . Asthma Mother   . Eczema Mother   . Allergic rhinitis Mother   . Angioedema Mother   . Asthma Brother   . Asthma Brother   . Allergic rhinitis Brother   . Urticaria Neg Hx   . Immunodeficiency Neg Hx    No current facility-administered medications on file prior to encounter.    Current Outpatient Medications on File Prior to Encounter  Medication Sig Dispense Refill  . albuterol (PROAIR HFA) 108 (90 Base) MCG/ACT inhaler TWO PUFFS EVERY 4-6 HOURS AS NEEDED FOR COUGH OR WHEEZE. (Patient not taking: Reported on 10/25/2017) 1 Inhaler 1  . EPINEPHrine 0.3 mg/0.3 mL IJ SOAJ injection Inject 0.3 mLs (0.3 mg total) into the muscle once. 2 Device 2  . fluticasone (FLONASE) 50  MCG/ACT nasal spray Place 2 sprays into both nostrils daily. (Patient not taking: Reported on 10/25/2017) 16 g 0  . glycopyrrolate (ROBINUL) 1 MG tablet Take 1 tablet (1 mg total) by mouth 3 (three) times daily. For spitting 30 tablet 1  . promethazine (PHENERGAN) 25 MG tablet Take 0.5-1 tablets (12.5-25 mg total) by mouth every 6 (six) hours as needed for nausea or vomiting. 30 tablet 1  . scopolamine (TRANSDERM-SCOP) 1 MG/3DAYS Place 1 patch (1.5 mg total) onto the skin every 3 (three) days. 10 patch 12   Allergies  Allergen Reactions  . Shellfish Allergy Hives    "Throat closes"  . Banana Itching and Swelling  . Peach Flavor Itching    I have reviewed patient's Past Medical Hx, Surgical Hx, Family Hx, Social Hx, medications and allergies.   Review of Systems  Constitutional: Positive for chills. Negative for fever (didn't check temp at home).  HENT: Negative for congestion, ear pain and sore throat.   Respiratory: Positive for cough, chest tightness and wheezing.   Cardiovascular: Negative for chest pain.  Gastrointestinal: Positive for nausea and vomiting. Negative for abdominal pain, constipation and diarrhea.  Genitourinary: Positive for flank pain. Negative for dysuria, frequency and vaginal bleeding.  Musculoskeletal: Positive for back pain.    OBJECTIVE Patient Vitals for the past 24 hrs:  BP Temp Temp src Pulse Resp SpO2 Weight  11/20/18 1437 (!) 92/51 - - 90 - - -  11/20/18 1403 - - - (!) 103 - - -  11/20/18 1332 - 98.6 F (37 C) - - - - -  11/20/18 1320 - - - (!) 127 - 91 % -  11/20/18 1310 - - - (!) 104 - 100 % -  11/20/18 1245 - - - (!) 111 - 96 % -  11/20/18 1210 - - - (!) 104 - 99 % -  11/20/18 1134 100/63 98.8 F (37.1 C) Oral (!) 122 16 99 % 70.8 kg   Constitutional: Well-developed, well-nourished female in no acute distress.  Cardiovascular: tachycardia. Normal rhythm, no murmur Respiratory: Wheezing throughout, worse in RUL. normal rate and effort GI: Left  CVAT. Abd soft, non-tender, Pos BS x 4. No guarding or rebound tenderness MS: Extremities nontender, no edema, normal ROM Neurologic: Alert and oriented x 4.     LAB RESULTS Results for orders placed or performed during the hospital encounter of 11/20/18 (from the past 24 hour(s))  Urinalysis, Routine w reflex microscopic     Status: Abnormal   Collection Time: 11/20/18 11:56  AM  Result Value Ref Range   Color, Urine YELLOW YELLOW   APPearance CLOUDY (A) CLEAR   Specific Gravity, Urine 1.019 1.005 - 1.030   pH 6.0 5.0 - 8.0   Glucose, UA NEGATIVE NEGATIVE mg/dL   Hgb urine dipstick NEGATIVE NEGATIVE   Bilirubin Urine NEGATIVE NEGATIVE   Ketones, ur 80 (A) NEGATIVE mg/dL   Protein, ur 161100 (A) NEGATIVE mg/dL   Nitrite NEGATIVE NEGATIVE   Leukocytes, UA LARGE (A) NEGATIVE   RBC / HPF 11-20 0 - 5 RBC/hpf   WBC, UA >50 (H) 0 - 5 WBC/hpf   Bacteria, UA FEW (A) NONE SEEN   Squamous Epithelial / LPF 21-50 0 - 5   WBC Clumps PRESENT    Mucus PRESENT   CBC with Differential/Platelet     Status: Abnormal   Collection Time: 11/20/18 12:42 PM  Result Value Ref Range   WBC 13.4 (H) 4.0 - 10.5 K/uL   RBC 4.43 3.87 - 5.11 MIL/uL   Hemoglobin 13.3 12.0 - 15.0 g/dL   HCT 09.638.0 04.536.0 - 40.946.0 %   MCV 85.8 80.0 - 100.0 fL   MCH 30.0 26.0 - 34.0 pg   MCHC 35.0 30.0 - 36.0 g/dL   RDW 81.112.5 91.411.5 - 78.215.5 %   Platelets 223 150 - 400 K/uL   nRBC 0.0 0.0 - 0.2 %   Neutrophils Relative % 79 %   Neutro Abs 10.6 (H) 1.7 - 7.7 K/uL   Lymphocytes Relative 11 %   Lymphs Abs 1.5 0.7 - 4.0 K/uL   Monocytes Relative 9 %   Monocytes Absolute 1.2 (H) 0.1 - 1.0 K/uL   Eosinophils Relative 1 %   Eosinophils Absolute 0.1 0.0 - 0.5 K/uL   Basophils Relative 0 %   Basophils Absolute 0.0 0.0 - 0.1 K/uL  Influenza panel by PCR (type A & B)     Status: None   Collection Time: 11/20/18 12:42 PM  Result Value Ref Range   Influenza A By PCR NEGATIVE NEGATIVE   Influenza B By PCR NEGATIVE NEGATIVE  Comprehensive  metabolic panel     Status: Abnormal   Collection Time: 11/20/18 12:42 PM  Result Value Ref Range   Sodium 131 (L) 135 - 145 mmol/L   Potassium 3.3 (L) 3.5 - 5.1 mmol/L   Chloride 100 98 - 111 mmol/L   CO2 21 (L) 22 - 32 mmol/L   Glucose, Bld 76 70 - 99 mg/dL   BUN 7 6 - 20 mg/dL   Creatinine, Ser 9.560.55 0.44 - 1.00 mg/dL   Calcium 8.7 (L) 8.9 - 10.3 mg/dL   Total Protein 7.5 6.5 - 8.1 g/dL   Albumin 3.6 3.5 - 5.0 g/dL   AST 16 15 - 41 U/L   ALT 13 0 - 44 U/L   Alkaline Phosphatase 53 38 - 126 U/L   Total Bilirubin 0.6 0.3 - 1.2 mg/dL   GFR calc non Af Amer >60 >60 mL/min   GFR calc Af Amer >60 >60 mL/min   Anion gap 10 5 - 15    IMAGING No results found.  MAU COURSE Orders Placed This Encounter  Procedures  . Culture, OB Urine  . Urinalysis, Routine w reflex microscopic  . CBC with Differential/Platelet  . Influenza panel by PCR (type A & B)  . Comprehensive metabolic panel  . Discharge patient   Meds ordered this encounter  Medications  . ipratropium-albuterol (DUONEB) 0.5-2.5 (3) MG/3ML nebulizer solution 3 mL  . dextrose  5% lactated ringers bolus 1,000 mL  . promethazine (PHENERGAN) injection 25 mg  . acetaminophen (TYLENOL) solution 650 mg  . ondansetron (ZOFRAN ODT) 4 MG disintegrating tablet    Sig: Take 1 tablet (4 mg total) by mouth every 8 (eight) hours as needed for nausea or vomiting.    Dispense:  15 tablet    Refill:  0    Order Specific Question:   Supervising Provider    Answer:   Adam PhenixARNOLD, JAMES G [[id number]]  . predniSONE (DELTASONE) 20 MG tablet    Sig: Take 2 tablets (40 mg total) by mouth daily with breakfast for 5 days.    Dispense:  10 tablet    Refill:  0    Order Specific Question:   Supervising Provider    Answer:   Adam PhenixARNOLD, JAMES G [[id number]]    MDM FHT present via doppler Pt afebrile during MAU stay. Some tachycardia which may be related to albuterol use.  Elevated WBC, likely pregnancy related as pt is afebrile.  Mild left CVAT, but no urinary  complaints or obvious signs of infection. Will culture urine.  Wheezing & chest tightness improved after duoneb tx. Flu swab negative Rx steroid burst. Discussed reasons to return to MAU. Urine culture pending.   ASSESSMENT 1. Mild intermittent asthma with acute exacerbation   2. Upper respiratory infection, acute   3. [redacted] weeks gestation of pregnancy     PLAN Discharge home in stable condition.   Allergies as of 11/20/2018      Reactions   Shellfish Allergy Hives   "Throat closes"   Banana Itching, Swelling   Peach Flavor Itching      Medication List    STOP taking these medications   potassium chloride 20 MEQ packet Commonly known as:  KLOR-CON     TAKE these medications   albuterol 108 (90 Base) MCG/ACT inhaler Commonly known as:  PROAIR HFA TWO PUFFS EVERY 4-6 HOURS AS NEEDED FOR COUGH OR WHEEZE.   EPINEPHrine 0.3 mg/0.3 mL Soaj injection Commonly known as:  EPI-PEN Inject 0.3 mLs (0.3 mg total) into the muscle once.   fluticasone 50 MCG/ACT nasal spray Commonly known as:  FLONASE Place 2 sprays into both nostrils daily.   glycopyrrolate 1 MG tablet Commonly known as:  ROBINUL Take 1 tablet (1 mg total) by mouth 3 (three) times daily. For spitting   ondansetron 4 MG disintegrating tablet Commonly known as:  ZOFRAN ODT Take 1 tablet (4 mg total) by mouth every 8 (eight) hours as needed for nausea or vomiting.   predniSONE 20 MG tablet Commonly known as:  DELTASONE Take 2 tablets (40 mg total) by mouth daily with breakfast for 5 days.   promethazine 25 MG tablet Commonly known as:  PHENERGAN Take 0.5-1 tablets (12.5-25 mg total) by mouth every 6 (six) hours as needed for nausea or vomiting.   scopolamine 1 MG/3DAYS Commonly known as:  TRANSDERM-SCOP Place 1 patch (1.5 mg total) onto the skin every 3 (three) days.        Judeth HornLawrence, Shavette Shoaff, NP 11/20/2018  5:52 PM

## 2018-11-21 LAB — CULTURE, OB URINE

## 2018-11-22 ENCOUNTER — Telehealth: Payer: Self-pay | Admitting: Obstetrics and Gynecology

## 2018-11-22 NOTE — Telephone Encounter (Signed)
Ms. Deretha EmoryChambers was seen in MAU on 12/28 due to asthma. She had a urine culture that shows >100,000 of multiple species. No sensibilities were resulted. Attempted to call patient to discuss and assess patient's comfort/symptoms. No answer. Message left to return call to [phone].    Duane Lopeasch, Mileah Hemmer I, NP 11/22/2018 7:38 PM

## 2018-11-24 NOTE — L&D Delivery Note (Signed)
Delivery Note Pt progressed to complete at 2340; was pushing involuntarily as was unable to have epidural placed. At 11:45 PM a viable female was delivered via Vaginal, Spontaneous (Presentation: direct OA).  APGAR: 8, 9; weight 2540gm (5lb 9.6oz).  Infant dried and placed on pt's abd; cord clamped and cut by FOB. Hospital cord blood sample collected. Placenta status: spont, intact.  Cord: 3 vessel  Anesthesia:  None Episiotomy: None Lacerations:  None Est. Blood Loss (mL): 224  Mom to [facility] specialty for mag sulfate x 24hr PP.  Baby to Couplet care / Skin to Skin.  Myrtis Ser CNM 04/30/2019, 12:20 AM  Please schedule this patient for Postpartum visit in: 1 week for BP, then 4wk PP visit with the following provider: Any provider For C/S patients schedule nurse incision check in weeks 2 weeks: no High risk pregnancy complicated by: severe pre-e Delivery mode:  SVD Anticipated Birth Control:  other/unsure PP Procedures needed: BP check in 1 wk Schedule Integrated [place] visit: yes (offer due to hx of depression)

## 2018-11-25 ENCOUNTER — Encounter (HOSPITAL_COMMUNITY): Payer: Self-pay

## 2018-11-25 ENCOUNTER — Inpatient Hospital Stay (HOSPITAL_COMMUNITY)
Admission: AD | Admit: 2018-11-25 | Discharge: 2018-11-25 | Disposition: A | Discharge status: Home / Self Care | Payer: Medicaid Other | Attending: Obstetrics & Gynecology | Admitting: Obstetrics & Gynecology

## 2018-11-25 ENCOUNTER — Inpatient Hospital Stay (HOSPITAL_COMMUNITY): Payer: Medicaid Other

## 2018-11-25 DIAGNOSIS — O219 Vomiting of pregnancy, unspecified: Secondary | ICD-10-CM

## 2018-11-25 DIAGNOSIS — J4520 Mild intermittent asthma, uncomplicated: Secondary | ICD-10-CM

## 2018-11-25 DIAGNOSIS — J069 Acute upper respiratory infection, unspecified: Secondary | ICD-10-CM

## 2018-11-25 DIAGNOSIS — O26892 Other specified pregnancy related conditions, second trimester: Secondary | ICD-10-CM | POA: Diagnosis not present

## 2018-11-25 DIAGNOSIS — O99512 Diseases of the respiratory system complicating pregnancy, second trimester: Secondary | ICD-10-CM | POA: Insufficient documentation

## 2018-11-25 DIAGNOSIS — Z3A17 17 weeks gestation of pregnancy: Secondary | ICD-10-CM | POA: Diagnosis not present

## 2018-11-25 DIAGNOSIS — E876 Hypokalemia: Secondary | ICD-10-CM

## 2018-11-25 DIAGNOSIS — O210 Mild hyperemesis gravidarum: Secondary | ICD-10-CM | POA: Diagnosis present

## 2018-11-25 LAB — CBC
HEMATOCRIT: 35.1 % — AB (ref 36.0–46.0)
Hemoglobin: 12.6 g/dL (ref 12.0–15.0)
MCH: 29.8 pg (ref 26.0–34.0)
MCHC: 35.9 g/dL (ref 30.0–36.0)
MCV: 83 fL (ref 80.0–100.0)
Platelets: 198 10*3/uL (ref 150–400)
RBC: 4.23 MIL/uL (ref 3.87–5.11)
RDW: 12.1 % (ref 11.5–15.5)
WBC: 12.5 10*3/uL — AB (ref 4.0–10.5)
nRBC: 0 % (ref 0.0–0.2)

## 2018-11-25 LAB — URINALYSIS, ROUTINE W REFLEX MICROSCOPIC
Bilirubin Urine: NEGATIVE
Glucose, UA: NEGATIVE mg/dL
Ketones, ur: 80 mg/dL — AB
Nitrite: NEGATIVE
PH: 6 (ref 5.0–8.0)
Protein, ur: 30 mg/dL — AB
SPECIFIC GRAVITY, URINE: 1.017 (ref 1.005–1.030)

## 2018-11-25 LAB — COMPREHENSIVE METABOLIC PANEL
ALBUMIN: 2.9 g/dL — AB (ref 3.5–5.0)
ALK PHOS: 73 U/L (ref 38–126)
ALT: 25 U/L (ref 0–44)
AST: 26 U/L (ref 15–41)
Anion gap: 13 (ref 5–15)
BILIRUBIN TOTAL: 0.8 mg/dL (ref 0.3–1.2)
BUN: 7 mg/dL (ref 6–20)
CO2: 21 mmol/L — ABNORMAL LOW (ref 22–32)
Calcium: 8.6 mg/dL — ABNORMAL LOW (ref 8.9–10.3)
Chloride: 96 mmol/L — ABNORMAL LOW (ref 98–111)
Creatinine, Ser: 0.75 mg/dL (ref 0.44–1.00)
GFR calc Af Amer: >60 mL/min (ref >60)
GFR calc non Af Amer: >60 mL/min (ref >60)
GLUCOSE: 95 mg/dL (ref 70–99)
Potassium: 2.9 mmol/L — ABNORMAL LOW (ref 3.5–5.1)
Sodium: 130 mmol/L — ABNORMAL LOW (ref 135–145)
TOTAL PROTEIN: 7.9 g/dL (ref 6.5–8.1)

## 2018-11-25 MED ORDER — ONDANSETRON 8 MG PO TBDP
8.0000 mg | ORAL_TABLET | Freq: Once | ORAL | Status: AC
  Administered 2018-11-25: 8 mg via ORAL
  Filled 2018-11-25: qty 1

## 2018-11-25 MED ORDER — IPRATROPIUM-ALBUTEROL 0.5-2.5 (3) MG/3ML IN SOLN
3.0000 mL | Freq: Once | RESPIRATORY_TRACT | Status: AC
  Administered 2018-11-25: 3 mL via RESPIRATORY_TRACT
  Filled 2018-11-25: qty 3

## 2018-11-25 MED ORDER — POTASSIUM CHLORIDE 20 MEQ PO PACK: 20.0000 meq | PACK | Freq: Two times a day (BID) | ORAL | 0 refills | Status: DC

## 2018-11-25 MED ORDER — PROMETHAZINE HCL 25 MG PO TABS: 25.0000 mg | ORAL_TABLET | Freq: Four times a day (QID) | ORAL | 0 refills | Status: DC | PRN

## 2018-11-25 MED ORDER — ONDANSETRON 4 MG PO TBDP: 4.0000 mg | ORAL_TABLET | Freq: Three times a day (TID) | ORAL | 0 refills | Status: DC | PRN

## 2018-11-25 MED ORDER — ALBUTEROL SULFATE (2.5 MG/3ML) 0.083% IN NEBU: 2.5000 mg | INHALATION_SOLUTION | Freq: Four times a day (QID) | RESPIRATORY_TRACT | 12 refills | Status: DC | PRN

## 2018-11-25 NOTE — MAU Note (Signed)
Pt states she hasn't eaten since Christmas, tried some fruit but can't keep food down, just been drinking water.

## 2018-11-25 NOTE — MAU Provider Note (Signed)
Chief Complaint: Emesis and Cough   First Provider Initiated Contact with Patient 11/25/18 1151     SUBJECTIVE HPI: Alyssa [name] is a 23 y.o. G1P0000 at [id number] who presents [facility] reporting n/v, weakness, SOB, and cough. Hx of asthma. Was seen in MAU on Saturday for similar complaints. Has taken zofran occasionally, last dose was last night. Has vomited twice today. States she can't keep any food down but is able to tolerate water. Reports not keeping down solids since Christmas.  Wheezing and cough has worsened since Saturday. Has albuterol at home but states it doesn't help. Was prescribed oral steroids but never took them. Unsure if she's having fevers b/c she doesn't have a thermometer at home. States she feels like she has a lot of mucous in the left side of her chest which is causing left chest pressure. Not taking any meds for her symptoms.  Denies abdominal pain, diarrhea, vaginal bleeding, or dysuria.   Location: chest Quality: pressure Severity: 7/10 on pain scale Duration: 5 days Timing: intermittent Modifying factors: worse with coughing Associated signs and symptoms: cough, chills, n/v  Past Medical History:  Diagnosis Date  . ADHD (attention deficit hyperactivity disorder)   . Angio-edema   . Asthma   . Eczema   . Recurrent upper respiratory infection (URI)   . Urticaria    OB History  Gravida Para Term Preterm AB Living  1 0 0 0 0 0  SAB TAB Ectopic Multiple Live Births  0 0 0 0 0    # Outcome Date GA Lbr Len/2nd Weight Sex Delivery Anes PTL Lv  1 Current            Past Surgical History:  Procedure Laterality Date  . WISDOM TOOTH EXTRACTION     Social History   Socioeconomic History  . Marital status: Single    Spouse name: Not on file  . Number of children: Not on file  . Years of education: Not on file  . Highest education level: Not on file  Occupational History  . Not on file  Social Needs  . Financial resource strain:  Not on file  . Food insecurity:    Worry: Not on file    Inability: Not on file  . Transportation needs:    Medical: Not on file    Non-medical: Not on file  Tobacco Use  . Smoking status: Never Smoker  . Smokeless tobacco: Never Used  Substance and Sexual Activity  . Alcohol use: No  . Drug use: Yes    Types: Marijuana    Comment: last used 12/19  . Sexual activity: Yes    Birth control/protection: None    Comment: pregnant  Lifestyle  . Physical activity:    Days per week: Not on file    Minutes per session: Not on file  . Stress: Not on file  Relationships  . Social connections:    Talks on phone: Not on file    Gets together: Not on file    Attends religious service: Not on file    Active member of club or organization: Not on file    Attends meetings of clubs or organizations: Not on file    Relationship status: Not on file  . Intimate partner violence:    Fear of current or ex partner: Not on file    Emotionally abused: Not on file    Physically abused: Not on file    Forced sexual activity: Not on file  Other Topics Concern  . Not on file  Social History Narrative  . Not on file   Family History  Problem Relation Age of Onset  . Asthma Mother   . Eczema Mother   . Allergic rhinitis Mother   . Angioedema Mother   . Asthma Brother   . Asthma Brother   . Allergic rhinitis Brother   . Urticaria Neg Hx   . Immunodeficiency Neg Hx    No current facility-administered medications on file prior to encounter.    Current Outpatient Medications on File Prior to Encounter  Medication Sig Dispense Refill  . albuterol (PROAIR HFA) 108 (90 Base) MCG/ACT inhaler TWO PUFFS EVERY 4-6 HOURS AS NEEDED FOR COUGH OR WHEEZE. (Patient not taking: Reported on 10/25/2017) 1 Inhaler 1  . EPINEPHrine 0.3 mg/0.3 mL IJ SOAJ injection Inject 0.3 mLs (0.3 mg total) into the muscle once. 2 Device 2  . glycopyrrolate (ROBINUL) 1 MG tablet Take 1 tablet (1 mg total) by mouth 3 (three)  times daily. For spitting 30 tablet 1  . predniSONE (DELTASONE) 20 MG tablet Take 2 tablets (40 mg total) by mouth daily with breakfast for 5 days. 10 tablet 0   Allergies  Allergen Reactions  . Shellfish Allergy Hives    "Throat closes"  . Banana Itching and Swelling  . Peach Flavor Itching    I have reviewed patient's Past Medical Hx, Surgical Hx, Family Hx, Social Hx, medications and allergies.   Review of Systems  Constitutional: Positive for chills. Negative for fever.  Respiratory: Positive for cough, chest tightness, shortness of breath and wheezing.   Gastrointestinal: Positive for nausea and vomiting. Negative for abdominal pain, constipation and diarrhea.  Genitourinary: Negative.   Neurological: Negative for dizziness and headaches.    OBJECTIVE Patient Vitals for the past 24 hrs:  BP Temp Temp src Pulse Resp SpO2 Weight  11/25/18 1318 120/68 - - (!) 112 18 - -  11/25/18 1305 - - - - - 100 % -  11/25/18 1259 - - - - - 100 % -  11/25/18 1237 - - - - - - 69.7 kg  11/25/18 1235 - - - - - 100 % -  11/25/18 1230 - - - - - 98 % -  11/25/18 1225 - - - - - 100 % -  11/25/18 1215 - - - - - 97 % -  11/25/18 1210 - - - - - 100 % -  11/25/18 1120 123/79 99.4 F (37.4 C) Oral (!) 122 (!) 26 100 % -  11/25/18 1119 - - - - - 100 % -   Constitutional: Well-developed, well-nourished female in no acute distress.  Cardiovascular: normal rate & rhythm, no murmur Respiratory: normal rate and effort. Wheezing in LUL GI: Abd soft, non-tender, Pos BS x 4. No guarding or rebound tenderness MS: Extremities nontender, no edema, normal ROM Neurologic: Alert and oriented x 4.     LAB RESULTS Results for orders placed or performed during the hospital encounter of 11/25/18 (from the past 24 hour(s))  Urinalysis, Routine w reflex microscopic     Status: Abnormal   Collection Time: 11/25/18 10:58 AM  Result Value Ref Range   Color, Urine YELLOW YELLOW   APPearance CLOUDY (A) CLEAR    Specific Gravity, Urine 1.017 1.005 - 1.030   pH 6.0 5.0 - 8.0   Glucose, UA NEGATIVE NEGATIVE mg/dL   Hgb urine dipstick SMALL (A) NEGATIVE   Bilirubin Urine NEGATIVE NEGATIVE   Ketones, ur  80 (A) NEGATIVE mg/dL   Protein, ur 30 (A) NEGATIVE mg/dL   Nitrite NEGATIVE NEGATIVE   Leukocytes, UA MODERATE (A) NEGATIVE   RBC / HPF 6-10 0 - 5 RBC/hpf   WBC, UA 21-50 0 - 5 WBC/hpf   Bacteria, UA MANY (A) NONE SEEN   Squamous Epithelial / LPF 21-50 0 - 5   Mucus PRESENT   CBC     Status: Abnormal   Collection Time: 11/25/18 12:33 PM  Result Value Ref Range   WBC 12.5 (H) 4.0 - 10.5 K/uL   RBC 4.23 3.87 - 5.11 MIL/uL   Hemoglobin 12.6 12.0 - 15.0 g/dL   HCT 91.435.1 (L) 78.236.0 - 95.646.0 %   MCV 83.0 80.0 - 100.0 fL   MCH 29.8 26.0 - 34.0 pg   MCHC 35.9 30.0 - 36.0 g/dL   RDW 21.312.1 08.611.5 - 57.815.5 %   Platelets 198 150 - 400 K/uL   nRBC 0.0 0.0 - 0.2 %  Comprehensive metabolic panel     Status: Abnormal   Collection Time: 11/25/18 12:33 PM  Result Value Ref Range   Sodium 130 (L) 135 - 145 mmol/L   Potassium 2.9 (L) 3.5 - 5.1 mmol/L   Chloride 96 (L) 98 - 111 mmol/L   CO2 21 (L) 22 - 32 mmol/L   Glucose, Bld 95 70 - 99 mg/dL   BUN 7 6 - 20 mg/dL   Creatinine, Ser 4.690.75 0.44 - 1.00 mg/dL   Calcium 8.6 (L) 8.9 - 10.3 mg/dL   Total Protein 7.9 6.5 - 8.1 g/dL   Albumin 2.9 (L) 3.5 - 5.0 g/dL   AST 26 15 - 41 U/L   ALT 25 0 - 44 U/L   Alkaline Phosphatase 73 38 - 126 U/L   Total Bilirubin 0.8 0.3 - 1.2 mg/dL   GFR calc non Af Amer >60 >60 mL/min   GFR calc Af Amer >60 >60 mL/min   Anion gap 13 5 - 15    IMAGING Dg Chest 2 View  Result Date: 11/25/2018 CLINICAL DATA:  Shortness of breath, mid upper chest heaviness EXAM: CHEST - 2 VIEW COMPARISON:  04/15/2017 FINDINGS: Heart and mediastinal contours are within normal limits. No focal opacities or effusions. No acute bony abnormality. IMPRESSION: No active cardiopulmonary disease. Electronically Signed   By: Charlett NoseKevin  Dover M.D.   On: 11/25/2018 12:53     MAU COURSE Orders Placed This Encounter  Procedures  . DG Chest 2 View  . Urinalysis, Routine w reflex microscopic  . CBC  . Comprehensive metabolic panel  . Discharge patient   Meds ordered this encounter  Medications  . ipratropium-albuterol (DUONEB) 0.5-2.5 (3) MG/3ML nebulizer solution 3 mL  . ondansetron (ZOFRAN-ODT) disintegrating tablet 8 mg  . ondansetron (ZOFRAN ODT) 4 MG disintegrating tablet    Sig: Take 1 tablet (4 mg total) by mouth every 8 (eight) hours as needed for nausea or vomiting.    Dispense:  20 tablet    Refill:  0    Order Specific Question:   Supervising Provider    Answer:   Jaynie CollinsANYANWU, UGONNA A [[id number]]  . potassium chloride (KLOR-CON) 20 MEQ packet    Sig: Take 20 mEq by mouth 2 (two) times daily for 3 days.    Dispense:  6 packet    Refill:  0    Order Specific Question:   Supervising Provider    Answer:   Jaynie CollinsANYANWU, UGONNA A [[id number]]  . albuterol (PROVENTIL) (2.5 MG/3ML) 0.083% nebulizer  solution    Sig: Take 3 mLs (2.5 mg total) by nebulization every 6 (six) hours as needed for wheezing or shortness of breath.    Dispense:  75 mL    Refill:  12    Order Specific Question:   Supervising Provider    Answer:   Jaynie Collins A [[id number]]  . promethazine (PHENERGAN) 25 MG tablet    Sig: Take 1 tablet (25 mg total) by mouth every 6 (six) hours as needed for nausea or vomiting.    Dispense:  30 tablet    Refill:  0    Order Specific Question:   Supervising Provider    Answer:   Jaynie Collins A [[id number]]    MDM FHT present via doppler Duoneb tx -- pt reports improvement in symptoms Tachycardia & tachypnea in presence of cough, chills --- chest xray ordered & is negative.  CBC -- WBC down from previous visit Potassium down from last visit.   Pt declines IV fluids & able to tolerate PO challenge. Discussed meds at home. Will take zofran & phenergan on schedule. Take nausea meds prior to taking prednisone. Prescribed Klor-con powder x3 days.  Pt requesting  refill for albuterol nebulizer  Discussed OTC meds for symptoms   ASSESSMENT 1. Mild intermittent asthma without complication   2. Upper respiratory tract infection, unspecified type   3. Hypokalemia   4. Nausea and vomiting during pregnancy prior to [redacted] weeks gestation   5. [redacted] weeks gestation of pregnancy     PLAN Discharge home in stable condition. Keep first scheduled appt with [facility] next week  Follow-up Information    [facility] Follow up.   Contact information: [address]  [address] [place] [address] [phone]          Allergies as of 11/25/2018      Reactions   Shellfish Allergy Hives   "Throat closes"   Banana Itching, Swelling   Peach Flavor Itching      Medication List    STOP taking these medications   fluticasone 50 MCG/ACT nasal spray Commonly known as:  FLONASE   scopolamine 1 MG/3DAYS Commonly known as:  TRANSDERM-SCOP     TAKE these medications   albuterol 108 (90 Base) MCG/ACT inhaler Commonly known as:  PROAIR HFA TWO PUFFS EVERY 4-6 HOURS AS NEEDED FOR COUGH OR WHEEZE. What changed:  Another medication with the same name was added. Make sure you understand how and when to take each.   albuterol (2.5 MG/3ML) 0.083% nebulizer solution Commonly known as:  PROVENTIL Take 3 mLs (2.5 mg total) by nebulization every 6 (six) hours as needed for wheezing or shortness of breath. What changed:  You were already taking a medication with the same name, and this prescription was added. Make sure you understand how and when to take each.   EPINEPHrine 0.3 mg/0.3 mL Soaj injection Commonly known as:  EPI-PEN Inject 0.3 mLs (0.3 mg total) into the muscle once.   glycopyrrolate 1 MG tablet Commonly known as:  ROBINUL Take 1 tablet (1 mg total) by mouth 3 (three) times daily. For spitting   ondansetron 4 MG disintegrating tablet Commonly known as:  ZOFRAN ODT Take 1 tablet (4 mg total) by mouth every 8 (eight)  hours as needed for nausea or vomiting.   potassium chloride 20 MEQ packet Commonly known as:  KLOR-CON Take 20 mEq by mouth 2 (two) times daily for 3 days.   predniSONE 20 MG tablet Commonly  known as:  DELTASONE Take 2 tablets (40 mg total) by mouth daily with breakfast for 5 days.   promethazine 25 MG tablet Commonly known as:  PHENERGAN Take 1 tablet (25 mg total) by mouth every 6 (six) hours as needed for nausea or vomiting. What changed:  how much to take        Judeth Horn, NP 11/25/2018  9:15 PM

## 2018-11-25 NOTE — MAU Note (Signed)
Urine in lab 

## 2018-11-25 NOTE — MAU Note (Signed)
Required assistance to get into wc, taken directly to rm.  States can't breath. (resp unlabored when first approached pt). Feels like there is something in her lungs.pain in left side of chest.  Denies cough or fever.  Been having nose bleeds the last 3 morning.

## 2018-11-25 NOTE — Discharge Instructions (Signed)
Hyperemesis Gravidarum  Hyperemesis gravidarum is a severe form of nausea and vomiting that happens during pregnancy. Hyperemesis is worse than morning sickness. It may cause you to have nausea or vomiting all day for many days. It may keep you from eating and drinking enough food and liquids, which can lead to dehydration, malnutrition, and weight loss. Hyperemesis usually occurs during the first half (the first 20 weeks) of pregnancy. It often goes away once a woman is in her second half of pregnancy. However, sometimes hyperemesis continues through an entire pregnancy.  What are the causes?  The cause of this condition is not known. It may be related to changes in chemicals (hormones) in the body during pregnancy, such as the high level of pregnancy hormone (human chorionic gonadotropin) or the increase in the female sex hormone (estrogen).  What are the signs or symptoms?  Symptoms of this condition include:  Nausea that does not go away.  Vomiting that does not allow you to keep any food down.  Weight loss.  Body fluid loss (dehydration).  Having no desire to eat, or not liking food that you have previously enjoyed.  How is this diagnosed?  This condition may be diagnosed based on:  A physical exam.  Your medical history.  Your symptoms.  Blood tests.  Urine tests.  How is this treated?  This condition is managed by controlling symptoms. This may include:  Following an eating plan. This can help lessen nausea and vomiting.  Taking prescription medicines.  An eating plan and medicines are often used together to help control symptoms. If medicines do not help relieve nausea and vomiting, you may need to receive fluids through an IV at the hospital.  Follow these instructions at home:  Eating and drinking    Avoid the following:  Drinking fluids with meals. Try not to drink anything during the 30 minutes before and after your meals.  Drinking more than 1 cup of fluid at a time.  Eating foods that trigger your  symptoms. These may include spicy foods, coffee, high-fat foods, very sweet foods, and acidic foods.  Skipping meals. Nausea can be more intense on an empty stomach. If you cannot tolerate food, do not force it. Try sucking on ice chips or other frozen items and make up for missed calories later.  Lying down within 2 hours after eating.  Being exposed to environmental triggers. These may include food smells, smoky rooms, closed spaces, rooms with strong smells, warm or humid places, overly loud and noisy rooms, and rooms with motion or flickering lights. Try eating meals in a well-ventilated area that is free of strong smells.  Quick and sudden changes in your movement.  Taking iron pills and multivitamins that contain iron. If you take prescription iron pills, do not stop taking them unless your health care provider approves.  Preparing food. The smell of food can spoil your appetite or trigger nausea.  To help relieve your symptoms:  Listen to your body. Everyone is different and has different preferences. Find what works best for you.  Eat and drink slowly.  Eat 5-6 small meals daily instead of 3 large meals. Eating small meals and snacks can help you avoid an empty stomach.  In the morning, before getting out of bed, eat a couple of crackers to avoid moving around on an empty stomach.  Try eating starchy foods as these are usually tolerated well. Examples include cereal, toast, bread, potatoes, pasta, rice, and pretzels.  Include at   least 1 serving of protein with your meals and snacks. Protein options include lean meats, poultry, seafood, beans, nuts, nut butters, eggs, cheese, and yogurt.  Try eating a protein-rich snack before bed. Examples of a protein-rick snack include cheese and crackers or a peanut butter sandwich made with 1 slice of whole-wheat bread and 1 tsp (5 g) of peanut butter.  Eat or suck on things that have ginger in them. It may help relieve nausea. Add  tsp ground ginger to hot tea or  choose ginger tea.  Try drinking 100% fruit juice or an electrolyte drink. An electrolyte drink contains sodium, potassium, and chloride.  Drink fluids that are cold, clear, and carbonated or sour. Examples include lemonade, ginger ale, lemon-lime soda, ice water, and sparkling water.  Brush your teeth or use a mouth rinse after meals.  Talk with your health care provider about starting a supplement of vitamin B6.  General instructions  Take over-the-counter and prescription medicines only as told by your health care provider.  Follow instructions from your health care provider about eating or drinking restrictions.  Continue to take your prenatal vitamins as told by your health care provider. If you are having trouble taking your prenatal vitamins, talk with your health care provider about different options.  Keep all follow-up and pre-birth (prenatal) visits as told by your health care provider. This is important.  Contact a health care provider if:  You have pain in your abdomen.  You have a severe headache.  You have vision problems.  You are losing weight.  You feel weak or dizzy.  Get help right away if:  You cannot drink fluids without vomiting.  You vomit blood.  You have constant nausea and vomiting.  You are very weak.  You faint.  You have a fever and your symptoms suddenly get worse.  Summary  Hyperemesis gravidarum is a severe form of nausea and vomiting that happens during pregnancy.  Making some changes to your eating habits may help relieve nausea and vomiting.  This condition may be managed with medicine.  If medicines do not help relieve nausea and vomiting, you may need to receive fluids through an IV at the hospital.  This information is not intended to replace advice given to you by your health care provider. Make sure you discuss any questions you have with your health care provider.  Document Released: 11/10/2005 Document Revised: 11/30/2017 Document Reviewed: 07/09/2016  Elsevier Interactive  Patient Education  2019 [employer]

## 2018-12-14 ENCOUNTER — Inpatient Hospital Stay (HOSPITAL_COMMUNITY)
Admission: AD | Admit: 2018-12-14 | Discharge: 2018-12-14 | Discharge status: Against Medical Advice | Payer: Medicaid Other | Attending: Obstetrics and Gynecology | Admitting: Obstetrics and Gynecology

## 2018-12-14 ENCOUNTER — Other Ambulatory Visit: Payer: Self-pay

## 2018-12-14 DIAGNOSIS — R109 Unspecified abdominal pain: Secondary | ICD-10-CM | POA: Diagnosis not present

## 2018-12-14 DIAGNOSIS — O26892 Other specified pregnancy related conditions, second trimester: Secondary | ICD-10-CM | POA: Diagnosis not present

## 2018-12-14 DIAGNOSIS — Z3A20 20 weeks gestation of pregnancy: Secondary | ICD-10-CM | POA: Insufficient documentation

## 2018-12-14 DIAGNOSIS — Z5321 Procedure and treatment not carried out due to patient leaving prior to being seen by health care provider: Secondary | ICD-10-CM | POA: Insufficient documentation

## 2018-12-14 NOTE — MAU Note (Signed)
Pt called, not in lobby 

## 2018-12-14 NOTE — MAU Note (Signed)
Was late and missed appt today.  Was to have had [place] to make sure baby is ok. "they had picture where the baby didn't have arms and legs"

## 2018-12-14 NOTE — MAU Note (Signed)
Having some cramps in lower stomach next to pubic bone.  Started about 3 days ago. No bleeding, +d/c  Yesterday- little heavier then usual. No urinary symptoms. Has been having diarrhea, loose, started 4 or 5 days ago, several times a day.  Wanting to know if she can get an [place] before she leaves the state

## 2019-01-27 ENCOUNTER — Other Ambulatory Visit (HOSPITAL_COMMUNITY)
Admission: RE | Admit: 2019-01-27 | Discharge: 2019-01-27 | Disposition: A | Discharge status: Home / Self Care | Payer: Medicaid Other | Source: Ambulatory Visit | Attending: Obstetrics | Admitting: Obstetrics

## 2019-01-27 ENCOUNTER — Ambulatory Visit (INDEPENDENT_AMBULATORY_CARE_PROVIDER_SITE_OTHER): Payer: Medicaid Other | Admitting: Obstetrics

## 2019-01-27 ENCOUNTER — Encounter: Payer: Self-pay | Admitting: Obstetrics

## 2019-01-27 VITALS — BP 104/70 | HR 93 | Wt 167.0 lb

## 2019-01-27 DIAGNOSIS — O0990 Supervision of high risk pregnancy, unspecified, unspecified trimester: Secondary | ICD-10-CM | POA: Insufficient documentation

## 2019-01-27 DIAGNOSIS — Z3480 Encounter for supervision of other normal pregnancy, unspecified trimester: Secondary | ICD-10-CM | POA: Diagnosis present

## 2019-01-27 DIAGNOSIS — Z3A26 26 weeks gestation of pregnancy: Secondary | ICD-10-CM

## 2019-01-27 DIAGNOSIS — Z9119 Patient's noncompliance with other medical treatment and regimen: Secondary | ICD-10-CM

## 2019-01-27 DIAGNOSIS — O09892 Supervision of other high risk pregnancies, second trimester: Secondary | ICD-10-CM | POA: Diagnosis not present

## 2019-01-27 DIAGNOSIS — O09899 Supervision of other high risk pregnancies, unspecified trimester: Secondary | ICD-10-CM

## 2019-01-27 DIAGNOSIS — Z91199 Patient's noncompliance with other medical treatment and regimen due to unspecified reason: Secondary | ICD-10-CM

## 2019-01-27 NOTE — Progress Notes (Signed)
Pt is here for initial OB visit, [redacted]w[redacted]d. Last pap 09/10/18 normal.

## 2019-01-27 NOTE — Progress Notes (Signed)
Subjective:    Alyssa [name] is being seen today for her first obstetrical visit.  This is not a planned pregnancy. She is at [redacted]w[redacted]d gestation. Her obstetrical history is significant for non-compliance. Relationship with FOB: significant other, not living together. Patient does intend to breast feed. Pregnancy history fully reviewed.  The information documented in the HPI was reviewed and verified.  Menstrual History: OB History    Gravida  1   Para  0   Term  0   Preterm  0   AB  0   Living  0     SAB  0   TAB  0   Ectopic  0   Multiple  0   Live Births  0            Patient's last menstrual period was 07/28/2018 (exact date).    Past Medical History:  Diagnosis Date  . ADHD (attention deficit hyperactivity disorder)   . Angio-edema   . Asthma   . Eczema   . Recurrent upper respiratory infection (URI)   . Urticaria     Past Surgical History:  Procedure Laterality Date  . WISDOM TOOTH EXTRACTION      (Not in a hospital admission)  Allergies  Allergen Reactions  . Shellfish Allergy Hives    "Throat closes"  . Banana Itching and Swelling  . Peach Flavor Itching    Social History   Tobacco Use  . Smoking status: Never Smoker  . Smokeless tobacco: Never Used  Substance Use Topics  . Alcohol use: No    Family History  Problem Relation Age of Onset  . Asthma Mother   . Eczema Mother   . Allergic rhinitis Mother   . Angioedema Mother   . Asthma Brother   . Asthma Brother   . Allergic rhinitis Brother   . Urticaria Neg Hx   . Immunodeficiency Neg Hx      Review of Systems Constitutional: negative for weight loss Gastrointestinal: negative for vomiting Genitourinary:negative for genital lesions and vaginal discharge and dysuria Musculoskeletal:negative for back pain Behavioral/Psych: negative for abusive relationship, depression, illegal drug usage and tobacco use    Objective:    BP 104/70   Pulse 93   Wt 167 lb (75.8 kg)    LMP 07/28/2018 (Exact Date)   BMI 29.82 kg/m  General Appearance:    Alert, cooperative, no distress, appears stated age  Head:    Normocephalic, without obvious abnormality, atraumatic  Eyes:    PERRL, conjunctiva/corneas clear, EOM's intact, fundi    benign, both eyes  Ears:    Normal TM's and external ear canals, both ears  Nose:   Nares normal, septum midline, mucosa normal, no drainage    or sinus tenderness  Throat:   Lips, mucosa, and tongue normal; teeth and gums normal  Neck:   Supple, symmetrical, trachea midline, no adenopathy;    thyroid:  no enlargement/tenderness/nodules; no carotid   bruit or JVD  Back:     Symmetric, no curvature, ROM normal, no CVA tenderness  Lungs:     Clear to auscultation bilaterally, respirations unlabored  Chest Wall:    No tenderness or deformity   Heart:    Regular rate and rhythm, S1 and S2 normal, no murmur, rub   or gallop  Breast Exam:    No tenderness, masses, or nipple abnormality  Abdomen:     Soft, non-tender, bowel sounds active all four quadrants,    no masses, no organomegaly  Genitalia:    Normal female without lesion, discharge or tenderness  Extremities:   Extremities normal, atraumatic, no cyanosis or edema  Pulses:   2+ and symmetric all extremities  Skin:   Skin color, texture, turgor normal, no rashes or lesions  Lymph nodes:   Cervical, supraclavicular, and axillary nodes normal  Neurologic:   CNII-XII intact, normal strength, sensation and reflexes    throughout      Lab Review Urine pregnancy test Labs reviewed yes Radiologic studies reviewed no  Assessment:    Pregnancy at [redacted]w[redacted]d weeks    Plan:     1. Supervision of high risk pregnancy, antepartum Rx: - [place] MFM OB COMP + 14 WK; Future - Obstetric Panel, Including HIV - Hemoglobinopathy evaluation - Cystic Fibrosis Mutation 97 - SMN1 COPY NUMBER ANALYSIS (SMA Carrier Screen) - Cervicovaginal ancillary only( [place])  2. Pregnancy complicated by  noncompliance, antepartum - dismissed from previous practice during this pregnancy for noncompliance   Prenatal vitamins.  Counseling provided regarding continued use of seat belts, cessation of alcohol consumption, smoking or use of illicit drugs; infection precautions i.e., influenza/TDAP immunizations, toxoplasmosis,CMV, parvovirus, listeria and varicella; workplace safety, exercise during pregnancy; routine dental care, safe medications, sexual activity, hot tubs, saunas, pools, travel, caffeine use, fish and methlymercury, potential toxins, hair treatments, varicose veins Weight gain recommendations per IOM guidelines reviewed: underweight/BMI< 18.5--> gain 28 - 40 lbs; normal weight/BMI 18.5 - 24.9--> gain 25 - 35 lbs; overweight/BMI 25 - 29.9--> gain 15 - 25 lbs; obese/BMI >30->gain  11 - 20 lbs Problem list reviewed and updated. FIRST/CF mutation testing/NIPT/QUAD SCREEN/fragile X/Ashkenazi Jewish population testing/Spinal muscular atrophy discussed: requested. Role of ultrasound in pregnancy discussed; fetal survey: requested. Amniocentesis discussed: not indicated.  No orders of the defined types were placed in this encounter.  Orders Placed This Encounter  Procedures  . [place] MFM OB COMP + 14 WK    Standing Status:   Future    Standing Expiration Date:   03/28/2020    Order Specific Question:   Reason for Exam (SYMPTOM  OR DIAGNOSIS REQUIRED)    Answer:   Anatomy    Order Specific Question:   Preferred Location    Answer:   [facility]  . Obstetric Panel, Including HIV  . Hemoglobinopathy evaluation  . Cystic Fibrosis Mutation 97  . SMN1 COPY NUMBER ANALYSIS (SMA Carrier Screen)    Follow up in 2 weeks. 50% of 20 min visit spent on counseling and coordination of care.     Brock Bad MD 01-27-2019

## 2019-01-28 ENCOUNTER — Other Ambulatory Visit: Payer: Self-pay | Admitting: Obstetrics

## 2019-01-28 DIAGNOSIS — B9689 Other specified bacterial agents as the cause of diseases classified elsewhere: Secondary | ICD-10-CM

## 2019-01-28 DIAGNOSIS — N760 Acute vaginitis: Principal | ICD-10-CM

## 2019-01-28 LAB — OBSTETRIC PANEL, INCLUDING HIV
Antibody Screen: NEGATIVE
Basophils Absolute: 0 10*3/uL (ref 0.0–0.2)
Basos: 0 %
EOS (ABSOLUTE): 0.2 10*3/uL (ref 0.0–0.4)
Eos: 1 %
HIV SCREEN 4TH GENERATION: NONREACTIVE
Hematocrit: 33.7 % — ABNORMAL LOW (ref 34.0–46.6)
Hemoglobin: 11.4 g/dL (ref 11.1–15.9)
Hepatitis B Surface Ag: NEGATIVE
Immature Grans (Abs): 0.1 10*3/uL (ref 0.0–0.1)
Immature Granulocytes: 1 %
Lymphocytes Absolute: 2.5 10*3/uL (ref 0.7–3.1)
Lymphs: 19 %
MCH: 29.2 pg (ref 26.6–33.0)
MCHC: 33.8 g/dL (ref 31.5–35.7)
MCV: 86 fL (ref 79–97)
Monocytes Absolute: 1 10*3/uL — ABNORMAL HIGH (ref 0.1–0.9)
Monocytes: 8 %
NEUTROS ABS: 9.3 10*3/uL — AB (ref 1.4–7.0)
Neutrophils: 71 %
Platelets: 240 10*3/uL (ref 150–450)
RBC: 3.91 x10E6/uL (ref 3.77–5.28)
RDW: 12.8 % (ref 11.7–15.4)
RPR Ser Ql: NONREACTIVE
Rh Factor: POSITIVE
Rubella Antibodies, IGG: 14.5 index (ref >0.99)
WBC: 13.1 10*3/uL — ABNORMAL HIGH (ref 3.4–10.8)

## 2019-01-28 LAB — CERVICOVAGINAL ANCILLARY ONLY
Bacterial vaginitis: POSITIVE — AB
Candida vaginitis: NEGATIVE
Chlamydia: NEGATIVE
Neisseria Gonorrhea: NEGATIVE
Trichomonas: NEGATIVE

## 2019-01-28 MED ORDER — TINIDAZOLE 500 MG PO TABS: 1000.0000 mg | ORAL_TABLET | Freq: Every day | ORAL | 2 refills | Status: DC

## 2019-02-02 ENCOUNTER — Encounter (HOSPITAL_COMMUNITY): Payer: Self-pay

## 2019-02-07 LAB — SMN1 COPY NUMBER ANALYSIS (SMA CARRIER SCREENING)

## 2019-02-07 LAB — HEMOGLOBINOPATHY EVALUATION
HEMOGLOBIN A2 QUANTITATION: 2 % (ref 1.8–3.2)
HGB C: 0 %
HGB S: 0 %
HGB VARIANT: 0 %
Hemoglobin F Quantitation: 0 % (ref 0.0–2.0)
Hgb A: 98 % (ref 96.4–98.8)

## 2019-02-07 LAB — CYSTIC FIBROSIS MUTATION 97: Interpretation: NOT DETECTED

## 2019-02-09 ENCOUNTER — Ambulatory Visit (HOSPITAL_COMMUNITY): Payer: Medicaid Other

## 2019-02-10 ENCOUNTER — Encounter: Payer: Medicaid Other | Admitting: Obstetrics

## 2019-02-10 ENCOUNTER — Other Ambulatory Visit: Payer: Medicaid Other

## 2019-02-21 ENCOUNTER — Ambulatory Visit (INDEPENDENT_AMBULATORY_CARE_PROVIDER_SITE_OTHER): Payer: Medicaid Other | Admitting: Obstetrics

## 2019-02-21 ENCOUNTER — Other Ambulatory Visit: Payer: Medicaid Other

## 2019-02-21 ENCOUNTER — Other Ambulatory Visit: Payer: Self-pay

## 2019-02-21 ENCOUNTER — Encounter: Payer: Self-pay | Admitting: Obstetrics & Gynecology

## 2019-02-21 ENCOUNTER — Encounter: Payer: Self-pay | Admitting: Obstetrics

## 2019-02-21 VITALS — BP 120/71 | HR 97 | Wt 173.0 lb

## 2019-02-21 DIAGNOSIS — O0930 Supervision of pregnancy with insufficient antenatal care, unspecified trimester: Secondary | ICD-10-CM | POA: Insufficient documentation

## 2019-02-21 DIAGNOSIS — Z3480 Encounter for supervision of other normal pregnancy, unspecified trimester: Secondary | ICD-10-CM

## 2019-02-21 DIAGNOSIS — Z3483 Encounter for supervision of other normal pregnancy, third trimester: Secondary | ICD-10-CM

## 2019-02-21 DIAGNOSIS — Z3A29 29 weeks gestation of pregnancy: Secondary | ICD-10-CM

## 2019-02-21 NOTE — Progress Notes (Signed)
Pt presents for ROB and 2 gtt labs. No concerns today per pt.  Tdap offered; pt declined.

## 2019-02-21 NOTE — Progress Notes (Signed)
Subjective:  Alyssa [name] is a 23 y.o. G1P0000 at [redacted]w[redacted]d being seen today for ongoing prenatal care.  She is currently monitored for the following issues for this low-risk pregnancy and has Food allergy; Seasonal and perennial allergic rhinoconjunctivitis; Atopic dermatitis; Intermittent coughing/wheezing; and Supervision of other normal pregnancy, antepartum on their problem list.  Patient reports no complaints.  Contractions: Not present. Vag. Bleeding: None.  Movement: Present. Denies leaking of fluid.   The following portions of the patient's history were reviewed and updated as appropriate: allergies, current medications, past family history, past medical history, past social history, past surgical history and problem list. Problem list updated.  Objective:   Vitals:   02/21/19 1003  BP: 120/71  Pulse: 97  Weight: 173 lb (78.5 kg)    Fetal Status:     Movement: Present     General:  Alert, oriented and cooperative. Patient is in no acute distress.  Skin: Skin is warm and dry. No rash noted.   Cardiovascular: Normal heart rate noted  Respiratory: Normal respiratory effort, no problems with respiration noted  Abdomen: Soft, gravid, appropriate for gestational age. Pain/Pressure: Present     Pelvic:  Cervical exam deferred        Extremities: Normal range of motion.  Edema: None  Mental Status: Normal mood and affect. Normal behavior. Normal judgment and thought content.   Urinalysis:      Assessment and Plan:  Pregnancy: G1P0000 at [redacted]w[redacted]d  1. Supervision of other normal pregnancy, antepartum  Preterm labor symptoms and general obstetric precautions including but not limited to vaginal bleeding, contractions, leaking of fluid and fetal movement were reviewed in detail with the patient. Please refer to After Visit Summary for other counseling recommendations.  Return in about 2 weeks (around 03/07/2019) for ROB.   Brock Bad, MD

## 2019-02-22 LAB — GLUCOSE TOLERANCE, 2 HOURS W/ 1HR
Glucose, 1 hour: 93 mg/dL (ref 65–179)
Glucose, 2 hour: 79 mg/dL (ref 65–152)
Glucose, Fasting: 77 mg/dL (ref 65–91)

## 2019-02-22 LAB — CBC
Hematocrit: 34.3 % (ref 34.0–46.6)
Hemoglobin: 11.3 g/dL (ref 11.1–15.9)
MCH: 28.4 pg (ref 26.6–33.0)
MCHC: 32.9 g/dL (ref 31.5–35.7)
MCV: 86 fL (ref 79–97)
Platelets: 213 10*3/uL (ref 150–450)
RBC: 3.98 x10E6/uL (ref 3.77–5.28)
RDW: 12.5 % (ref 11.7–15.4)
WBC: 10.4 10*3/uL (ref 3.4–10.8)

## 2019-02-22 LAB — HIV ANTIBODY (ROUTINE TESTING W REFLEX): HIV Screen 4th Generation wRfx: NONREACTIVE

## 2019-02-22 LAB — RPR: RPR Ser Ql: NONREACTIVE

## 2019-03-01 ENCOUNTER — Other Ambulatory Visit (HOSPITAL_COMMUNITY): Payer: Medicaid Other

## 2019-03-07 ENCOUNTER — Encounter: Payer: Medicaid Other | Admitting: Obstetrics

## 2019-03-07 ENCOUNTER — Other Ambulatory Visit: Payer: Self-pay

## 2019-03-08 ENCOUNTER — Ambulatory Visit (INDEPENDENT_AMBULATORY_CARE_PROVIDER_SITE_OTHER): Payer: Medicaid Other | Admitting: Obstetrics & Gynecology

## 2019-03-08 DIAGNOSIS — R062 Wheezing: Secondary | ICD-10-CM

## 2019-03-08 DIAGNOSIS — Z3480 Encounter for supervision of other normal pregnancy, unspecified trimester: Secondary | ICD-10-CM

## 2019-03-08 DIAGNOSIS — O26893 Other specified pregnancy related conditions, third trimester: Secondary | ICD-10-CM | POA: Diagnosis not present

## 2019-03-08 DIAGNOSIS — Z3A31 31 weeks gestation of pregnancy: Secondary | ICD-10-CM | POA: Diagnosis not present

## 2019-03-08 MED ORDER — ALBUTEROL SULFATE HFA 108 (90 BASE) MCG/ACT IN AERS: INHALATION_SPRAY | RESPIRATORY_TRACT | 6 refills | Status: DC

## 2019-03-08 NOTE — Progress Notes (Signed)
   TELEHEALTH VIRTUAL OBSTETRICS VISIT ENCOUNTER NOTE  I connected with Alyssa [name] on 03/08/19 at  2:15 PM EDT by telephone at home and verified that I am speaking with the correct person using two identifiers.   I discussed the limitations, risks, security and privacy concerns of performing an evaluation and management service by telephone and the availability of in person appointments. I also discussed with the patient that there may be a patient responsible charge related to this service. The patient expressed understanding and agreed to proceed.  Subjective:  Alyssa [name] is a 23 y.o. G1P0000 at [redacted]w[redacted]d being followed for ongoing prenatal care.  She is currently monitored for the following issues for this low-risk pregnancy and has Food allergy; Seasonal and perennial allergic rhinoconjunctivitis; Atopic dermatitis; Intermittent coughing/wheezing; Supervision of other normal pregnancy, antepartum; and Late prenatal care affecting pregnancy on their problem list.  Patient reports no complaints. Reports fetal movement. Denies any contractions, bleeding or leaking of fluid.   The following portions of the patient's history were reviewed and updated as appropriate: allergies, current medications, past family history, past medical history, past social history, past surgical history and problem list.   Objective:   General:  Alert, oriented and cooperative.   Mental Status: Normal mood and affect perceived. Normal judgment and thought content.  Rest of physical exam deferred due to type of encounter  Assessment and Plan:  Pregnancy: G1P0000 at [redacted]w[redacted]d 1. Supervision of other normal pregnancy, antepartum - She will come by to pick up a BP cuff today - She does not have a scale  2. Wheezing  - albuterol (PROAIR HFA) 108 (90 Base) MCG/ACT inhaler; TWO PUFFS EVERY 4-6 HOURS AS NEEDED FOR COUGH OR WHEEZE.  Dispense: 1 Inhaler; Refill: 6  Preterm labor symptoms and general  obstetric precautions including but not limited to vaginal bleeding, contractions, leaking of fluid and fetal movement were reviewed in detail with the patient.  I discussed the assessment and treatment plan with the patient. The patient was provided an opportunity to ask questions and all were answered. The patient agreed with the plan and demonstrated an understanding of the instructions. The patient was advised to call back or seek an in-person office evaluation/go to MAU at [facility] for any urgent or concerning symptoms. Please refer to After Visit Summary for other counseling recommendations.   I provided 8 minutes of non-face-to-face time during this encounter.  No follow-ups on file.  Future Appointments  Date Time Provider Department Center  03/08/2019  2:15 PM Allie Bossier, MD [facility] None  03/11/2019 12:45 PM [facility] [place] 2 [facility]    Allie Bossier, MD [facility] [employer], [facility]

## 2019-03-08 NOTE — Progress Notes (Signed)
Pt picked up BP cuff today. Vitals complete. Pt said she has the Babyscript link but has not activated the acct yet. Pt aware to activate acct and enter BP readings in weekly.

## 2019-03-11 ENCOUNTER — Other Ambulatory Visit: Payer: Self-pay

## 2019-03-11 ENCOUNTER — Other Ambulatory Visit: Payer: Self-pay | Admitting: Obstetrics

## 2019-03-11 ENCOUNTER — Ambulatory Visit (HOSPITAL_COMMUNITY)
Admission: RE | Admit: 2019-03-11 | Discharge: 2019-03-11 | Disposition: A | Discharge status: Home / Self Care | Payer: Medicaid Other | Source: Ambulatory Visit | Attending: Obstetrics and Gynecology | Admitting: Obstetrics and Gynecology

## 2019-03-11 DIAGNOSIS — O368330 Maternal care for abnormalities of the fetal heart rate or rhythm, third trimester, not applicable or unspecified: Secondary | ICD-10-CM | POA: Diagnosis not present

## 2019-03-11 DIAGNOSIS — Z3A32 32 weeks gestation of pregnancy: Secondary | ICD-10-CM

## 2019-03-11 DIAGNOSIS — O99213 Obesity complicating pregnancy, third trimester: Secondary | ICD-10-CM | POA: Diagnosis not present

## 2019-03-11 DIAGNOSIS — O0990 Supervision of high risk pregnancy, unspecified, unspecified trimester: Secondary | ICD-10-CM | POA: Diagnosis present

## 2019-03-11 DIAGNOSIS — Z363 Encounter for antenatal screening for malformations: Secondary | ICD-10-CM | POA: Diagnosis not present

## 2019-03-11 DIAGNOSIS — O0933 Supervision of pregnancy with insufficient antenatal care, third trimester: Secondary | ICD-10-CM

## 2019-03-14 ENCOUNTER — Other Ambulatory Visit (HOSPITAL_COMMUNITY): Payer: Self-pay | Admitting: *Deleted

## 2019-03-14 DIAGNOSIS — Z362 Encounter for other antenatal screening follow-up: Secondary | ICD-10-CM

## 2019-03-22 ENCOUNTER — Encounter: Payer: Medicaid Other | Admitting: Obstetrics

## 2019-03-29 ENCOUNTER — Other Ambulatory Visit: Payer: Self-pay

## 2019-03-29 ENCOUNTER — Inpatient Hospital Stay (HOSPITAL_COMMUNITY)
Admission: AD | Admit: 2019-03-29 | Discharge: 2019-03-30 | Disposition: A | Discharge status: Home / Self Care | Payer: Medicaid Other | Attending: Family Medicine | Admitting: Family Medicine

## 2019-03-29 DIAGNOSIS — R102 Pelvic and perineal pain: Secondary | ICD-10-CM

## 2019-03-29 DIAGNOSIS — O26893 Other specified pregnancy related conditions, third trimester: Secondary | ICD-10-CM | POA: Insufficient documentation

## 2019-03-29 DIAGNOSIS — O99513 Diseases of the respiratory system complicating pregnancy, third trimester: Secondary | ICD-10-CM | POA: Diagnosis not present

## 2019-03-29 DIAGNOSIS — Z7951 Long term (current) use of inhaled steroids: Secondary | ICD-10-CM | POA: Diagnosis not present

## 2019-03-29 DIAGNOSIS — Z3A35 35 weeks gestation of pregnancy: Secondary | ICD-10-CM | POA: Diagnosis not present

## 2019-03-29 DIAGNOSIS — O26899 Other specified pregnancy related conditions, unspecified trimester: Secondary | ICD-10-CM

## 2019-03-29 DIAGNOSIS — O99343 Other mental disorders complicating pregnancy, third trimester: Secondary | ICD-10-CM | POA: Diagnosis not present

## 2019-03-29 DIAGNOSIS — F909 Attention-deficit hyperactivity disorder, unspecified type: Secondary | ICD-10-CM | POA: Diagnosis not present

## 2019-03-29 DIAGNOSIS — Z3A34 34 weeks gestation of pregnancy: Secondary | ICD-10-CM

## 2019-03-29 DIAGNOSIS — G44209 Tension-type headache, unspecified, not intractable: Secondary | ICD-10-CM | POA: Diagnosis not present

## 2019-03-29 DIAGNOSIS — R51 Headache: Secondary | ICD-10-CM | POA: Insufficient documentation

## 2019-03-29 DIAGNOSIS — J45909 Unspecified asthma, uncomplicated: Secondary | ICD-10-CM | POA: Insufficient documentation

## 2019-03-29 MED ORDER — ACETAMINOPHEN 325 MG PO TABS: 650.0000 mg | ORAL_TABLET | Freq: Once | ORAL | Status: DC

## 2019-03-29 NOTE — MAU Provider Note (Signed)
Chief Complaint:  Headache   First Provider Initiated Contact with Patient 03/29/19 2350      HPI: Alyssa [name] is a 23 y.o. G1P0000 at 51w6dwho presents to maternity admissions reporting pressure in vagina.  Does not think she has had any contractions.  Also c/o headache which has persisted for a week.  Had some swelling in her face.  . She reports good fetal movement, denies LOF, vaginal bleeding, vaginal itching/burning, urinary symptoms, dizziness, n/v, diarrhea, constipation or fever/chills.  She denies  RUQ abdominal pain.  RN Note: Pt c/o vag pressure for the past week that feels different today. She reports a headache that started today and seeing spots for the past week;  has not taken anything for the h/a. States the baby is moving well and denies any vag bleeding or leaking.  Past Medical History: Past Medical History:  Diagnosis Date  . ADHD (attention deficit hyperactivity disorder)   . Angio-edema   . Asthma   . Eczema   . Recurrent upper respiratory infection (URI)   . Urticaria     Past obstetric history: OB History  Gravida Para Term Preterm AB Living  1 0 0 0 0 0  SAB TAB Ectopic Multiple Live Births  0 0 0 0 0    # Outcome Date GA Lbr Len/2nd Weight Sex Delivery Anes PTL Lv  1 Current             Past Surgical History: Past Surgical History:  Procedure Laterality Date  . WISDOM TOOTH EXTRACTION      Family History: Family History  Problem Relation Age of Onset  . Asthma Mother   . Eczema Mother   . Allergic rhinitis Mother   . Angioedema Mother   . Asthma Brother   . Asthma Brother   . Allergic rhinitis Brother   . Urticaria Neg Hx   . Immunodeficiency Neg Hx     Social History: Social History   Tobacco Use  . Smoking status: Never Smoker  . Smokeless tobacco: Never Used  Substance Use Topics  . Alcohol use: No  . Drug use: Not Currently    Types: Marijuana    Comment: last used 12/19    Allergies:  Allergies  Allergen  Reactions  . Shellfish Allergy Hives    "Throat closes"  . Banana Itching and Swelling  . Peach Flavor Itching    Meds:  Medications Prior to Admission  Medication Sig Dispense Refill Last Dose  . albuterol (PROAIR HFA) 108 (90 Base) MCG/ACT inhaler TWO PUFFS EVERY 4-6 HOURS AS NEEDED FOR COUGH OR WHEEZE. 1 Inhaler 6   . albuterol (PROVENTIL) (2.5 MG/3ML) 0.083% nebulizer solution Take 3 mLs (2.5 mg total) by nebulization every 6 (six) hours as needed for wheezing or shortness of breath. (Patient not taking: Reported on 02/21/2019) 75 mL 12 Not Taking  . EPINEPHrine 0.3 mg/0.3 mL IJ SOAJ injection Inject 0.3 mLs (0.3 mg total) into the muscle once. (Patient not taking: Reported on 02/21/2019) 2 Device 2 Not Taking  . glycopyrrolate (ROBINUL) 1 MG tablet Take 1 tablet (1 mg total) by mouth 3 (three) times daily. For spitting (Patient not taking: Reported on 01/27/2019) 30 tablet 1 Not Taking  . ondansetron (ZOFRAN ODT) 4 MG disintegrating tablet Take 1 tablet (4 mg total) by mouth every 8 (eight) hours as needed for nausea or vomiting. (Patient not taking: Reported on 01/27/2019) 20 tablet 0 Not Taking  . potassium chloride (KLOR-CON) 20 MEQ packet Take 20 mEq  by mouth 2 (two) times daily for 3 days. 6 packet 0   . Prenatal Vit-Fe Fumarate-FA (MULTIVITAMIN-PRENATAL) 27-0.8 MG TABS tablet Take 1 tablet by mouth daily at 12 noon.   Taking  . promethazine (PHENERGAN) 25 MG tablet Take 1 tablet (25 mg total) by mouth every 6 (six) hours as needed for nausea or vomiting. (Patient not taking: Reported on 01/27/2019) 30 tablet 0 Not Taking  . tinidazole (TINDAMAX) 500 MG tablet Take 2 tablets (1,000 mg total) by mouth daily with breakfast. (Patient not taking: Reported on 02/21/2019) 10 tablet 2 Not Taking    I have reviewed patient's Past Medical Hx, Surgical Hx, Family Hx, Social Hx, medications and allergies.   ROS:  Review of Systems  Constitutional: Negative for chills and fever.  Eyes: Positive for  visual disturbance.  Respiratory: Negative for shortness of breath.   Gastrointestinal: Negative for abdominal pain, constipation and diarrhea.  Genitourinary: Positive for pelvic pain (pressure, not pain). Negative for vaginal bleeding.  Musculoskeletal: Negative for back pain.  Neurological: Positive for headaches.   Other systems negative  Physical Exam   Patient Vitals for the past 24 hrs:  BP Temp Temp src Pulse Resp Height Weight  03/29/19 2337 119/71 98.4 F (36.9 C) Oral 84 16  (1.575 m) 86.6 kg   Vitals:   03/29/19 2337 03/30/19 0006 03/30/19 0144  BP: 119/71 118/65 114/68  Pulse: 84 73 84  Resp: 16  16  Temp: 98.4 F (36.9 C)  97.7 F (36.5 C)  TempSrc: Oral  Oral  SpO2:   100%  Weight: 86.6 kg    Height:  (1.575 m)      Constitutional: Well-developed, well-nourished female in no acute distress.  Cardiovascular: normal rate and rhythm Respiratory: normal effort, clear to auscultation bilaterally GI: Abd soft, non-tender, gravid appropriate for gestational age.   No rebound or guarding. MS: Extremities nontender, no edema, normal ROM Neurologic: Alert and oriented x 4.  GU: Neg CVAT.  PELVIC EXAM:   Dilation: Closed Effacement (%): 40 Station: -3 Exam by:: Wynelle Bourgeois CNM  FHT:  Baseline 140 , moderate variability, accelerations present, no decelerations Contractions: Occasional and Irregular     Labs: Results for orders placed or performed during the hospital encounter of 03/29/19 (from the past 24 hour(s))  Urinalysis, Routine w reflex microscopic     Status: Abnormal   Collection Time: 03/29/19 11:59 PM  Result Value Ref Range   Color, Urine YELLOW YELLOW   APPearance HAZY (A) CLEAR   Specific Gravity, Urine 1.025 1.005 - 1.030   pH 6.0 5.0 - 8.0   Glucose, UA NEGATIVE NEGATIVE mg/dL   Hgb urine dipstick NEGATIVE NEGATIVE   Bilirubin Urine NEGATIVE NEGATIVE   Ketones, ur NEGATIVE NEGATIVE mg/dL   Protein, ur NEGATIVE NEGATIVE mg/dL    Nitrite NEGATIVE NEGATIVE   Leukocytes,Ua MODERATE (A) NEGATIVE   RBC / HPF 0-5 0 - 5 RBC/hpf   WBC, UA 0-5 0 - 5 WBC/hpf   Bacteria, UA RARE (A) NONE SEEN   Squamous Epithelial / LPF 0-5 0 - 5   Mucus PRESENT     AB/Positive/-- (03/05 1345)  Imaging:    MAU Course/MDM: I have ordered labs and reviewed results. UA is grossly normal except for leukocytes NST reviewed and is reactive  Treatments in MAU included Tylenol for headache  After medication, patient stated her headache and pressure are entirely gone. .    Assessment: Single intrauterine pregnancy at [redacted]w[redacted]d Acute non  intractable tension-type headache - Plan: Discharge patient  Pelvic pressure in pregnancy - Plan: Discharge patient  Plan: Discharge home Labor precautions and fetal kick counts Follow up in Office for prenatal visits and recheck  Encouraged to return here or to other Urgent Care/ED if she develops worsening of symptoms, increase in pain, fever, or other concerning symptoms.   Pt stable at time of discharge.  Wynelle BourgeoisMarie Theresia Pree CNM, MSN Certified Nurse-Midwife 03/29/2019 11:50 PM

## 2019-03-29 NOTE — MAU Note (Signed)
Pt c/o vag pressure for the past week that feels different today. She reports a headache that started today and seeing spots for the past week;  has not taken anything for the h/a. States the baby is moving well and denies any vag bleeding or leaking.

## 2019-03-30 ENCOUNTER — Encounter (HOSPITAL_COMMUNITY): Payer: Self-pay

## 2019-03-30 LAB — URINALYSIS, ROUTINE W REFLEX MICROSCOPIC
Bilirubin Urine: NEGATIVE
Glucose, UA: NEGATIVE mg/dL
Hgb urine dipstick: NEGATIVE
Ketones, ur: NEGATIVE mg/dL
Nitrite: NEGATIVE
Protein, ur: NEGATIVE mg/dL
Specific Gravity, Urine: 1.025 (ref 1.005–1.030)
pH: 6 (ref 5.0–8.0)

## 2019-03-30 MED ORDER — ACETAMINOPHEN 160 MG/5ML PO SOLN
500.0000 mg | Freq: Once | ORAL | Status: AC
  Administered 2019-03-30: 500 mg via ORAL
  Filled 2019-03-30: qty 20.3

## 2019-03-30 NOTE — Discharge Instructions (Signed)
Third Trimester of Pregnancy The third trimester is from week 28 through week 40 (months 7 through 9). The third trimester is a time when the unborn baby (fetus) is growing rapidly. At the end of the ninth month, the fetus is about 20 inches in length and weighs 6-10 pounds. Body changes during your third trimester Your body will continue to go through many changes during pregnancy. The changes vary from woman to woman. During the third trimester:  Your weight will continue to increase. You can expect to gain 25-35 pounds (11-16 kg) by the end of the pregnancy.  You may begin to get stretch marks on your hips, abdomen, and breasts.  You may urinate more often because the fetus is moving lower into your pelvis and pressing on your bladder.  You may develop or continue to have heartburn. This is caused by increased hormones that slow down muscles in the digestive tract.  You may develop or continue to have constipation because increased hormones slow digestion and cause the muscles that push waste through your intestines to relax.  You may develop hemorrhoids. These are swollen veins (varicose veins) in the rectum that can itch or be painful.  You may develop swollen, bulging veins (varicose veins) in your legs.  You may have increased body aches in the pelvis, back, or thighs. This is due to weight gain and increased hormones that are relaxing your joints.  You may have changes in your hair. These can include thickening of your hair, rapid growth, and changes in texture. Some women also have hair loss during or after pregnancy, or hair that feels dry or thin. Your hair will most likely return to normal after your baby is born.  Your breasts will continue to grow and they will continue to become tender. A yellow fluid (colostrum) may leak from your breasts. This is the first milk you are producing for your baby.  Your belly button may stick out.  You may notice more swelling in your hands,  face, or ankles.  You may have increased tingling or numbness in your hands, arms, and legs. The skin on your belly may also feel numb.  You may feel short of breath because of your expanding uterus.  You may have more problems sleeping. This can be caused by the size of your belly, increased need to urinate, and an increase in your body's metabolism.  You may notice the fetus "dropping," or moving lower in your abdomen (lightening).  You may have increased vaginal discharge.  You may notice your joints feel loose and you may have pain around your pelvic bone. What to expect at prenatal visits You will have prenatal exams every 2 weeks until week 36. Then you will have weekly prenatal exams. During a routine prenatal visit:  You will be weighed to make sure you and the baby are growing normally.  Your blood pressure will be taken.  Your abdomen will be measured to track your baby's growth.  The fetal heartbeat will be listened to.  Any test results from the previous visit will be discussed.  You may have a cervical check near your due date to see if your cervix has softened or thinned (effaced).  You will be tested for Group B streptococcus. This happens between 35 and 37 weeks. Your health care provider may ask you:  What your birth plan is.  How you are feeling.  If you are feeling the baby move.  If you have had any abnormal   symptoms, such as leaking fluid, bleeding, severe headaches, or abdominal cramping.  If you are using any tobacco products, including cigarettes, chewing tobacco, and electronic cigarettes.  If you have any questions. Other tests or screenings that may be performed during your third trimester include:  Blood tests that check for low iron levels (anemia).  Fetal testing to check the health, activity level, and growth of the fetus. Testing is done if you have certain medical conditions or if there are problems during the pregnancy.  Nonstress test  (NST). This test checks the health of your baby to make sure there are no signs of problems, such as the baby not getting enough oxygen. During this test, a belt is placed around your belly. The baby is made to move, and its heart rate is monitored during movement. What is false labor? False labor is a condition in which you feel small, irregular tightenings of the muscles in the womb (contractions) that usually go away with rest, changing position, or drinking water. These are called Braxton Hicks contractions. Contractions may last for hours, days, or even weeks before true labor sets in. If contractions come at regular intervals, become more frequent, increase in intensity, or become painful, you should see your health care provider. What are the signs of labor?  Abdominal cramps.  Regular contractions that start at 10 minutes apart and become stronger and more frequent with time.  Contractions that start on the top of the uterus and spread down to the lower abdomen and back.  Increased pelvic pressure and dull back pain.  A watery or bloody mucus discharge that comes from the vagina.  Leaking of amniotic fluid. This is also known as your "water breaking." It could be a slow trickle or a gush. Let your health care provider know if it has a color or strange odor. If you have any of these signs, call your health care provider right away, even if it is before your due date. Follow these instructions at home: Medicines  Follow your health care provider's instructions regarding medicine use. Specific medicines may be either safe or unsafe to take during pregnancy.  Take a prenatal vitamin that contains at least 600 micrograms (mcg) of folic acid.  If you develop constipation, try taking a stool softener if your health care provider approves. Eating and drinking   Eat a balanced diet that includes fresh fruits and vegetables, whole grains, good sources of protein such as meat, eggs, or tofu,  and low-fat dairy. Your health care provider will help you determine the amount of weight gain that is right for you.  Avoid raw meat and uncooked cheese. These carry germs that can cause birth defects in the baby.  If you have low calcium intake from food, talk to your health care provider about whether you should take a daily calcium supplement.  Eat four or five small meals rather than three large meals a day.  Limit foods that are high in fat and processed sugars, such as fried and sweet foods.  To prevent constipation: ? Drink enough fluid to keep your urine clear or pale yellow. ? Eat foods that are high in fiber, such as fresh fruits and vegetables, whole grains, and beans. Activity  Exercise only as directed by your health care provider. Most women can continue their usual exercise routine during pregnancy. Try to exercise for 30 minutes at least 5 days a week. Stop exercising if you experience uterine contractions.  Avoid heavy lifting.  Do   not exercise in extreme heat or humidity, or at high altitudes.  Wear low-heel, comfortable shoes.  Practice good posture.  You may continue to have sex unless your health care provider tells you otherwise. Relieving pain and discomfort  Take frequent breaks and rest with your legs elevated if you have leg cramps or low back pain.  Take warm sitz baths to soothe any pain or discomfort caused by hemorrhoids. Use hemorrhoid cream if your health care provider approves.  Wear a good support bra to prevent discomfort from breast tenderness.  If you develop varicose veins: ? Wear support pantyhose or compression stockings as told by your healthcare provider. ? Elevate your feet for 15 minutes, 3-4 times a day. Prenatal care  Write down your questions. Take them to your prenatal visits.  Keep all your prenatal visits as told by your health care provider. This is important. Safety  Wear your seat belt at all times when driving.  Make  a list of emergency phone numbers, including numbers for family, friends, the hospital, and police and fire departments. General instructions  Avoid cat litter boxes and soil used by cats. These carry germs that can cause birth defects in the baby. If you have a cat, ask someone to clean the litter box for you.  Do not travel far distances unless it is absolutely necessary and only with the approval of your health care provider.  Do not use hot tubs, steam rooms, or saunas.  Do not drink alcohol.  Do not use any products that contain nicotine or tobacco, such as cigarettes and e-cigarettes. If you need help quitting, ask your health care provider.  Do not use any medicinal herbs or unprescribed drugs. These chemicals affect the formation and growth of the baby.  Do not douche or use tampons or scented sanitary pads.  Do not cross your legs for long periods of time.  To prepare for the arrival of your baby: ? Take prenatal classes to understand, practice, and ask questions about labor and delivery. ? Make a trial run to the hospital. ? Visit the hospital and tour the maternity area. ? Arrange for maternity or paternity leave through employers. ? Arrange for family and friends to take care of pets while you are in the hospital. ? Purchase a rear-facing car seat and make sure you know how to install it in your car. ? Pack your hospital bag. ? Prepare the baby's nursery. Make sure to remove all pillows and stuffed animals from the baby's crib to prevent suffocation.  Visit your dentist if you have not gone during your pregnancy. Use a soft toothbrush to brush your teeth and be gentle when you floss. Contact a health care provider if:  You are unsure if you are in labor or if your water has broken.  You become dizzy.  You have mild pelvic cramps, pelvic pressure, or nagging pain in your abdominal area.  You have lower back pain.  You have persistent nausea, vomiting, or  diarrhea.  You have an unusual or bad smelling vaginal discharge.  You have pain when you urinate. Get help right away if:  Your water breaks before 37 weeks.  You have regular contractions less than 5 minutes apart before 37 weeks.  You have a fever.  You are leaking fluid from your vagina.  You have spotting or bleeding from your vagina.  You have severe abdominal pain or cramping.  You have rapid weight loss or weight gain.  You have   shortness of breath with chest pain.  You notice sudden or extreme swelling of your face, hands, ankles, feet, or legs.  Your baby makes fewer than 10 movements in 2 hours.  You have severe headaches that do not go away when you take medicine.  You have vision changes. Summary  The third trimester is from week 28 through week 40, months 7 through 9. The third trimester is a time when the unborn baby (fetus) is growing rapidly.  During the third trimester, your discomfort may increase as you and your baby continue to gain weight. You may have abdominal, leg, and back pain, sleeping problems, and an increased need to urinate.  During the third trimester your breasts will keep growing and they will continue to become tender. A yellow fluid (colostrum) may leak from your breasts. This is the first milk you are producing for your baby.  False labor is a condition in which you feel small, irregular tightenings of the muscles in the womb (contractions) that eventually go away. These are called Braxton Hicks contractions. Contractions may last for hours, days, or even weeks before true labor sets in.  Signs of labor can include: abdominal cramps; regular contractions that start at 10 minutes apart and become stronger and more frequent with time; watery or bloody mucus discharge that comes from the vagina; increased pelvic pressure and dull back pain; and leaking of amniotic fluid. This information is not intended to replace advice given to you by your  health care provider. Make sure you discuss any questions you have with your health care provider. Document Released: 11/04/2001 Document Revised: 12/16/2016 Document Reviewed: 12/16/2016 Elsevier Interactive Patient Education  2019 [employer]  

## 2019-04-05 ENCOUNTER — Other Ambulatory Visit (HOSPITAL_COMMUNITY)
Admission: RE | Admit: 2019-04-05 | Discharge: 2019-04-05 | Disposition: A | Discharge status: Home / Self Care | Payer: Medicaid Other | Source: Ambulatory Visit | Attending: Obstetrics & Gynecology | Admitting: Obstetrics & Gynecology

## 2019-04-05 ENCOUNTER — Ambulatory Visit (INDEPENDENT_AMBULATORY_CARE_PROVIDER_SITE_OTHER): Payer: Medicaid Other | Admitting: Obstetrics & Gynecology

## 2019-04-05 ENCOUNTER — Encounter: Payer: Medicaid Other | Admitting: Advanced Practice Midwife

## 2019-04-05 ENCOUNTER — Other Ambulatory Visit: Payer: Self-pay

## 2019-04-05 VITALS — BP 119/79 | HR 96 | Wt 198.3 lb

## 2019-04-05 DIAGNOSIS — Z3483 Encounter for supervision of other normal pregnancy, third trimester: Secondary | ICD-10-CM

## 2019-04-05 DIAGNOSIS — Z3480 Encounter for supervision of other normal pregnancy, unspecified trimester: Secondary | ICD-10-CM | POA: Diagnosis present

## 2019-04-05 DIAGNOSIS — Z3A35 35 weeks gestation of pregnancy: Secondary | ICD-10-CM

## 2019-04-05 MED ORDER — FLUCONAZOLE 150 MG PO TABS: 150.0000 mg | ORAL_TABLET | Freq: Once | ORAL | 0 refills | Status: AC

## 2019-04-05 NOTE — Patient Instructions (Signed)

## 2019-04-05 NOTE — Progress Notes (Signed)
   PRENATAL VISIT NOTE  Subjective:  Alyssa [name] is a 23 y.o. G1P0000 at [redacted]w[redacted]d being seen today for ongoing prenatal care.  She is currently monitored for the following issues for this low-risk pregnancy and has Food allergy; Seasonal and perennial allergic rhinoconjunctivitis; Atopic dermatitis; Supervision of other normal pregnancy, antepartum; and Late prenatal care affecting pregnancy on their problem list.  Patient reports no complaints.  Contractions: Irritability. Vag. Bleeding: None.  Movement: Present. Denies leaking of fluid.   The following portions of the patient's history were reviewed and updated as appropriate: allergies, current medications, past family history, past medical history, past social history, past surgical history and problem list.   Objective:   Vitals:   04/05/19 1321  BP: 119/79  Pulse: 96  Weight: 198 lb 4.8 oz (89.9 kg)    Fetal Status: Fetal Heart Rate (bpm): 159   Movement: Present  Presentation: Vertex  General:  Alert, oriented and cooperative. Patient is in no acute distress.  Skin: Skin is warm and dry. No rash noted.   Cardiovascular: Normal heart rate noted  Respiratory: Normal respiratory effort, no problems with respiration noted  Abdomen: Soft, gravid, appropriate for gestational age.  Pain/Pressure: Present     Pelvic: Cervical exam performed Dilation: Closed Effacement (%): 40 Station: Ballotable  Extremities: Normal range of motion.  Edema: None  Mental Status: Normal mood and affect. Normal behavior. Normal judgment and thought content.   Assessment and Plan:  Pregnancy: G1P0000 at [redacted]w[redacted]d 1. Supervision of other normal pregnancy, antepartum Sx of yeast - Strep Gp B NAA - Cervicovaginal ancillary only( [facility]) - fluconazole (DIFLUCAN) 150 MG tablet; Take 1 tablet (150 mg total) by mouth once for 1 dose.  Dispense: 1 tablet; Refill: 0  Preterm labor symptoms and general obstetric precautions including but not limited  to vaginal bleeding, contractions, leaking of fluid and fetal movement were reviewed in detail with the patient. Please refer to After Visit Summary for other counseling recommendations.   Return in about 1 week (around 04/12/2019).  Future Appointments  Date Time Provider Department Center  04/08/2019  2:00 PM [facility] NURSE [facility]  04/08/2019  2:00 PM [facility] [place] 3 [facility]    Scheryl Darter, MD

## 2019-04-05 NOTE — Progress Notes (Signed)
ROB/GBS.  C/o malodorous discharge and itching.

## 2019-04-07 LAB — CERVICOVAGINAL ANCILLARY ONLY
Bacterial vaginitis: POSITIVE — AB
Candida vaginitis: POSITIVE — AB
Chlamydia: NEGATIVE
Neisseria Gonorrhea: NEGATIVE
Trichomonas: NEGATIVE

## 2019-04-07 LAB — STREP GP B NAA: Strep Gp B NAA: POSITIVE — AB

## 2019-04-08 ENCOUNTER — Ambulatory Visit (HOSPITAL_COMMUNITY)
Admission: RE | Admit: 2019-04-08 | Discharge: 2019-04-08 | Disposition: A | Discharge status: Home / Self Care | Payer: Medicaid Other | Source: Ambulatory Visit | Attending: Obstetrics and Gynecology | Admitting: Obstetrics and Gynecology

## 2019-04-08 ENCOUNTER — Ambulatory Visit (HOSPITAL_COMMUNITY): Payer: Medicaid Other | Admitting: *Deleted

## 2019-04-08 ENCOUNTER — Encounter (HOSPITAL_COMMUNITY): Payer: Self-pay

## 2019-04-08 ENCOUNTER — Other Ambulatory Visit: Payer: Self-pay

## 2019-04-08 VITALS — BP 113/70 | HR 101 | Temp 98.5°F

## 2019-04-08 DIAGNOSIS — Z362 Encounter for other antenatal screening follow-up: Secondary | ICD-10-CM | POA: Diagnosis not present

## 2019-04-08 DIAGNOSIS — Z3A36 36 weeks gestation of pregnancy: Secondary | ICD-10-CM

## 2019-04-08 DIAGNOSIS — O0930 Supervision of pregnancy with insufficient antenatal care, unspecified trimester: Secondary | ICD-10-CM | POA: Diagnosis present

## 2019-04-08 DIAGNOSIS — O365930 Maternal care for other known or suspected poor fetal growth, third trimester, not applicable or unspecified: Secondary | ICD-10-CM | POA: Diagnosis not present

## 2019-04-08 DIAGNOSIS — Z3480 Encounter for supervision of other normal pregnancy, unspecified trimester: Secondary | ICD-10-CM | POA: Insufficient documentation

## 2019-04-08 DIAGNOSIS — O0933 Supervision of pregnancy with insufficient antenatal care, third trimester: Secondary | ICD-10-CM

## 2019-04-11 ENCOUNTER — Other Ambulatory Visit (HOSPITAL_COMMUNITY): Payer: Self-pay | Admitting: Obstetrics & Gynecology

## 2019-04-11 ENCOUNTER — Other Ambulatory Visit (HOSPITAL_COMMUNITY): Payer: Self-pay | Admitting: *Deleted

## 2019-04-11 DIAGNOSIS — O365930 Maternal care for other known or suspected poor fetal growth, third trimester, not applicable or unspecified: Secondary | ICD-10-CM

## 2019-04-11 DIAGNOSIS — N898 Other specified noninflammatory disorders of vagina: Secondary | ICD-10-CM

## 2019-04-11 MED ORDER — METRONIDAZOLE 500 MG PO TABS: 500.0000 mg | ORAL_TABLET | Freq: Two times a day (BID) | ORAL | 0 refills | Status: AC

## 2019-04-11 MED ORDER — FLUCONAZOLE 100 MG PO TABS: 100.0000 mg | ORAL_TABLET | Freq: Every day | ORAL | 0 refills | Status: DC

## 2019-04-11 NOTE — Progress Notes (Signed)
BV and yeast treated

## 2019-04-12 ENCOUNTER — Other Ambulatory Visit: Payer: Self-pay

## 2019-04-12 ENCOUNTER — Ambulatory Visit (INDEPENDENT_AMBULATORY_CARE_PROVIDER_SITE_OTHER): Payer: Medicaid Other | Admitting: Obstetrics and Gynecology

## 2019-04-12 ENCOUNTER — Encounter: Payer: Self-pay | Admitting: Obstetrics and Gynecology

## 2019-04-12 DIAGNOSIS — Z3A36 36 weeks gestation of pregnancy: Secondary | ICD-10-CM

## 2019-04-12 DIAGNOSIS — Z3480 Encounter for supervision of other normal pregnancy, unspecified trimester: Secondary | ICD-10-CM

## 2019-04-12 DIAGNOSIS — O99820 Streptococcus B carrier state complicating pregnancy: Secondary | ICD-10-CM

## 2019-04-12 NOTE — Progress Notes (Signed)
Subjective:  Alyssa [name] is a 23 y.o. G1P0000 at [redacted]w[redacted]d being seen today for ongoing prenatal care.  She is currently monitored for the following issues for this high-risk pregnancy and has Food allergy; Seasonal and perennial allergic rhinoconjunctivitis; Atopic dermatitis; Supervision of other normal pregnancy, antepartum; Late prenatal care affecting pregnancy; and GBS (group B Streptococcus carrier), +RV culture, currently pregnant on their problem list.  Patient reports general discomforts of pregnancy.  Contractions: Not present. Vag. Bleeding: None.  Movement: Present. Denies leaking of fluid.   The following portions of the patient's history were reviewed and updated as appropriate: allergies, current medications, past family history, past medical history, past social history, past surgical history and problem list. Problem list updated.  Objective:   Vitals:   04/12/19 1424  BP: 128/84  Pulse: 85  Weight: 197 lb 9.6 oz (89.6 kg)    Fetal Status: Fetal Heart Rate (bpm): 135   Movement: Present     General:  Alert, oriented and cooperative. Patient is in no acute distress.  Skin: Skin is warm and dry. No rash noted.   Cardiovascular: Normal heart rate noted  Respiratory: Normal respiratory effort, no problems with respiration noted  Abdomen: Soft, gravid, appropriate for gestational age. Pain/Pressure: Present     Pelvic:  Cervical exam deferred        Extremities: Normal range of motion.  Edema: None  Mental Status: Normal mood and affect. Normal behavior. Normal judgment and thought content.   Urinalysis:      Assessment and Plan:  Pregnancy: G1P0000 at [redacted]w[redacted]d  1. Supervision of other normal pregnancy, antepartum Stable Labor precautions    U/S EFW 40 % , nl Dopplers, scheduled for weekly dopplers and repeat growth in 3-4 weeks  2. GBS (group B Streptococcus carrier), +RV culture, currently pregnant Tx while in labor  Term labor symptoms and general  obstetric precautions including but not limited to vaginal bleeding, contractions, leaking of fluid and fetal movement were reviewed in detail with the patient. Please refer to After Visit Summary for other counseling recommendations.  Return in about 1 week (around 04/19/2019) for OB visit, face to face.   Hermina Staggers, MD

## 2019-04-12 NOTE — Patient Instructions (Signed)
Third Trimester of Pregnancy The third trimester is from week 28 through week 40 (months 7 through 9). The third trimester is a time when the unborn baby (fetus) is growing rapidly. At the end of the ninth month, the fetus is about 20 inches in length and weighs 6-10 pounds. Body changes during your third trimester Your body will continue to go through many changes during pregnancy. The changes vary from woman to woman. During the third trimester:  Your weight will continue to increase. You can expect to gain 25-35 pounds (11-16 kg) by the end of the pregnancy.  You may begin to get stretch marks on your hips, abdomen, and breasts.  You may urinate more often because the fetus is moving lower into your pelvis and pressing on your bladder.  You may develop or continue to have heartburn. This is caused by increased hormones that slow down muscles in the digestive tract.  You may develop or continue to have constipation because increased hormones slow digestion and cause the muscles that push waste through your intestines to relax.  You may develop hemorrhoids. These are swollen veins (varicose veins) in the rectum that can itch or be painful.  You may develop swollen, bulging veins (varicose veins) in your legs.  You may have increased body aches in the pelvis, back, or thighs. This is due to weight gain and increased hormones that are relaxing your joints.  You may have changes in your hair. These can include thickening of your hair, rapid growth, and changes in texture. Some women also have hair loss during or after pregnancy, or hair that feels dry or thin. Your hair will most likely return to normal after your baby is born.  Your breasts will continue to grow and they will continue to become tender. A yellow fluid (colostrum) may leak from your breasts. This is the first milk you are producing for your baby.  Your belly button may stick out.  You may notice more swelling in your hands,  face, or ankles.  You may have increased tingling or numbness in your hands, arms, and legs. The skin on your belly may also feel numb.  You may feel short of breath because of your expanding uterus.  You may have more problems sleeping. This can be caused by the size of your belly, increased need to urinate, and an increase in your body's metabolism.  You may notice the fetus "dropping," or moving lower in your abdomen (lightening).  You may have increased vaginal discharge.  You may notice your joints feel loose and you may have pain around your pelvic bone. What to expect at prenatal visits You will have prenatal exams every 2 weeks until week 36. Then you will have weekly prenatal exams. During a routine prenatal visit:  You will be weighed to make sure you and the baby are growing normally.  Your blood pressure will be taken.  Your abdomen will be measured to track your baby's growth.  The fetal heartbeat will be listened to.  Any test results from the previous visit will be discussed.  You may have a cervical check near your due date to see if your cervix has softened or thinned (effaced).  You will be tested for Group B streptococcus. This happens between 35 and 37 weeks. Your health care provider may ask you:  What your birth plan is.  How you are feeling.  If you are feeling the baby move.  If you have had any abnormal   symptoms, such as leaking fluid, bleeding, severe headaches, or abdominal cramping.  If you are using any tobacco products, including cigarettes, chewing tobacco, and electronic cigarettes.  If you have any questions. Other tests or screenings that may be performed during your third trimester include:  Blood tests that check for low iron levels (anemia).  Fetal testing to check the health, activity level, and growth of the fetus. Testing is done if you have certain medical conditions or if there are problems during the pregnancy.  Nonstress test  (NST). This test checks the health of your baby to make sure there are no signs of problems, such as the baby not getting enough oxygen. During this test, a belt is placed around your belly. The baby is made to move, and its heart rate is monitored during movement. What is false labor? False labor is a condition in which you feel small, irregular tightenings of the muscles in the womb (contractions) that usually go away with rest, changing position, or drinking water. These are called Braxton Hicks contractions. Contractions may last for hours, days, or even weeks before true labor sets in. If contractions come at regular intervals, become more frequent, increase in intensity, or become painful, you should see your health care provider. What are the signs of labor?  Abdominal cramps.  Regular contractions that start at 10 minutes apart and become stronger and more frequent with time.  Contractions that start on the top of the uterus and spread down to the lower abdomen and back.  Increased pelvic pressure and dull back pain.  A watery or bloody mucus discharge that comes from the vagina.  Leaking of amniotic fluid. This is also known as your "water breaking." It could be a slow trickle or a gush. Let your health care provider know if it has a color or strange odor. If you have any of these signs, call your health care provider right away, even if it is before your due date. Follow these instructions at home: Medicines  Follow your health care provider's instructions regarding medicine use. Specific medicines may be either safe or unsafe to take during pregnancy.  Take a prenatal vitamin that contains at least 600 micrograms (mcg) of folic acid.  If you develop constipation, try taking a stool softener if your health care provider approves. Eating and drinking   Eat a balanced diet that includes fresh fruits and vegetables, whole grains, good sources of protein such as meat, eggs, or tofu,  and low-fat dairy. Your health care provider will help you determine the amount of weight gain that is right for you.  Avoid raw meat and uncooked cheese. These carry germs that can cause birth defects in the baby.  If you have low calcium intake from food, talk to your health care provider about whether you should take a daily calcium supplement.  Eat four or five small meals rather than three large meals a day.  Limit foods that are high in fat and processed sugars, such as fried and sweet foods.  To prevent constipation: ? Drink enough fluid to keep your urine clear or pale yellow. ? Eat foods that are high in fiber, such as fresh fruits and vegetables, whole grains, and beans. Activity  Exercise only as directed by your health care provider. Most women can continue their usual exercise routine during pregnancy. Try to exercise for 30 minutes at least 5 days a week. Stop exercising if you experience uterine contractions.  Avoid heavy lifting.  Do   not exercise in extreme heat or humidity, or at high altitudes.  Wear low-heel, comfortable shoes.  Practice good posture.  You may continue to have sex unless your health care provider tells you otherwise. Relieving pain and discomfort  Take frequent breaks and rest with your legs elevated if you have leg cramps or low back pain.  Take warm sitz baths to soothe any pain or discomfort caused by hemorrhoids. Use hemorrhoid cream if your health care provider approves.  Wear a good support bra to prevent discomfort from breast tenderness.  If you develop varicose veins: ? Wear support pantyhose or compression stockings as told by your healthcare provider. ? Elevate your feet for 15 minutes, 3-4 times a day. Prenatal care  Write down your questions. Take them to your prenatal visits.  Keep all your prenatal visits as told by your health care provider. This is important. Safety  Wear your seat belt at all times when driving.  Make  a list of emergency phone numbers, including numbers for family, friends, the hospital, and police and fire departments. General instructions  Avoid cat litter boxes and soil used by cats. These carry germs that can cause birth defects in the baby. If you have a cat, ask someone to clean the litter box for you.  Do not travel far distances unless it is absolutely necessary and only with the approval of your health care provider.  Do not use hot tubs, steam rooms, or saunas.  Do not drink alcohol.  Do not use any products that contain nicotine or tobacco, such as cigarettes and e-cigarettes. If you need help quitting, ask your health care provider.  Do not use any medicinal herbs or unprescribed drugs. These chemicals affect the formation and growth of the baby.  Do not douche or use tampons or scented sanitary pads.  Do not cross your legs for long periods of time.  To prepare for the arrival of your baby: ? Take prenatal classes to understand, practice, and ask questions about labor and delivery. ? Make a trial run to the hospital. ? Visit the hospital and tour the maternity area. ? Arrange for maternity or paternity leave through employers. ? Arrange for family and friends to take care of pets while you are in the hospital. ? Purchase a rear-facing car seat and make sure you know how to install it in your car. ? Pack your hospital bag. ? Prepare the baby's nursery. Make sure to remove all pillows and stuffed animals from the baby's crib to prevent suffocation.  Visit your dentist if you have not gone during your pregnancy. Use a soft toothbrush to brush your teeth and be gentle when you floss. Contact a health care provider if:  You are unsure if you are in labor or if your water has broken.  You become dizzy.  You have mild pelvic cramps, pelvic pressure, or nagging pain in your abdominal area.  You have lower back pain.  You have persistent nausea, vomiting, or  diarrhea.  You have an unusual or bad smelling vaginal discharge.  You have pain when you urinate. Get help right away if:  Your water breaks before 37 weeks.  You have regular contractions less than 5 minutes apart before 37 weeks.  You have a fever.  You are leaking fluid from your vagina.  You have spotting or bleeding from your vagina.  You have severe abdominal pain or cramping.  You have rapid weight loss or weight gain.  You have   shortness of breath with chest pain.  You notice sudden or extreme swelling of your face, hands, ankles, feet, or legs.  Your baby makes fewer than 10 movements in 2 hours.  You have severe headaches that do not go away when you take medicine.  You have vision changes. Summary  The third trimester is from week 28 through week 40, months 7 through 9. The third trimester is a time when the unborn baby (fetus) is growing rapidly.  During the third trimester, your discomfort may increase as you and your baby continue to gain weight. You may have abdominal, leg, and back pain, sleeping problems, and an increased need to urinate.  During the third trimester your breasts will keep growing and they will continue to become tender. A yellow fluid (colostrum) may leak from your breasts. This is the first milk you are producing for your baby.  False labor is a condition in which you feel small, irregular tightenings of the muscles in the womb (contractions) that eventually go away. These are called Braxton Hicks contractions. Contractions may last for hours, days, or even weeks before true labor sets in.  Signs of labor can include: abdominal cramps; regular contractions that start at 10 minutes apart and become stronger and more frequent with time; watery or bloody mucus discharge that comes from the vagina; increased pelvic pressure and dull back pain; and leaking of amniotic fluid. This information is not intended to replace advice given to you by your  health care provider. Make sure you discuss any questions you have with your health care provider. Document Released: 11/04/2001 Document Revised: 12/16/2016 Document Reviewed: 12/16/2016 Elsevier Interactive Patient Education  2019 [employer]  

## 2019-04-14 ENCOUNTER — Ambulatory Visit (HOSPITAL_COMMUNITY)
Admission: RE | Admit: 2019-04-14 | Discharge: 2019-04-14 | Disposition: A | Discharge status: Home / Self Care | Payer: Medicaid Other | Source: Ambulatory Visit | Attending: Obstetrics and Gynecology | Admitting: Obstetrics and Gynecology

## 2019-04-14 ENCOUNTER — Encounter (HOSPITAL_COMMUNITY): Payer: Self-pay | Admitting: *Deleted

## 2019-04-14 ENCOUNTER — Other Ambulatory Visit (HOSPITAL_COMMUNITY): Payer: Self-pay | Admitting: *Deleted

## 2019-04-14 ENCOUNTER — Other Ambulatory Visit: Payer: Self-pay

## 2019-04-14 ENCOUNTER — Ambulatory Visit (HOSPITAL_COMMUNITY): Payer: Medicaid Other | Admitting: *Deleted

## 2019-04-14 DIAGNOSIS — O0930 Supervision of pregnancy with insufficient antenatal care, unspecified trimester: Secondary | ICD-10-CM

## 2019-04-14 DIAGNOSIS — O365930 Maternal care for other known or suspected poor fetal growth, third trimester, not applicable or unspecified: Secondary | ICD-10-CM | POA: Diagnosis not present

## 2019-04-14 DIAGNOSIS — O0933 Supervision of pregnancy with insufficient antenatal care, third trimester: Secondary | ICD-10-CM | POA: Diagnosis not present

## 2019-04-14 DIAGNOSIS — Z3A37 37 weeks gestation of pregnancy: Secondary | ICD-10-CM

## 2019-04-14 DIAGNOSIS — Z3480 Encounter for supervision of other normal pregnancy, unspecified trimester: Secondary | ICD-10-CM | POA: Diagnosis present

## 2019-04-20 ENCOUNTER — Other Ambulatory Visit: Payer: Self-pay

## 2019-04-20 ENCOUNTER — Encounter: Payer: Self-pay | Admitting: Obstetrics and Gynecology

## 2019-04-20 ENCOUNTER — Ambulatory Visit (INDEPENDENT_AMBULATORY_CARE_PROVIDER_SITE_OTHER): Payer: Medicaid Other | Admitting: Obstetrics and Gynecology

## 2019-04-20 VITALS — BP 118/85 | HR 88 | Temp 98.3°F | Wt 201.0 lb

## 2019-04-20 DIAGNOSIS — O0933 Supervision of pregnancy with insufficient antenatal care, third trimester: Secondary | ICD-10-CM

## 2019-04-20 DIAGNOSIS — Z3A38 38 weeks gestation of pregnancy: Secondary | ICD-10-CM

## 2019-04-20 DIAGNOSIS — O0930 Supervision of pregnancy with insufficient antenatal care, unspecified trimester: Secondary | ICD-10-CM

## 2019-04-20 DIAGNOSIS — O99820 Streptococcus B carrier state complicating pregnancy: Secondary | ICD-10-CM

## 2019-04-20 DIAGNOSIS — Z3480 Encounter for supervision of other normal pregnancy, unspecified trimester: Secondary | ICD-10-CM

## 2019-04-20 NOTE — Progress Notes (Signed)
Pt noted bright red vaginal bleeding last week none since.  Pt request Cervix check.  Pt inquired about breast pump.

## 2019-04-20 NOTE — Progress Notes (Signed)
   PRENATAL VISIT NOTE  Subjective:  Alyssa [name] is a 23 y.o. G1P0000 at [redacted]w[redacted]d being seen today for ongoing prenatal care.  She is currently monitored for the following issues for this low-risk pregnancy and has Food allergy; Seasonal and perennial allergic rhinoconjunctivitis; Atopic dermatitis; Supervision of other normal pregnancy, antepartum; Late prenatal care affecting pregnancy; and GBS (group B Streptococcus carrier), +RV culture, currently pregnant on their problem list.  Patient reports no complaints.  Contractions: Irritability. Vag. Bleeding: None.  Movement: Present. Denies leaking of fluid.   The following portions of the patient's history were reviewed and updated as appropriate: allergies, current medications, past family history, past medical history, past social history, past surgical history and problem list.   Objective:   Vitals:   04/20/19 1414  BP: 118/85  Pulse: 88  Temp: 98.3 F (36.8 C)  Weight: 201 lb (91.2 kg)    Fetal Status: Fetal Heart Rate (bpm): 132 Fundal Height: 36 cm Movement: Present  Presentation: Vertex  General:  Alert, oriented and cooperative. Patient is in no acute distress.  Skin: Skin is warm and dry. No rash noted.   Cardiovascular: Normal heart rate noted  Respiratory: Normal respiratory effort, no problems with respiration noted  Abdomen: Soft, gravid, appropriate for gestational age.  Pain/Pressure: Present     Pelvic: Cervical exam performed Dilation: Closed Effacement (%): 40 Station: -3  Extremities: Normal range of motion.  Edema: Trace  Mental Status: Normal mood and affect. Normal behavior. Normal judgment and thought content.   Assessment and Plan:  Pregnancy: G1P0000 at [redacted]w[redacted]d 1. Supervision of other normal pregnancy, antepartum Patient is doing well without complaints Follow up ultrasound on 6/4  2. GBS (group B Streptococcus carrier), +RV culture, currently pregnant Prophylaxis in labor  3. Late prenatal  care affecting pregnancy, antepartum   Term labor symptoms and general obstetric precautions including but not limited to vaginal bleeding, contractions, leaking of fluid and fetal movement were reviewed in detail with the patient. Please refer to After Visit Summary for other counseling recommendations.   Return in about 1 week (around 04/27/2019) for [facility] [place].  Future Appointments  Date Time Provider Department Center  04/28/2019  2:45 PM [facility] NURSE [facility]  04/28/2019  2:45 PM [facility] [place] 5 [facility]    Catalina Antigua, MD

## 2019-04-21 ENCOUNTER — Ambulatory Visit (HOSPITAL_COMMUNITY): Payer: Medicaid Other

## 2019-04-27 ENCOUNTER — Encounter: Payer: Self-pay | Admitting: Obstetrics

## 2019-04-27 ENCOUNTER — Ambulatory Visit (INDEPENDENT_AMBULATORY_CARE_PROVIDER_SITE_OTHER): Payer: Medicaid Other | Admitting: Obstetrics

## 2019-04-27 DIAGNOSIS — Z3A39 39 weeks gestation of pregnancy: Secondary | ICD-10-CM

## 2019-04-27 DIAGNOSIS — O99820 Streptococcus B carrier state complicating pregnancy: Secondary | ICD-10-CM

## 2019-04-27 DIAGNOSIS — Z3480 Encounter for supervision of other normal pregnancy, unspecified trimester: Secondary | ICD-10-CM

## 2019-04-27 NOTE — Progress Notes (Signed)
[place] [facility]  She is not at home and does not have BP cuff with her.

## 2019-04-27 NOTE — Progress Notes (Signed)
TELEHEALTH OBSTETRICS PRENATAL VIRTUAL VIDEO VISIT ENCOUNTER NOTE  Provider [facility]   I connected with Regina [name] on 04/27/19 at  2:15 PM EDT by WebEx OB MyChart Video Encounter at home and verified that I am speaking with the correct person using two identifiers.   I discussed the limitations, risks, security and privacy concerns of performing an evaluation and management service by telephone and the availability of in person appointments. I also discussed with the patient that there may be a patient responsible charge related to this service. The patient expressed understanding and agreed to proceed. Subjective:  Alyssa [name] is a 23 y.o. G1P0000 at 4317w0d being seen today for ongoing prenatal care.  She is currently monitored for the following issues for this low-risk pregnancy and has Food allergy; Seasonal and perennial allergic rhinoconjunctivitis; Atopic dermatitis; Supervision of other normal pregnancy, antepartum; Late prenatal care affecting pregnancy; and GBS (group B Streptococcus carrier), +RV culture, currently pregnant on their problem list.  Patient reports no complaints.  Contractions: Irritability. Vag. Bleeding: None.  Movement: Present. Denies any leaking of fluid.   The following portions of the patient's history were reviewed and updated as appropriate: allergies, current medications, past family history, past medical history, past social history, past surgical history and problem list.   Objective:  There were no vitals filed for this visit.  Fetal Status:     Movement: Present     General:  Alert, oriented and cooperative. Patient is in no acute distress.  Respiratory: Normal respiratory effort, no problems with respiration noted  Mental Status: Normal mood and affect. Normal behavior. Normal judgment and thought content.  Rest of physical exam deferred due to type of encounter  Imaging: [place] Mfm  Fetal Bpp Wo Non Stress  Result Date: 04/14/2019 ----------------------------------------------------------------------  OBSTETRICS REPORT                       (Signed Final 04/14/2019 04:10 pm) ---------------------------------------------------------------------- Patient Info  ID #:       [id number]                          D.O.B.:  1996-10-08 (22 yrs)  Name:       Alyssa CancerAZARIA HARDRICK-                Visit Date: 04/14/2019 02:53 pm              CHAMBERS ---------------------------------------------------------------------- Performed By  Performed By:     Rennie PlowmanErica Lyskawa          Ref. Address:     [address]                    [address]                                                             [address], [place] [address]  [place], [place]                                                             [address]  Attending:        Noralee Space MD        [facility]  Referred By:      Brock Bad MD ---------------------------------------------------------------------- Orders   #  Description                          Code         Ordered By   1  [place] MFM FETAL BPP WO NON              76819.01     CORENTHIAN      STRESS                                            BOOKER   2  [place] MFM UA CORD DOPPLER               76820.02     Lin Landsman  ----------------------------------------------------------------------   #  Order #                    Accession #                 Episode #   1  [id number]                  [id number]                  [id number]   2  [id number]                  [id number]                  [id number]  ---------------------------------------------------------------------- Indications   [redacted] weeks gestation of pregnancy                Z3A.37   Insufficient Prenatal Care                     O09.30   Small  for gestational age fetus affecting      O36.5990   management of mother  ---------------------------------------------------------------------- Vital Signs  Weight (lb): 198                               Height:        5'2"  BMI:         36.21 ---------------------------------------------------------------------- Fetal Evaluation  Num Of Fetuses:         1  Fetal Heart Rate(bpm):  138  Cardiac Activity:       Observed  Presentation:           Cephalic  Placenta:               Anterior  P. Cord Insertion:      Visualized, central  Amniotic Fluid  AFI FV:      Within normal limits  AFI Sum(cm)     %Tile       Largest Pocket(cm)  11.94           39          6.22  RUQ(cm)       RLQ(cm)       LUQ(cm)        LLQ(cm)  3.09          2.63          6.22           0 ---------------------------------------------------------------------- Biophysical Evaluation  Amniotic F.V:   Within normal limits       F. Tone:        Observed  F. Movement:    Observed                   Score:          8/8  F. Breathing:   Observed ---------------------------------------------------------------------- OB History  Gravidity:    1         Term:   0        Prem:   0        SAB:   0  TOP:          0       Ectopic:  0        Living: 0 ---------------------------------------------------------------------- Gestational Age  LMP:           37w 1d        Date:  07/28/18                 EDD:   05/04/19  Best:          37w 1d     Det. By:  LMP  (07/28/18)          EDD:   05/04/19 ---------------------------------------------------------------------- Anatomy  Diaphragm:             Appears normal         Kidneys:                Appear normal  Stomach:               Appears normal, left   Bladder:                Appears normal                         sided ---------------------------------------------------------------------- Doppler - Fetal Vessels  Umbilical Artery   S/D     %tile     RI  PI                     ADFV    RDFV  2.33       50    0.57             0.83                         N       N ---------------------------------------------------------------------- Impression  Patient returned for BPP and Doppler studies. On previous  ultrasound, the abdominal circumference measurement was  at less than the 10th percentile. Umbilical artery Doppler  study was normal. Fetal growth (EFW) was appropriate for  gestational age.  On today's ultrasound, amniotic fluid is normal and good fetal  activity is seen. Antenatal testing is reassuring. BPP 8/8.  Umbilical artery Doppler showed normal forward diastolic flow.  I do not suspect fetal growth restriction on the basis of  smaller abdominal circumference alone. I reassured the  patient of the findings. ---------------------------------------------------------------------- Recommendations  -Fetal growth assessment in 2 weeks. ----------------------------------------------------------------------                  Noralee Space, MD Electronically Signed Final Report   04/14/2019 04:10 pm ----------------------------------------------------------------------  [place] Mfm Ob Follow Up  Result Date: 04/10/2019 ----------------------------------------------------------------------  OBSTETRICS REPORT                       (Signed Final 04/10/2019 05:16 pm) ---------------------------------------------------------------------- Patient Info  ID #:       [id number]                          D.O.B.:  1996-01-30 (22 yrs)  Name:       Alyssa Cancer-                Visit Date: 04/08/2019 02:04 pm              CHAMBERS ---------------------------------------------------------------------- Performed By  Performed By:     Sandi Mealy        Ref. Address:      [address]                                                              [place], [place]                                                              [address]  Attending:         Lin Landsman  [facility]                    MD                                        [facility]  Referred By:      Brock Bad MD ---------------------------------------------------------------------- Orders   #  Description                          Code         Ordered By   1  [place] MFM OB FOLLOW UP                  [phone]     [facility]  ----------------------------------------------------------------------   #  Order #                    Accession #                 Episode #   1  [id number]                  [id number]                  [id number]  ---------------------------------------------------------------------- Indications   Insufficient Prenatal Care                     O09.30   Small for gestational age fetus affecting      O58.5990   management of mother   Encounter for other antenatal screening        Z36.2   follow-up   [redacted] weeks gestation of pregnancy                Z3A.36  ---------------------------------------------------------------------- Vital Signs  Weight (lb): 198                               Height:        5'2"  BMI:         36.21 ---------------------------------------------------------------------- Fetal Evaluation  Num Of Fetuses:          1  Fetal Heart Rate(bpm):   153  Cardiac Activity:        Observed  Presentation:            Cephalic  Placenta:                Anterior  P. Cord Insertion:       Visualized  Amniotic Fluid  AFI FV:      Within normal limits  AFI Sum(cm)     %Tile       Largest Pocket(cm)  15.91           59          5.62  RUQ(cm)       RLQ(cm)       LUQ(cm)        LLQ(cm)  5.62          2.54          2.99  4.76 ---------------------------------------------------------------------- Biometry  BPD:      87.4  mm     G. Age:  35w 2d         32  %    CI:        74.66   %    70 - 86                                                          FL/HC:       22.6  %    20.1 - 22.1  HC:       321   mm     G.  Age:  36w 1d         21  %    HC/AC:       1.07       0.93 - 1.11  AC:      299.6  mm     G. Age:  34w 0d          7  %    FL/BPD:      83.2  %    71 - 87  FL:       72.7  mm     G. Age:  37w 2d         70  %    FL/AC:       24.3  %    20 - 24  HUM:      55.5  mm     G. Age:  32w 2d        < 5  %  Est. FW:    2626   gm   5 lb 13 oz      40  % ---------------------------------------------------------------------- OB History  Gravidity:    1         Term:   0        Prem:   0        SAB:   0  TOP:          0       Ectopic:  0        Living: 0 ---------------------------------------------------------------------- Gestational Age  LMP:           36w 2d        Date:  07/28/18                 EDD:   05/04/19  U/S Today:     35w 5d                                        EDD:   05/08/19  Best:          36w 2d     Det. By:  LMP  (07/28/18)          EDD:   05/04/19 ---------------------------------------------------------------------- Anatomy  Cranium:               Appears normal         Aortic Arch:            Previously seen  Cavum:  Appears normal         Ductal Arch:            Previously seen  Ventricles:            Previously seen        Diaphragm:              Appears normal  Choroid Plexus:        Previously seen        Stomach:                Appears normal, left                                                                        sided  Cerebellum:            Previously seen        Abdomen:                Previously seen  Posterior Fossa:       Previously seen        Abdominal Wall:         Appears nml (cord                                                                        insert, abd wall)  Nuchal Fold:           Not applicable (>20    Cord Vessels:           Previously seen                         wks GA)  Face:                  Orbits previously      Kidneys:                Appear normal                         seen  Lips:                  Previously seen        Bladder:                 Appears normal  Thoracic:              Previously seen        Spine:                  Not well visualized  Heart:                 Previously seen        Upper Extremities:      Previously seen  RVOT:                  Previously seen  Lower Extremities:      Previously seen  LVOT:                  Previously seen  Other:  Female gender. ---------------------------------------------------------------------- Doppler - Fetal Vessels  Umbilical Artery   S/D     %tile  2.52       57 ---------------------------------------------------------------------- Impression  Size less than dates on outside examination  Insufficient prenatal care  EFW 40% with AC at the 7th%, improved from prior exam.  UA Dopplers are within normal limits. ---------------------------------------------------------------------- Recommendations  Follow up growth scheduled in 3-4 weeks.  Repeat UA Dopplers in 1 week. ----------------------------------------------------------------------               Lin Landsman, MD Electronically Signed Final Report   04/10/2019 05:16 pm ----------------------------------------------------------------------  [place] Mfm Ua Cord Doppler  Result Date: 04/14/2019 ----------------------------------------------------------------------  OBSTETRICS REPORT                       (Signed Final 04/14/2019 04:10 pm) ---------------------------------------------------------------------- Patient Info  ID #:       [id number]                          D.O.B.:  May 17, 1996 (22 yrs)  Name:       Alyssa Cancer-                Visit Date: 04/14/2019 02:53 pm              CHAMBERS ---------------------------------------------------------------------- Performed By  Performed By:     Rennie Plowman          Ref. Address:     [address]                                                             [place], [place]                                                              [address]  Attending:        Noralee Space MD        [facility]  Referred By:  Brock Bad MD ---------------------------------------------------------------------- Orders   #  Description                          Code         Ordered By   1  [place] MFM FETAL BPP WO NON              76819.01     CORENTHIAN      STRESS                                            BOOKER   2  [place] MFM UA CORD DOPPLER               76820.02     Lin Landsman  ----------------------------------------------------------------------   #  Order #                    Accession #                 Episode #   1  [id number]                  [id number]                  [id number]   2  [id number]                  [id number]                  [id number]  ---------------------------------------------------------------------- Indications   [redacted] weeks gestation of pregnancy                Z3A.37   Insufficient Prenatal Care                     O09.30   Small for gestational age fetus affecting      O45.5990   management of mother  ---------------------------------------------------------------------- Vital Signs  Weight (lb): 198                               Height:        5'2"  BMI:         36.21 ---------------------------------------------------------------------- Fetal Evaluation  Num Of Fetuses:         1  Fetal Heart Rate(bpm):  138  Cardiac Activity:       Observed  Presentation:           Cephalic  Placenta:               Anterior  P. Cord Insertion:      Visualized, central  Amniotic Fluid  AFI FV:      Within normal limits  AFI Sum(cm)     %Tile       Largest Pocket(cm)  11.94  39          6.22  RUQ(cm)       RLQ(cm)       LUQ(cm)        LLQ(cm)  3.09          2.63          6.22           0  ---------------------------------------------------------------------- Biophysical Evaluation  Amniotic F.V:   Within normal limits       F. Tone:        Observed  F. Movement:    Observed                   Score:          8/8  F. Breathing:   Observed ---------------------------------------------------------------------- OB History  Gravidity:    1         Term:   0        Prem:   0        SAB:   0  TOP:          0       Ectopic:  0        Living: 0 ---------------------------------------------------------------------- Gestational Age  LMP:           37w 1d        Date:  07/28/18                 EDD:   05/04/19  Best:          37w 1d     Det. By:  LMP  (07/28/18)          EDD:   05/04/19 ---------------------------------------------------------------------- Anatomy  Diaphragm:             Appears normal         Kidneys:                Appear normal  Stomach:               Appears normal, left   Bladder:                Appears normal                         sided ---------------------------------------------------------------------- Doppler - Fetal Vessels  Umbilical Artery   S/D     %tile     RI              PI                     ADFV    RDFV  2.33       50   0.57             0.83                         N       N ---------------------------------------------------------------------- Impression  Patient returned for BPP and Doppler studies. On previous  ultrasound, the abdominal circumference measurement was  at less than the 10th percentile. Umbilical artery Doppler  study was normal. Fetal growth (EFW) was appropriate for  gestational age.  On today's ultrasound, amniotic fluid is normal and good fetal  activity is seen. Antenatal testing is reassuring. BPP 8/8.  Umbilical artery Doppler showed normal forward diastolic flow.  I do not suspect fetal growth restriction on  the basis of  smaller abdominal circumference alone. I reassured the  patient of the findings.  ---------------------------------------------------------------------- Recommendations  -Fetal growth assessment in 2 weeks. ----------------------------------------------------------------------                  Noralee Space, MD Electronically Signed Final Report   04/14/2019 04:10 pm ----------------------------------------------------------------------   Assessment and Plan:  Pregnancy: G1P0000 at [redacted]w[redacted]d 1. Supervision of other normal pregnancy, antepartum  2. GBS (group B Streptococcus carrier), +RV culture, currently pregnant - treat in labor   Term labor symptoms and general obstetric precautions including but not limited to vaginal bleeding, contractions, leaking of fluid and fetal movement were reviewed in detail with the patient. I discussed the assessment and treatment plan with the patient. The patient was provided an opportunity to ask questions and all were answered. The patient agreed with the plan and demonstrated an understanding of the instructions. The patient was advised to call back or seek an in-person office evaluation/go to MAU at [facility] for any urgent or concerning symptoms. Please refer to After Visit Summary for other counseling recommendations.   I provided 10 minutes of face-to-face time during this encounter.  Return in about 1 week (around 05/04/2019) for [facility].  Future Appointments  Date Time Provider Department Center  04/28/2019  2:45 PM [facility]-M[facility] NURSE [facility]-US  04/28/2019  2:45 PM WH-MFC [place] 5 WH-MFCUS MFC-US    Coral Ceo, MD Center for [facility], [facility] 04-27-2019

## 2019-04-28 ENCOUNTER — Encounter (HOSPITAL_COMMUNITY): Payer: Self-pay

## 2019-04-28 ENCOUNTER — Other Ambulatory Visit: Payer: Self-pay

## 2019-04-28 ENCOUNTER — Ambulatory Visit (HOSPITAL_COMMUNITY): Payer: Medicaid Other | Admitting: *Deleted

## 2019-04-28 ENCOUNTER — Other Ambulatory Visit (HOSPITAL_COMMUNITY): Payer: Self-pay | Admitting: Maternal & Fetal Medicine

## 2019-04-28 ENCOUNTER — Ambulatory Visit (HOSPITAL_BASED_OUTPATIENT_CLINIC_OR_DEPARTMENT_OTHER)
Admission: RE | Admit: 2019-04-28 | Discharge: 2019-04-28 | Disposition: A | Discharge status: Home / Self Care | Payer: Medicaid Other | Source: Ambulatory Visit | Attending: Obstetrics and Gynecology | Admitting: Obstetrics and Gynecology

## 2019-04-28 ENCOUNTER — Telehealth (HOSPITAL_COMMUNITY): Payer: Self-pay | Admitting: *Deleted

## 2019-04-28 ENCOUNTER — Inpatient Hospital Stay (HOSPITAL_COMMUNITY)
Admission: AD | Admit: 2019-04-28 | Discharge: 2019-05-02 | DRG: 807 | Disposition: A | Discharge status: Home / Self Care | Payer: Medicaid Other | Source: Ambulatory Visit | Attending: Obstetrics and Gynecology | Admitting: Obstetrics and Gynecology

## 2019-04-28 VITALS — BP 135/89 | HR 74 | Temp 98.4°F

## 2019-04-28 DIAGNOSIS — Z3A29 29 weeks gestation of pregnancy: Secondary | ICD-10-CM | POA: Diagnosis not present

## 2019-04-28 DIAGNOSIS — D649 Anemia, unspecified: Secondary | ICD-10-CM | POA: Diagnosis present

## 2019-04-28 DIAGNOSIS — F39 Unspecified mood [affective] disorder: Secondary | ICD-10-CM | POA: Diagnosis present

## 2019-04-28 DIAGNOSIS — O99344 Other mental disorders complicating childbirth: Secondary | ICD-10-CM | POA: Diagnosis present

## 2019-04-28 DIAGNOSIS — O99013 Anemia complicating pregnancy, third trimester: Secondary | ICD-10-CM | POA: Diagnosis present

## 2019-04-28 DIAGNOSIS — O99820 Streptococcus B carrier state complicating pregnancy: Secondary | ICD-10-CM

## 2019-04-28 DIAGNOSIS — O99824 Streptococcus B carrier state complicating childbirth: Secondary | ICD-10-CM | POA: Diagnosis present

## 2019-04-28 DIAGNOSIS — O0930 Supervision of pregnancy with insufficient antenatal care, unspecified trimester: Secondary | ICD-10-CM | POA: Insufficient documentation

## 2019-04-28 DIAGNOSIS — O1414 Severe pre-eclampsia complicating childbirth: Principal | ICD-10-CM | POA: Diagnosis present

## 2019-04-28 DIAGNOSIS — Z362 Encounter for other antenatal screening follow-up: Secondary | ICD-10-CM

## 2019-04-28 DIAGNOSIS — Z3480 Encounter for supervision of other normal pregnancy, unspecified trimester: Secondary | ICD-10-CM | POA: Insufficient documentation

## 2019-04-28 DIAGNOSIS — Z1159 Encounter for screening for other viral diseases: Secondary | ICD-10-CM

## 2019-04-28 DIAGNOSIS — O1413 Severe pre-eclampsia, third trimester: Secondary | ICD-10-CM | POA: Diagnosis present

## 2019-04-28 DIAGNOSIS — O365930 Maternal care for other known or suspected poor fetal growth, third trimester, not applicable or unspecified: Secondary | ICD-10-CM | POA: Diagnosis not present

## 2019-04-28 DIAGNOSIS — Z3A39 39 weeks gestation of pregnancy: Secondary | ICD-10-CM

## 2019-04-28 DIAGNOSIS — O9902 Anemia complicating childbirth: Secondary | ICD-10-CM | POA: Diagnosis present

## 2019-04-28 DIAGNOSIS — O0990 Supervision of high risk pregnancy, unspecified, unspecified trimester: Secondary | ICD-10-CM

## 2019-04-28 DIAGNOSIS — O99213 Obesity complicating pregnancy, third trimester: Secondary | ICD-10-CM

## 2019-04-28 DIAGNOSIS — O0933 Supervision of pregnancy with insufficient antenatal care, third trimester: Secondary | ICD-10-CM

## 2019-04-28 DIAGNOSIS — O365990 Maternal care for other known or suspected poor fetal growth, unspecified trimester, not applicable or unspecified: Secondary | ICD-10-CM | POA: Diagnosis present

## 2019-04-28 MED ORDER — LABETALOL HCL 5 MG/ML IV SOLN
20.0000 mg | INTRAVENOUS | Status: DC | PRN
  Administered 2019-04-29: 20 mg via INTRAVENOUS
  Filled 2019-04-28: qty 4

## 2019-04-28 MED ORDER — LABETALOL HCL 5 MG/ML IV SOLN
40.0000 mg | INTRAVENOUS | Status: DC | PRN
  Administered 2019-04-29: 02:00:00 40 mg via INTRAVENOUS
  Filled 2019-04-28: qty 8

## 2019-04-28 MED ORDER — HYDRALAZINE HCL 20 MG/ML IJ SOLN: 10.0000 mg | INTRAMUSCULAR | Status: DC | PRN

## 2019-04-28 MED ORDER — LABETALOL HCL 5 MG/ML IV SOLN: 80.0000 mg | INTRAVENOUS | Status: DC | PRN

## 2019-04-28 NOTE — MAU Note (Addendum)
BP was 135/89 and they wanted to induce me.  They said induction might be on Sunday or Monday. Then I checked blood pressure at hourly, bp was  153/117 when I left to come here. Headache all day. No bleeding. No leaking. Baby moving well. Contractions but haven't been timing them.

## 2019-04-28 NOTE — Telephone Encounter (Signed)
Preadmission screen  

## 2019-04-28 NOTE — Procedures (Signed)
Shea Hardrick-Chambers Dec 26, 1995 [redacted]w[redacted]d  Fetus A Non-Stress Test Interpretation for 04/28/19  Indication: IUGR  Fetal Heart Rate A Mode: External Baseline Rate (A): 140 bpm Variability: Moderate Accelerations: 15 x 15 Decelerations: None Multiple birth?: No  Uterine Activity Mode: Toco Contraction Frequency (min): one UC noted Contraction Duration (sec): 50 Contraction Quality: Mild Resting Tone Palpated: Relaxed Resting Time: Adequate  Interpretation (Fetal Testing) Nonstress Test Interpretation: Reactive Comments: FHR tracing rev'd by Dr. Judeth Cornfield

## 2019-04-29 ENCOUNTER — Encounter (HOSPITAL_COMMUNITY): Payer: Self-pay

## 2019-04-29 ENCOUNTER — Other Ambulatory Visit: Payer: Self-pay

## 2019-04-29 DIAGNOSIS — Z1159 Encounter for screening for other viral diseases: Secondary | ICD-10-CM | POA: Diagnosis not present

## 2019-04-29 DIAGNOSIS — Z3A39 39 weeks gestation of pregnancy: Secondary | ICD-10-CM

## 2019-04-29 DIAGNOSIS — O99824 Streptococcus B carrier state complicating childbirth: Secondary | ICD-10-CM

## 2019-04-29 DIAGNOSIS — O1414 Severe pre-eclampsia complicating childbirth: Secondary | ICD-10-CM | POA: Diagnosis present

## 2019-04-29 DIAGNOSIS — O1413 Severe pre-eclampsia, third trimester: Secondary | ICD-10-CM | POA: Diagnosis present

## 2019-04-29 DIAGNOSIS — O99344 Other mental disorders complicating childbirth: Secondary | ICD-10-CM | POA: Diagnosis present

## 2019-04-29 DIAGNOSIS — O9902 Anemia complicating childbirth: Secondary | ICD-10-CM | POA: Diagnosis present

## 2019-04-29 DIAGNOSIS — F39 Unspecified mood [affective] disorder: Secondary | ICD-10-CM | POA: Diagnosis present

## 2019-04-29 DIAGNOSIS — D649 Anemia, unspecified: Secondary | ICD-10-CM | POA: Diagnosis present

## 2019-04-29 LAB — CBC
HCT: 34 % — ABNORMAL LOW (ref 36.0–46.0)
HCT: 35.7 % — ABNORMAL LOW (ref 36.0–46.0)
Hemoglobin: 10.8 g/dL — ABNORMAL LOW (ref 12.0–15.0)
Hemoglobin: 11.4 g/dL — ABNORMAL LOW (ref 12.0–15.0)
MCH: 25.4 pg — ABNORMAL LOW (ref 26.0–34.0)
MCH: 25.5 pg — ABNORMAL LOW (ref 26.0–34.0)
MCHC: 31.8 g/dL (ref 30.0–36.0)
MCHC: 31.9 g/dL (ref 30.0–36.0)
MCV: 79.5 fL — ABNORMAL LOW (ref 80.0–100.0)
MCV: 80.4 fL (ref 80.0–100.0)
Platelets: 248 10*3/uL (ref 150–400)
Platelets: 253 10*3/uL (ref 150–400)
RBC: 4.23 MIL/uL (ref 3.87–5.11)
RBC: 4.49 MIL/uL (ref 3.87–5.11)
RDW: 15.1 % (ref 11.5–15.5)
RDW: 15.1 % (ref 11.5–15.5)
WBC: 16.1 10*3/uL — ABNORMAL HIGH (ref 4.0–10.5)
WBC: 19.5 10*3/uL — ABNORMAL HIGH (ref 4.0–10.5)
nRBC: 0 % (ref 0.0–0.2)
nRBC: 0 % (ref 0.0–0.2)

## 2019-04-29 LAB — URINALYSIS, ROUTINE W REFLEX MICROSCOPIC
Bilirubin Urine: NEGATIVE
Glucose, UA: NEGATIVE mg/dL
Hgb urine dipstick: NEGATIVE
Ketones, ur: NEGATIVE mg/dL
Nitrite: NEGATIVE
Protein, ur: 100 mg/dL — AB
Specific Gravity, Urine: 1.018 (ref 1.005–1.030)
pH: 6 (ref 5.0–8.0)

## 2019-04-29 LAB — COMPREHENSIVE METABOLIC PANEL
ALT: 37 U/L (ref 0–44)
AST: 30 U/L (ref 15–41)
Albumin: 2.7 g/dL — ABNORMAL LOW (ref 3.5–5.0)
Alkaline Phosphatase: 141 U/L — ABNORMAL HIGH (ref 38–126)
Anion gap: 10 (ref 5–15)
BUN: 5 mg/dL — ABNORMAL LOW (ref 6–20)
CO2: 18 mmol/L — ABNORMAL LOW (ref 22–32)
Calcium: 8.6 mg/dL — ABNORMAL LOW (ref 8.9–10.3)
Chloride: 109 mmol/L (ref 98–111)
Creatinine, Ser: 0.62 mg/dL (ref 0.44–1.00)
GFR calc Af Amer: >60 mL/min (ref >60)
GFR calc non Af Amer: >60 mL/min (ref >60)
Glucose, Bld: 107 mg/dL — ABNORMAL HIGH (ref 70–99)
Potassium: 3.9 mmol/L (ref 3.5–5.1)
Sodium: 137 mmol/L (ref 135–145)
Total Bilirubin: 0.1 mg/dL — ABNORMAL LOW (ref 0.3–1.2)
Total Protein: 6.2 g/dL — ABNORMAL LOW (ref 6.5–8.1)

## 2019-04-29 LAB — TYPE AND SCREEN
ABO/RH(D): AB POS
Antibody Screen: NEGATIVE

## 2019-04-29 LAB — PROTEIN / CREATININE RATIO, URINE
Creatinine, Urine: 148.67 mg/dL
Protein Creatinine Ratio: 0.94 mg/mg{Cre} — ABNORMAL HIGH (ref 0.00–0.15)
Total Protein, Urine: 140 mg/dL

## 2019-04-29 LAB — SARS CORONAVIRUS 2: SARS Coronavirus 2: NOT DETECTED

## 2019-04-29 LAB — RPR: RPR Ser Ql: NONREACTIVE

## 2019-04-29 LAB — ABO/RH: ABO/RH(D): AB POS

## 2019-04-29 MED ORDER — LACTATED RINGERS IV SOLN
500.0000 mL | Freq: Once | INTRAVENOUS | Status: AC
  Administered 2019-04-29: 500 mL via INTRAVENOUS

## 2019-04-29 MED ORDER — EPHEDRINE 5 MG/ML INJ: 10.0000 mg | INTRAVENOUS | Status: DC | PRN

## 2019-04-29 MED ORDER — PHENYLEPHRINE 40 MCG/ML (10ML) SYRINGE FOR IV PUSH (FOR BLOOD PRESSURE SUPPORT): 80.0000 ug | PREFILLED_SYRINGE | INTRAVENOUS | Status: DC | PRN

## 2019-04-29 MED ORDER — MISOPROSTOL 200 MCG PO TABS
ORAL_TABLET | ORAL | Status: AC
  Filled 2019-04-29: qty 4

## 2019-04-29 MED ORDER — OXYTOCIN BOLUS FROM INFUSION
500.0000 mL | Freq: Once | INTRAVENOUS | Status: AC
  Administered 2019-04-29: 500 mL via INTRAVENOUS

## 2019-04-29 MED ORDER — LACTATED RINGERS IV SOLN
INTRAVENOUS | Status: DC
  Administered 2019-04-29 (×3): via INTRAVENOUS

## 2019-04-29 MED ORDER — OXYCODONE-ACETAMINOPHEN 5-325 MG PO TABS: 2.0000 | ORAL_TABLET | ORAL | Status: DC | PRN

## 2019-04-29 MED ORDER — MISOPROSTOL 50MCG HALF TABLET
50.0000 ug | ORAL_TABLET | ORAL | Status: DC | PRN
  Administered 2019-04-29 (×3): 50 ug via ORAL
  Filled 2019-04-29 (×3): qty 1

## 2019-04-29 MED ORDER — TERBUTALINE SULFATE 1 MG/ML IJ SOLN: 0.2500 mg | Freq: Once | INTRAMUSCULAR | Status: DC | PRN

## 2019-04-29 MED ORDER — OXYTOCIN 40 UNITS IN NORMAL SALINE INFUSION - SIMPLE MED: 2.5000 [IU]/h | INTRAVENOUS | Status: DC

## 2019-04-29 MED ORDER — MISOPROSTOL 25 MCG QUARTER TABLET
ORAL_TABLET | ORAL | Status: AC
  Filled 2019-04-29: qty 1

## 2019-04-29 MED ORDER — MAGNESIUM SULFATE 40 G IN LACTATED RINGERS - SIMPLE
2.0000 g/h | INTRAVENOUS | Status: DC
  Administered 2019-04-29 (×2): 2 g/h via INTRAVENOUS
  Filled 2019-04-29 (×2): qty 500

## 2019-04-29 MED ORDER — OXYCODONE-ACETAMINOPHEN 5-325 MG PO TABS: 1.0000 | ORAL_TABLET | ORAL | Status: DC | PRN

## 2019-04-29 MED ORDER — FENTANYL-BUPIVACAINE-NACL 0.5-0.125-0.9 MG/250ML-% EP SOLN
12.0000 mL/h | EPIDURAL | Status: DC | PRN
  Filled 2019-04-29: qty 250

## 2019-04-29 MED ORDER — MAGNESIUM SULFATE BOLUS VIA INFUSION
4.0000 g | Freq: Once | INTRAVENOUS | Status: AC
  Administered 2019-04-29: 4 g via INTRAVENOUS
  Filled 2019-04-29: qty 500

## 2019-04-29 MED ORDER — LABETALOL HCL 200 MG PO TABS
200.0000 mg | ORAL_TABLET | Freq: Two times a day (BID) | ORAL | Status: DC
  Administered 2019-04-29 (×2): 200 mg via ORAL
  Filled 2019-04-29 (×2): qty 1

## 2019-04-29 MED ORDER — LACTATED RINGERS IV SOLN: 500.0000 mL | INTRAVENOUS | Status: DC | PRN

## 2019-04-29 MED ORDER — ONDANSETRON HCL 4 MG/2ML IJ SOLN
4.0000 mg | Freq: Four times a day (QID) | INTRAMUSCULAR | Status: DC | PRN
  Administered 2019-04-29: 4 mg via INTRAVENOUS
  Filled 2019-04-29: qty 2

## 2019-04-29 MED ORDER — ACETAMINOPHEN 325 MG PO TABS: 650.0000 mg | ORAL_TABLET | ORAL | Status: DC | PRN

## 2019-04-29 MED ORDER — OXYTOCIN 40 UNITS IN NORMAL SALINE INFUSION - SIMPLE MED
1.0000 m[IU]/min | INTRAVENOUS | Status: DC
  Administered 2019-04-29: 2 m[IU]/min via INTRAVENOUS
  Filled 2019-04-29: qty 1000

## 2019-04-29 MED ORDER — LIDOCAINE HCL (PF) 1 % IJ SOLN
30.0000 mL | INTRAMUSCULAR | Status: DC | PRN
  Filled 2019-04-29: qty 30

## 2019-04-29 MED ORDER — SODIUM CHLORIDE 0.9 % IV SOLN
5.0000 10*6.[IU] | Freq: Once | INTRAVENOUS | Status: AC
  Administered 2019-04-29: 5 10*6.[IU] via INTRAVENOUS
  Filled 2019-04-29: qty 5

## 2019-04-29 MED ORDER — SOD CITRATE-CITRIC ACID 500-334 MG/5ML PO SOLN: 30.0000 mL | ORAL | Status: DC | PRN

## 2019-04-29 MED ORDER — PENICILLIN G 3 MILLION UNITS IVPB - SIMPLE MED
3.0000 10*6.[IU] | INTRAVENOUS | Status: DC
  Administered 2019-04-29 (×5): 3 10*6.[IU] via INTRAVENOUS
  Filled 2019-04-29 (×5): qty 100

## 2019-04-29 MED ORDER — FENTANYL CITRATE (PF) 100 MCG/2ML IJ SOLN
100.0000 ug | INTRAMUSCULAR | Status: DC | PRN
  Administered 2019-04-29 (×2): 100 ug via INTRAVENOUS
  Filled 2019-04-29 (×2): qty 2

## 2019-04-29 MED ORDER — DIPHENHYDRAMINE HCL 50 MG/ML IJ SOLN: 12.5000 mg | INTRAMUSCULAR | Status: DC | PRN

## 2019-04-29 NOTE — H&P (Addendum)
OBSTETRIC ADMISSION HISTORY AND PHYSICAL  Alyssa [name] is a 23 y.o. female G1P0000 with IUP at 48w2dby LMP and early ultrasound presenting for IOL for preeclampsia. Pt reports increasing BPs today after her visit in the office and became worried so she went to MAU. Pt denies HA, vision changes, N/V, or RUQ pain. Pt denies LOF or vaginal bleeding. Reports good fetal movement    [facility]  Support person in labor: FOB-Sharif  Ultrasounds . Anatomy U/S: normal, but limited due to advanced gestational age  Prenatal History/Complications: . Late to care . GBS positive . Fetal growth restriction . Severe preeclampsia  Past Medical History: Past Medical History:  Diagnosis Date  . ADHD (attention deficit hyperactivity disorder)    ADHD  . Angio-edema   . Asthma   . Eczema   . Recurrent upper respiratory infection (URI)   . Urticaria     Past Surgical History: Past Surgical History:  Procedure Laterality Date  . WISDOM TOOTH EXTRACTION    . WISDOM TOOTH EXTRACTION      Obstetrical History: OB History    Gravida  1   Para  0   Term  0   Preterm  0   AB  0   Living  0     SAB  0   TAB  0   Ectopic  0   Multiple  0   Live Births  0           Social History: Social History   Socioeconomic History  . Marital status: Single    Spouse name: Not on file  . Number of children: Not on file  . Years of education: Not on file  . Highest education level: Not on file  Occupational History  . Not on file  Social Needs  . Financial resource strain: Not on file  . Food insecurity:    Worry: Not on file    Inability: Not on file  . Transportation needs:    Medical: Not on file    Non-medical: Not on file  Tobacco Use  . Smoking status: Never Smoker  . Smokeless tobacco: Never Used  Substance and Sexual Activity  . Alcohol use: No  . Drug use: Not Currently    Types: Marijuana    Comment: last used 12/19  .  Sexual activity: Yes    Birth control/protection: None    Comment: last IC-a week ago   Lifestyle  . Physical activity:    Days per week: Not on file    Minutes per session: Not on file  . Stress: Not on file  Relationships  . Social connections:    Talks on phone: Not on file    Gets together: Not on file    Attends religious service: Not on file    Active member of club or organization: Not on file    Attends meetings of clubs or organizations: Not on file    Relationship status: Not on file  Other Topics Concern  . Not on file  Social History Narrative  . Not on file    Family History: Family History  Problem Relation Age of Onset  . Asthma Mother   . Eczema Mother   . Allergic rhinitis Mother   . Angioedema Mother   . Asthma Brother   . Asthma Brother   . Allergic rhinitis Brother   . Urticaria Neg Hx   . Immunodeficiency Neg Hx  Allergies: Allergies  Allergen Reactions  . Shellfish Allergy Hives    "Throat closes"  . Banana Itching and Swelling  . Peach Flavor Itching    Medications Prior to Admission  Medication Sig Dispense Refill Last Dose  . Prenatal Vit-Fe Fumarate-FA (MULTIVITAMIN-PRENATAL) 27-0.8 MG TABS tablet Take 1 tablet by mouth daily at 12 noon.   04/28/2019 at Unknown time  . albuterol (PROAIR HFA) 108 (90 Base) MCG/ACT inhaler TWO PUFFS EVERY 4-6 HOURS AS NEEDED FOR COUGH OR WHEEZE. (Patient not taking: Reported on 04/27/2019) 1 Inhaler 6 Not Taking  . albuterol (PROVENTIL) (2.5 MG/3ML) 0.083% nebulizer solution Take 3 mLs (2.5 mg total) by nebulization every 6 (six) hours as needed for wheezing or shortness of breath. (Patient not taking: Reported on 02/21/2019) 75 mL 12 Not Taking  . EPINEPHrine 0.3 mg/0.3 mL IJ SOAJ injection Inject 0.3 mLs (0.3 mg total) into the muscle once. (Patient not taking: Reported on 02/21/2019) 2 Device 2 Not Taking  . fluconazole (DIFLUCAN) 100 MG tablet Take 1 tablet (100 mg total) by mouth daily for 30 days. (Patient  not taking: Reported on 04/12/2019) 30 tablet 0 Not Taking  . glycopyrrolate (ROBINUL) 1 MG tablet Take 1 tablet (1 mg total) by mouth 3 (three) times daily. For spitting (Patient not taking: Reported on 01/27/2019) 30 tablet 1 Not Taking  . ondansetron (ZOFRAN ODT) 4 MG disintegrating tablet Take 1 tablet (4 mg total) by mouth every 8 (eight) hours as needed for nausea or vomiting. (Patient not taking: Reported on 01/27/2019) 20 tablet 0 Not Taking  . potassium chloride (KLOR-CON) 20 MEQ packet Take 20 mEq by mouth 2 (two) times daily for 3 days. 6 packet 0   . promethazine (PHENERGAN) 25 MG tablet Take 1 tablet (25 mg total) by mouth every 6 (six) hours as needed for nausea or vomiting. (Patient not taking: Reported on 01/27/2019) 30 tablet 0 Not Taking  . tinidazole (TINDAMAX) 500 MG tablet Take 2 tablets (1,000 mg total) by mouth daily with breakfast. (Patient not taking: Reported on 02/21/2019) 10 tablet 2 Not Taking     Review of Systems  All systems reviewed and negative except as stated in HPI  Blood pressure (!) 144/101, pulse 81, temperature 98.3 F (36.8 C), temperature source Oral, resp. rate 16, last menstrual period 07/28/2018, SpO2 98 %. General appearance: alert, cooperative and appears stated age, appears anxious Lungs: no respiratory distress Heart: regular rate  Abdomen: soft, non-tender; gravid  Extremities: Homans sign is negative, no sign of DVT Presentation: vertex Fetal monitoring: continuous; FHR 125; moderate variability, +accels, no decels Uterine activity: toco; contracting q 3-5 mins Dilation: Fingertip Effacement (%): 70 Station: -2 Exam by:: Carmelia Roller cnm  Prenatal labs: ABO, Rh: --/--/AB POS (06/05 0021) Antibody: NEG (06/05 0021) Rubella: 14.50 (03/05 1345) RPR: Non Reactive (03/30 1026)  HBsAg: Negative (03/05 1345)  HIV: Non Reactive (03/30 1026)  GBS: Positive (05/12 1357)  Glucola: normal Genetic screening: CF: negative  Prenatal Transfer Tool   Maternal Diabetes: No Genetic Screening: Normal Maternal Ultrasounds/Referrals: Normal Fetal Ultrasounds or other Referrals:  None Maternal Substance Abuse:  No Significant Maternal Medications:  None Significant Maternal Lab Results: Lab values include: Group B Strep positive  Results for orders placed or performed during the hospital encounter of 04/28/19 (from the past 24 hour(s))  Urinalysis, Routine w reflex microscopic   Collection Time: 04/28/19 11:21 PM  Result Value Ref Range   Color, Urine YELLOW YELLOW   APPearance HAZY (A) CLEAR  Specific Gravity, Urine 1.018 1.005 - 1.030   pH 6.0 5.0 - 8.0   Glucose, UA NEGATIVE NEGATIVE mg/dL   Hgb urine dipstick NEGATIVE NEGATIVE   Bilirubin Urine NEGATIVE NEGATIVE   Ketones, ur NEGATIVE NEGATIVE mg/dL   Protein, ur 100 (A) NEGATIVE mg/dL   Nitrite NEGATIVE NEGATIVE   Leukocytes,Ua LARGE (A) NEGATIVE   RBC / HPF 6-10 0 - 5 RBC/hpf   WBC, UA 11-20 0 - 5 WBC/hpf   Bacteria, UA RARE (A) NONE SEEN   Squamous Epithelial / LPF 11-20 0 - 5   Mucus PRESENT   Protein / creatinine ratio, urine   Collection Time: 04/28/19 11:50 PM  Result Value Ref Range   Creatinine, Urine 148.67 mg/dL   Total Protein, Urine 140 mg/dL   Protein Creatinine Ratio 0.94 (H) 0.00 - 0.15 mg/mg[Cre]  Comprehensive metabolic panel   Collection Time: 04/29/19 12:19 AM  Result Value Ref Range   Sodium 137 135 - 145 mmol/L   Potassium 3.9 3.5 - 5.1 mmol/L   Chloride 109 98 - 111 mmol/L   CO2 18 (L) 22 - 32 mmol/L   Glucose, Bld 107 (H) 70 - 99 mg/dL   BUN 5 (L) 6 - 20 mg/dL   Creatinine, Ser 0.62 0.44 - 1.00 mg/dL   Calcium 8.6 (L) 8.9 - 10.3 mg/dL   Total Protein 6.2 (L) 6.5 - 8.1 g/dL   Albumin 2.7 (L) 3.5 - 5.0 g/dL   AST 30 15 - 41 U/L   ALT 37 0 - 44 U/L   Alkaline Phosphatase 141 (H) 38 - 126 U/L   Total Bilirubin 0.1 (L) 0.3 - 1.2 mg/dL   GFR calc non Af Amer >60 >60 mL/min   GFR calc Af Amer >60 >60 mL/min   Anion gap 10 5 - 15  CBC    Collection Time: 04/29/19 12:19 AM  Result Value Ref Range   WBC 16.1 (H) 4.0 - 10.5 K/uL   RBC 4.23 3.87 - 5.11 MIL/uL   Hemoglobin 10.8 (L) 12.0 - 15.0 g/dL   HCT 34.0 (L) 36.0 - 46.0 %   MCV 80.4 80.0 - 100.0 fL   MCH 25.5 (L) 26.0 - 34.0 pg   MCHC 31.8 30.0 - 36.0 g/dL   RDW 15.1 11.5 - 15.5 %   Platelets 248 150 - 400 K/uL   nRBC 0.0 0.0 - 0.2 %  Type and screen   Collection Time: 04/29/19 12:21 AM  Result Value Ref Range   ABO/RH(D) AB POS    Antibody Screen NEG    Sample Expiration      05/02/2019,2359 Performed at [facility], [address] [place], [place] [address]     Patient Active Problem List   Diagnosis Date Noted  . Severe preeclampsia 04/29/2019  . Severe preeclampsia, third trimester 04/29/2019  . GBS (group B Streptococcus carrier), +RV culture, currently pregnant 04/12/2019  . Late prenatal care affecting pregnancy 02/21/2019  . Supervision of other normal pregnancy, antepartum 01/27/2019  . Food allergy 02/11/2016  . Seasonal and perennial allergic rhinoconjunctivitis 02/11/2016  . Atopic dermatitis 02/11/2016    Assessment/Plan:  Alyssa [name] is a 23 y.o. G1P0000 at 34w2dhere for IOL for severe preeclampsia.  Labor:  --PO cytotec --Magnesium sulfate --PCN for GBS --Pain control: IV pain medication, planning epidural  Fetal Wellbeing:  -- EFW 2157gm by ultrasound on  66/12/2295-- Cephalic by cervical exam -- GBS positive -- Continuous fetal monitoring  Postpartum Planning -- Breast/bottle --  Contraception: undecided. Pt counseled on options -- Circumcision: planning  Maryagnes Amos, SNM   I personally saw and evaluated the patient, performing the key elements of the service. I developed and verified the management plan that is described in the resident's/student's note, and I agree with the content with my edits above. VSS, HRR&R, Resp unlabored, Legs neg.  Nigel Berthold, CNM 04/29/2019 5:12 AM

## 2019-04-29 NOTE — Progress Notes (Signed)
  Patient Vitals for the past 6 hrs:  BP Temp Temp src Pulse Resp Height Weight  04/29/19 0701 (!) 148/101 - - 73 17 - -  04/29/19 0642 - 97.9 F (36.6 C) Axillary - - - -  04/29/19 0631 (!) 135/96 - - 72 - - -  04/29/19 0601 (!) 141/98 - - 81 - - -  04/29/19 0531 128/88 - - 73 16 - -  04/29/19 0501 (!) 151/95 - - 74 16 - -  04/29/19 0451 (!) 164/111 - - 80 16 - -  04/29/19 0401 (!) 137/102 - - 94 16 - -  04/29/19 0331 122/77 - - 75 16 - -  04/29/19 0300 133/87 - - 83 16 - -  04/29/19 0247 131/86 - - 66 16 - -  04/29/19 0231 (!) 152/107 - - 94 16 - -  04/29/19 0224 - 98.5 F (36.9 C) Axillary - - - -  04/29/19 0216 (!) 146/101 - - 84 - - -  04/29/19 0213 (!) 144/107 - - 81 16 - -  04/29/19 0200 (!) 144/111 - - 84 16 - -  04/29/19 0144 (!) 145/113 - - 75 17 - -  04/29/19 0140 - - - - - 5\' 2"  (1.575 m) 92.5 kg    FHR 115, Cat 1. MgS04@2mg /hr.  No HA/RUQ pain.  Mild and irregular ctx.  2nd cytotec at 0640, cx still FT>  Continue present mgt.

## 2019-04-29 NOTE — Progress Notes (Signed)
Physical Exam:  CE: 9 cm, right anterior lip palpated at 90%. No cervix appreciated on left.   Plan:  Start pitocin ~ 1800. Plans for AROM soon.  Pain: Contemplating epidural, not having any painful contractions at this time.   Severe pre-e Continue Mg. Monitor for toxicity   Genia Hotter, M.D.  Family Medicine  PGY-1 04/29/2019 6:21 PM

## 2019-04-29 NOTE — Progress Notes (Signed)
Labor Progress Note  Subjective: In to assess patient. Pt is breathing well through contractions. Reports feeling every 2-5 minutes. Pt does not want anything for pain at this time. Pt denies any other complaints.   Objective: BP (!) 147/98   Pulse 90   Temp 98.5 F (36.9 C) (Oral)   Resp 20   Ht 5\' 2"  (1.575 m)   Wt 92.5 kg   LMP 07/28/2018 (Exact Date)   SpO2 100%   BMI 37.31 kg/m   Dilation: 5.5 Effacement (%): 70, 80 Cervical Position: Posterior Station: 0 Presentation: Vertex Exam by:: SRussell, RN   Assessment and Plan: 23 y.o. G1P0000 [redacted]w[redacted]d admitted for IOL for severe preeclampsia. In to discuss AROM, risks and benefits. Pt gives consent.   Labor:  -- AROM @2100 , clear/pink fluid -- Pitocin -- Mag 2g/hr -- PCN for +GBS -- Pain control: none -- PPH Risk: high  Fetal Well-Being:  -- Cephalic by cervical exam.  -- Category 1; FHR 120; moderate variability, +accels, no decels -- Continuous fetal monitoring   Camelia Eng, SNM 9:07 PM

## 2019-04-29 NOTE — Progress Notes (Signed)
OB/GYN Faculty Practice: Labor Progress Note  Subjective: Sleeping, comfortable. Plan of care discussed with RN.  Objective: BP 123/78   Pulse 71   Temp 97.9 F (36.6 C) (Oral)   Resp 16   Ht 5\' 2"  (1.575 m)   Wt 92.5 kg   LMP 07/28/2018 (Exact Date)   SpO2 98%   BMI 37.31 kg/m  Gen: patient sleeping Dilation: Fingertip Effacement (%): 70 Cervical Position: Posterior Station: -1 Presentation: Vertex Exam by:: Cyprus McHenry RN  Assessment and Plan: 23 y.o. G1P0000 [redacted]w[redacted]d here for IOL for severe preeclampsia.   Labor: Induction started with cytotec this morning. Has received 2 doses of buccal cytotec - last at 0640. Will plan to recheck in about an hour an evaluate for FB placement and likely next dose of cytotec.  -- pain control: open to options -- PPH Risk: medium  Fetal Well-Being: EFW FGR (2157g, <10% yesterday with AC <3%). Cephalic by sutures on prior checks.  -- Category I - continuous fetal monitoring  -- GBS positive - PCN   Severe Preeclampsia: Severe features by BP and HA/scotoma. Received two doses of IV labetalol and now normal BP. Asymptomatic at this time. UPC 0.94, labs wnl. No signs of Mg++ toxicity.  -- Mg++ -- continue to monitor closely  Alyssa Haertel S. Earlene Plater, DO OB/GYN Fellow, Faculty Practice  9:37 AM

## 2019-04-29 NOTE — MAU Provider Note (Signed)
Chief Complaint:  Hypertension   First Provider Initiated Contact with Patient 04/29/19 0020     HPI: Alyssa [name] is a 23 y.o. G1P0000 at 3739w2dwho presents to maternity admissions reporting elevated blood pressure and headache. . She reports good fetal movement, denies LOF, vaginal bleeding, vaginal itching/burning, urinary symptoms, h/a, dizziness, n/v, diarrhea, constipation or fever/chills.  She denies  visual changes or RUQ abdominal pain.  RN Note: BP was 135/89 and they wanted to induce me.  They said induction might be on Sunday or Monday. Then I checked blood pressure at hourly, bp was  153/117 when I left to come here. Headache all day. No bleeding. No leaking. Baby moving well. Contractions but haven't been timing them.   Past Medical History: Past Medical History:  Diagnosis Date  . ADHD (attention deficit hyperactivity disorder)    ADHD  . Angio-edema   . Asthma   . Eczema   . Recurrent upper respiratory infection (URI)   . Urticaria     Past obstetric history: OB History  Gravida Para Term Preterm AB Living  1 0 0 0 0 0  SAB TAB Ectopic Multiple Live Births  0 0 0 0 0    # Outcome Date GA Lbr Len/2nd Weight Sex Delivery Anes PTL Lv  1 Current             Past Surgical History: Past Surgical History:  Procedure Laterality Date  . WISDOM TOOTH EXTRACTION    . WISDOM TOOTH EXTRACTION      Family History: Family History  Problem Relation Age of Onset  . Asthma Mother   . Eczema Mother   . Allergic rhinitis Mother   . Angioedema Mother   . Asthma Brother   . Asthma Brother   . Allergic rhinitis Brother   . Urticaria Neg Hx   . Immunodeficiency Neg Hx     Social History: Social History   Tobacco Use  . Smoking status: Never Smoker  . Smokeless tobacco: Never Used  Substance Use Topics  . Alcohol use: No  . Drug use: Not Currently    Types: Marijuana    Comment: last used 12/19    Allergies:  Allergies  Allergen Reactions  .  Shellfish Allergy Hives    "Throat closes"  . Banana Itching and Swelling  . Peach Flavor Itching    Meds:  Medications Prior to Admission  Medication Sig Dispense Refill Last Dose  . Prenatal Vit-Fe Fumarate-FA (MULTIVITAMIN-PRENATAL) 27-0.8 MG TABS tablet Take 1 tablet by mouth daily at 12 noon.   04/28/2019 at Unknown time  . albuterol (PROAIR HFA) 108 (90 Base) MCG/ACT inhaler TWO PUFFS EVERY 4-6 HOURS AS NEEDED FOR COUGH OR WHEEZE. (Patient not taking: Reported on 04/27/2019) 1 Inhaler 6 Not Taking  . albuterol (PROVENTIL) (2.5 MG/3ML) 0.083% nebulizer solution Take 3 mLs (2.5 mg total) by nebulization every 6 (six) hours as needed for wheezing or shortness of breath. (Patient not taking: Reported on 02/21/2019) 75 mL 12 Not Taking  . EPINEPHrine 0.3 mg/0.3 mL IJ SOAJ injection Inject 0.3 mLs (0.3 mg total) into the muscle once. (Patient not taking: Reported on 02/21/2019) 2 Device 2 Not Taking  . fluconazole (DIFLUCAN) 100 MG tablet Take 1 tablet (100 mg total) by mouth daily for 30 days. (Patient not taking: Reported on 04/12/2019) 30 tablet 0 Not Taking  . glycopyrrolate (ROBINUL) 1 MG tablet Take 1 tablet (1 mg total) by mouth 3 (three) times daily. For spitting (Patient not taking: Reported  on 01/27/2019) 30 tablet 1 Not Taking  . ondansetron (ZOFRAN ODT) 4 MG disintegrating tablet Take 1 tablet (4 mg total) by mouth every 8 (eight) hours as needed for nausea or vomiting. (Patient not taking: Reported on 01/27/2019) 20 tablet 0 Not Taking  . potassium chloride (KLOR-CON) 20 MEQ packet Take 20 mEq by mouth 2 (two) times daily for 3 days. 6 packet 0   . promethazine (PHENERGAN) 25 MG tablet Take 1 tablet (25 mg total) by mouth every 6 (six) hours as needed for nausea or vomiting. (Patient not taking: Reported on 01/27/2019) 30 tablet 0 Not Taking  . tinidazole (TINDAMAX) 500 MG tablet Take 2 tablets (1,000 mg total) by mouth daily with breakfast. (Patient not taking: Reported on 02/21/2019) 10 tablet 2  Not Taking    I have reviewed patient's Past Medical Hx, Surgical Hx, Family Hx, Social Hx, medications and allergies.   ROS:  Review of Systems Other systems negative  Physical Exam   Patient Vitals for the past 24 hrs:  BP Temp Temp src Pulse Resp SpO2  04/29/19 0030 (!) 148/99 - - 80 - -  04/29/19 0016 (!) 136/95 - - 69 - -  04/29/19 0014 - - - - - 98 %  04/29/19 0009 - - - - - 100 %  04/29/19 0004 - - - - - 100 %  04/29/19 0000 (!) 163/110 - - 94 - -  04/28/19 2359 - - - - - 100 %  04/28/19 2345 (!) 147/107 - - 77 - -  04/28/19 2344 - - - - - 99 %  04/28/19 2339 - - - - - 98 %  04/28/19 2337 (!) 151/109 - - 81 - -  04/28/19 2335 (!) 143/99 98.3 F (36.8 C) Oral 85 16 -   Constitutional: Well-developed, well-nourished female in no acute distress.  Cardiovascular: normal rate and rhythm Respiratory: normal effort, clear to auscultation bilaterally GI: Abd soft, non-tender, gravid appropriate for gestational age.   No rebound or guarding. MS: Extremities nontender, Trace to 1+ edema, normal ROM Neurologic: Alert and oriented x 4.  DTRs 3+ with no clonus GU: Neg CVAT.  PELVIC EXAM:  Dilation: Fingertip Effacement (%): 70 Station: -2 Exam by:: Artelia Laroche cnm  FHT:  Baseline 145 , moderate variability, accelerations present, no decelerations Contractions: q 3-4 mins Irregular and mild   Labs: AB/Positive/-- (03/05 1345) Results for orders placed or performed during the hospital encounter of 04/28/19 (from the past 24 hour(s))  Urinalysis, Routine w reflex microscopic     Status: Abnormal   Collection Time: 04/28/19 11:21 PM  Result Value Ref Range   Color, Urine YELLOW YELLOW   APPearance HAZY (A) CLEAR   Specific Gravity, Urine 1.018 1.005 - 1.030   pH 6.0 5.0 - 8.0   Glucose, UA NEGATIVE NEGATIVE mg/dL   Hgb urine dipstick NEGATIVE NEGATIVE   Bilirubin Urine NEGATIVE NEGATIVE   Ketones, ur NEGATIVE NEGATIVE mg/dL   Protein, ur 161 (A) NEGATIVE mg/dL    Nitrite NEGATIVE NEGATIVE   Leukocytes,Ua LARGE (A) NEGATIVE   RBC / HPF 6-10 0 - 5 RBC/hpf   WBC, UA 11-20 0 - 5 WBC/hpf   Bacteria, UA RARE (A) NONE SEEN   Squamous Epithelial / LPF 11-20 0 - 5   Mucus PRESENT   Protein / creatinine ratio, urine     Status: Abnormal   Collection Time: 04/28/19 11:50 PM  Result Value Ref Range   Creatinine, Urine 148.67 mg/dL  Total Protein, Urine 140 mg/dL   Protein Creatinine Ratio 0.94 (H) 0.00 - 0.15 mg/mg[Cre]  Comprehensive metabolic panel     Status: Abnormal   Collection Time: 04/29/19 12:19 AM  Result Value Ref Range   Sodium 137 135 - 145 mmol/L   Potassium 3.9 3.5 - 5.1 mmol/L   Chloride 109 98 - 111 mmol/L   CO2 18 (L) 22 - 32 mmol/L   Glucose, Bld 107 (H) 70 - 99 mg/dL   BUN 5 (L) 6 - 20 mg/dL   Creatinine, Ser 8.18 0.44 - 1.00 mg/dL   Calcium 8.6 (L) 8.9 - 10.3 mg/dL   Total Protein 6.2 (L) 6.5 - 8.1 g/dL   Albumin 2.7 (L) 3.5 - 5.0 g/dL   AST 30 15 - 41 U/L   ALT 37 0 - 44 U/L   Alkaline Phosphatase 141 (H) 38 - 126 U/L   Total Bilirubin 0.1 (L) 0.3 - 1.2 mg/dL   GFR calc non Af Amer >60 >60 mL/min   GFR calc Af Amer >60 >60 mL/min   Anion gap 10 5 - 15  CBC     Status: Abnormal   Collection Time: 04/29/19 12:19 AM  Result Value Ref Range   WBC 16.1 (H) 4.0 - 10.5 K/uL   RBC 4.23 3.87 - 5.11 MIL/uL   Hemoglobin 10.8 (L) 12.0 - 15.0 g/dL   HCT 56.3 (L) 14.9 - 70.2 %   MCV 80.4 80.0 - 100.0 fL   MCH 25.5 (L) 26.0 - 34.0 pg   MCHC 31.8 30.0 - 36.0 g/dL   RDW 63.7 85.8 - 85.0 %   Platelets 248 150 - 400 K/uL   nRBC 0.0 0.0 - 0.2 %  Type and screen     Status: None   Collection Time: 04/29/19 12:21 AM  Result Value Ref Range   ABO/RH(D) AB POS    Antibody Screen NEG    Sample Expiration      05/02/2019,2359 Performed at [facility], [address] [place], [place] [address]   ABO/Rh     Status: None   Collection Time: 04/29/19 12:21 AM  Result Value Ref Range   ABO/RH(D)      AB POS Performed at [facility], [address], [place], [place] [address]   SARS Coronavirus 2     Status: None   Collection Time: 04/29/19 12:59 AM  Result Value Ref Range   SARS Coronavirus 2 NOT DETECTED NOT DETECTED    Imaging:    MAU Course/MDM: I have ordered labs and reviewed results.  Protein/Cr ratio is high.   Others normal  NST reviewed, reassuring.  Treatments in MAU included Preeclampsia focused order set Recommend admission for induction due to severe range BPs and headache.    Assessment: 1. Severe pre-eclampsia in third trimester   2. Intrauterine growth restriction (IUGR) affecting care of mother, third trimester, single or unspecified fetus     Plan: Admit to Labor and Delivery Routine orders Magnesium sulfate infusion Labor team to follow  Wynelle Bourgeois CNM, MSN Certified Nurse-Midwife 04/29/2019 12:42 AM

## 2019-04-29 NOTE — MAU Note (Signed)
Covid swab collected. Explained procedure to pt and pt VERY anxious about swab.Pt grabbed  nurses hand and only able to swab nares with tip of swab in nares.

## 2019-04-29 NOTE — Progress Notes (Signed)
OB/GYN Faculty Practice: Labor Progress Note  Subjective: Doing well, comfortable. Was able to eat some food. No headaches, vision changes.   Objective: BP (!) 142/86   Pulse 84   Temp 98.2 F (36.8 C) (Oral)   Resp 16   Ht 5\' 2"  (1.575 m)   Wt 92.5 kg   LMP 07/28/2018 (Exact Date)   SpO2 98%   BMI 37.31 kg/m  Gen: well-appearing, NAD Dilation: 4 Effacement (%): 70 Cervical Position: Posterior Station: -1 Presentation: Vertex Exam by:: SRussell, RN   Assessment and Plan: 23 y.o. G1P0000 [redacted]w[redacted]d here for IOL for severe preeclampsia.   Labor: FB now out, planning to start pitocin within the hour then AROM when able.  -- pain control: open to options -- PPH Risk: medium  Fetal Well-Being: EFW FGR (2157g, <10% yesterday with AC <3%). Cephalic by sutures on prior checks.  -- Category I - continuous fetal monitoring  -- GBS positive - PCN   Severe Preeclampsia: Severe features by BP and HA/scotoma (now resolved). UPC 0.94, labs wnl. No signs of Mg++ toxicity, adequate urine output.  -- Mg++ -- continue to monitor closely  Damika Harmon S. Earlene Plater, DO OB/GYN Fellow, Faculty Practice  5:29 PM

## 2019-04-29 NOTE — Progress Notes (Signed)
Interval Progress Note  Into room to check patient. Now 1/70/-2. Discussed placement of foley balloon, and she was in agreement. FB placed without difficulty. Will monitor contractions for next 30 minutes or so and either give additional cytotec or start low-dose pitocin. Category I tracing. BP with diastolics approaching treatable range. Will start labetalol 200mg  BID po.   Alyssa [name]. Earlene Plater, DO OB/GYN Fellow

## 2019-04-30 ENCOUNTER — Encounter (HOSPITAL_COMMUNITY): Payer: Self-pay | Admitting: *Deleted

## 2019-04-30 DIAGNOSIS — O365990 Maternal care for other known or suspected poor fetal growth, unspecified trimester, not applicable or unspecified: Secondary | ICD-10-CM | POA: Diagnosis present

## 2019-04-30 LAB — COMPREHENSIVE METABOLIC PANEL
ALT: 28 U/L (ref 0–44)
AST: 22 U/L (ref 15–41)
Albumin: 2.3 g/dL — ABNORMAL LOW (ref 3.5–5.0)
Alkaline Phosphatase: 125 U/L (ref 38–126)
Anion gap: 11 (ref 5–15)
BUN: 5 mg/dL — ABNORMAL LOW (ref 6–20)
CO2: 19 mmol/L — ABNORMAL LOW (ref 22–32)
Calcium: 7.2 mg/dL — ABNORMAL LOW (ref 8.9–10.3)
Chloride: 104 mmol/L (ref 98–111)
Creatinine, Ser: 0.72 mg/dL (ref 0.44–1.00)
GFR calc Af Amer: >60 mL/min (ref >60)
GFR calc non Af Amer: >60 mL/min (ref >60)
Glucose, Bld: 89 mg/dL (ref 70–99)
Potassium: 4.1 mmol/L (ref 3.5–5.1)
Sodium: 134 mmol/L — ABNORMAL LOW (ref 135–145)
Total Bilirubin: 0.3 mg/dL (ref 0.3–1.2)
Total Protein: 5.4 g/dL — ABNORMAL LOW (ref 6.5–8.1)

## 2019-04-30 LAB — CBC
HCT: 30.7 % — ABNORMAL LOW (ref 36.0–46.0)
Hemoglobin: 9.9 g/dL — ABNORMAL LOW (ref 12.0–15.0)
MCH: 25.2 pg — ABNORMAL LOW (ref 26.0–34.0)
MCHC: 32.2 g/dL (ref 30.0–36.0)
MCV: 78.1 fL — ABNORMAL LOW (ref 80.0–100.0)
Platelets: 213 10*3/uL (ref 150–400)
RBC: 3.93 MIL/uL (ref 3.87–5.11)
RDW: 15.3 % (ref 11.5–15.5)
WBC: 23.3 10*3/uL — ABNORMAL HIGH (ref 4.0–10.5)
nRBC: 0 % (ref 0.0–0.2)

## 2019-04-30 MED ORDER — ZOLPIDEM TARTRATE 5 MG PO TABS: 5.0000 mg | ORAL_TABLET | Freq: Every evening | ORAL | Status: DC | PRN

## 2019-04-30 MED ORDER — WITCH HAZEL-GLYCERIN EX PADS: 1.0000 "application " | MEDICATED_PAD | CUTANEOUS | Status: DC | PRN

## 2019-04-30 MED ORDER — COMPLETENATE 29-1 MG PO CHEW
1.0000 | CHEWABLE_TABLET | Freq: Every day | ORAL | Status: DC
  Administered 2019-04-30 – 2019-05-02 (×2): 1 via ORAL
  Filled 2019-04-30 (×3): qty 1

## 2019-04-30 MED ORDER — SENNOSIDES-DOCUSATE SODIUM 8.6-50 MG PO TABS: 2.0000 | ORAL_TABLET | ORAL | Status: DC

## 2019-04-30 MED ORDER — ONDANSETRON HCL 4 MG PO TABS: 4.0000 mg | ORAL_TABLET | ORAL | Status: DC | PRN

## 2019-04-30 MED ORDER — DIPHENHYDRAMINE HCL 25 MG PO CAPS: 25.0000 mg | ORAL_CAPSULE | Freq: Four times a day (QID) | ORAL | Status: DC | PRN

## 2019-04-30 MED ORDER — LABETALOL HCL 5 MG/ML IV SOLN: 20.0000 mg | INTRAVENOUS | Status: DC | PRN

## 2019-04-30 MED ORDER — LABETALOL HCL 5 MG/ML IV SOLN: 40.0000 mg | INTRAVENOUS | Status: DC | PRN

## 2019-04-30 MED ORDER — IBUPROFEN 100 MG/5ML PO SUSP
600.0000 mg | Freq: Four times a day (QID) | ORAL | Status: DC
  Administered 2019-04-30 – 2019-05-02 (×7): 600 mg via ORAL
  Filled 2019-04-30 (×7): qty 30

## 2019-04-30 MED ORDER — ACETAMINOPHEN 325 MG PO TABS: 650.0000 mg | ORAL_TABLET | ORAL | Status: DC | PRN

## 2019-04-30 MED ORDER — ONDANSETRON HCL 4 MG/2ML IJ SOLN: 4.0000 mg | INTRAMUSCULAR | Status: DC | PRN

## 2019-04-30 MED ORDER — HYDRALAZINE HCL 20 MG/ML IJ SOLN
5.0000 mg | INTRAMUSCULAR | Status: DC | PRN
  Administered 2019-04-30: 5 mg via INTRAVENOUS
  Filled 2019-04-30: qty 1

## 2019-04-30 MED ORDER — IBUPROFEN 600 MG PO TABS
600.0000 mg | ORAL_TABLET | Freq: Four times a day (QID) | ORAL | Status: DC
  Administered 2019-04-30: 600 mg via ORAL
  Filled 2019-04-30: qty 1

## 2019-04-30 MED ORDER — TRANEXAMIC ACID-NACL 1000-0.7 MG/100ML-% IV SOLN: 1000.0000 mg | INTRAVENOUS | Status: DC

## 2019-04-30 MED ORDER — MAGNESIUM SULFATE 40 G IN LACTATED RINGERS - SIMPLE
2.0000 g/h | INTRAVENOUS | Status: AC
  Administered 2019-04-30 (×2): 2 g/h via INTRAVENOUS
  Filled 2019-04-30: qty 500

## 2019-04-30 MED ORDER — COCONUT OIL OIL: 1.0000 "application " | TOPICAL_OIL | Status: DC | PRN

## 2019-04-30 MED ORDER — TETANUS-DIPHTH-ACELL PERTUSSIS 5-2.5-18.5 LF-MCG/0.5 IM SUSP: 0.5000 mL | Freq: Once | INTRAMUSCULAR | Status: DC

## 2019-04-30 MED ORDER — DIBUCAINE (PERIANAL) 1 % EX OINT: 1.0000 "application " | TOPICAL_OINTMENT | CUTANEOUS | Status: DC | PRN

## 2019-04-30 MED ORDER — TRANEXAMIC ACID-NACL 1000-0.7 MG/100ML-% IV SOLN
1000.0000 mg | Freq: Once | INTRAVENOUS | Status: AC
  Administered 2019-04-30: 1000 mg via INTRAVENOUS
  Filled 2019-04-30: qty 100

## 2019-04-30 MED ORDER — HYDRALAZINE HCL 20 MG/ML IJ SOLN: 10.0000 mg | INTRAMUSCULAR | Status: DC | PRN

## 2019-04-30 MED ORDER — ENALAPRIL MALEATE 10 MG PO TABS
10.0000 mg | ORAL_TABLET | Freq: Every day | ORAL | Status: DC
  Administered 2019-04-30 – 2019-05-02 (×3): 10 mg via ORAL
  Filled 2019-04-30 (×3): qty 1

## 2019-04-30 MED ORDER — BENZOCAINE-MENTHOL 20-0.5 % EX AERO
1.0000 "application " | INHALATION_SPRAY | CUTANEOUS | Status: DC | PRN
  Administered 2019-04-30: 1 via TOPICAL
  Filled 2019-04-30: qty 56

## 2019-04-30 MED ORDER — MISOPROSTOL 200 MCG PO TABS
800.0000 ug | ORAL_TABLET | Freq: Once | ORAL | Status: AC
  Administered 2019-04-30: 800 ug via ORAL

## 2019-04-30 MED ORDER — SIMETHICONE 80 MG PO CHEW: 80.0000 mg | CHEWABLE_TABLET | ORAL | Status: DC | PRN

## 2019-04-30 MED ORDER — OXYCODONE-ACETAMINOPHEN 5-325 MG PO TABS: 1.0000 | ORAL_TABLET | ORAL | Status: DC | PRN

## 2019-04-30 MED ORDER — LACTATED RINGERS IV SOLN
INTRAVENOUS | Status: AC
  Administered 2019-04-30: 06:00:00 via INTRAVENOUS

## 2019-04-30 MED ORDER — PRENATAL MULTIVITAMIN CH: 1.0000 | ORAL_TABLET | Freq: Every day | ORAL | Status: DC

## 2019-04-30 NOTE — Lactation Note (Signed)
This note was copied from a baby's chart. Lactation Consultation Note  Patient Name: Alyssa [name] [id number] Date: 04/30/2019 Reason for consult: Follow-up assessment  1052 - 1112 - I followed up with Ms. Chambers upon request. I assisted with positioning baby "[name]" at the breast. Baby was too sleepy to latch, but I helped mom practice positioning in football hold and cross cradle hold.  Mom is also hand expressing. I recommended that she hand express at least 5 times today for breast stimulation and to feed back any EBM to baby by finger.  We fed baby about 3-5 mls of formula by bottle. Baby was very sleepy and not interested in the bottle. Emesis noted. I left bottle at the bedside for mom to offer within the hour.  We placed baby with mom skin to skin. On her available breast, I showed mom how to use a manual pump (size 30 flange). We discussed output expectations for the first days, and I recommended frequent pumping for breast stimulation (every three hours).   Maternal Data Formula Feeding for Exclusion: No Has patient been taught Hand Expression?: Yes Does the patient have breastfeeding experience prior to this delivery?: No  Feeding    LATCH Score Latch: Too sleepy or reluctant, no latch achieved, no sucking elicited.  Audible Swallowing: None  Type of Nipple: Everted at rest and after stimulation  Comfort (Breast/Nipple): Soft / non-tender  Hold (Positioning): Assistance needed to correctly position infant at breast and maintain latch.  LATCH Score: 5  Interventions Interventions: Breast feeding basics reviewed;Assisted with latch;Hand express;Hand pump;Support pillows;Adjust position;Skin to skin  Lactation Tools Discussed/Used Tools: Pump;Bottle WIC Program: Yes   Consult Status Consult Status: Follow-up Date: 05/01/19 Follow-up type: In-patient    Lenore Manner 04/30/2019, 11:27 AM
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
## 2019-04-30 NOTE — Progress Notes (Signed)
Daily Antepartum Note  Admission Date: 04/28/2019 Current Date: 04/30/2019 7:53 AM  Alyssa [name] is a 23 y.o. G1P1001 PPD#1 (just before MN, EBL 225mL), admitted for severe pre-eclampsia (BPs).  Pregnancy complicated by: Patient Active Problem List   Diagnosis Date Noted  . Severe preeclampsia 04/29/2019  . GBS (group B Streptococcus carrier), +RV culture, currently pregnant 04/12/2019  . Late prenatal care affecting pregnancy 02/21/2019  . Supervision of other normal pregnancy, antepartum 01/27/2019  . Mood disorder (H[facility]) 01/21/2016    Overnight/24hr events:  none  Subjective:  No s/s of pre-eclampsia, pain controlled, minimal lochia  Objective:    Current Vital Signs 24h Vital Sign Ranges  T 98.2 F (36.8 C) Temp  Avg: 98.4 F (36.9 C)  Min: 97.9 F (36.6 C)  Max: 99 F (37.2 C)  BP 119/78 BP  Min: 107/68  Max: 160/101  HR (!) 106 Pulse  Avg: 89.3  Min: 62  Max: 112  RR 18 Resp  Avg: 17.3  Min: 15  Max: 20  SaO2 100 % Room Air SpO2  Avg: 99.8 %  Min: 99 %  Max: 100 %       24 Hour I/O Current Shift I/O  Time Ins Outs 06/05 0701 - 06/06 0700 In: 3725.1 [P.O.:1120; I.V.:2186.3] Out: 4024 [Urine:3700] No intake/output data recorded.   Patient Vitals for the past 24 hrs:  BP Temp Temp src Pulse Resp SpO2  04/30/19 0610 119/78 98.2 F (36.8 C) Oral (!) 106 18 100 %  04/30/19 0419 - 98.4 F (36.9 C) Oral - - -  04/30/19 0311 (!) 148/99 99 F (37.2 C) Oral 95 18 100 %  04/30/19 0241 (!) 146/100 - - 99 - -  04/30/19 0211 (!) 160/101 98.8 F (37.1 C) Oral 94 18 99 %  04/30/19 0152 (!) 157/111 - - 98 - -  04/30/19 0131 133/85 - - 93 16 -  04/30/19 0116 132/87 - - 95 15 -  04/30/19 0101 (!) 141/83 - - 93 16 -  04/30/19 0046 (!) 142/86 - - 95 16 -  04/30/19 0031 132/78 98.5 F (36.9 C) Axillary 100 17 -  04/30/19 0017 (!) 138/102 - - (!) 112 18 -  04/30/19 0001 (!) 131/91 - - (!) 108 17 -  04/29/19 2353 135/77 - - (!) 106 18 -  04/29/19 2332 (!) 137/98 -  - - - -  04/29/19 2301 (!) 136/99 - - (!) 103 20 -  04/29/19 2232 107/68 - - 80 20 -  04/29/19 2214 125/80 - - 93 20 -  04/29/19 2201 125/80 - - 93 - -  04/29/19 2131 131/88 - - 100 20 -  04/29/19 2107 (!) 151/97 - - 98 20 -  04/29/19 2102 (!) 150/118 - - (!) 108 - -  04/29/19 2101 (!) 146/115 - - 96 - -  04/29/19 2030 (!) 147/98 - - 90 20 -  04/29/19 2016 (!) 137/93 - - 98 18 -  04/29/19 1931 130/82 - - 91 18 -  04/29/19 1925 - 98.5 F (36.9 C) Oral - - -  04/29/19 1900 (!) 153/107 - - (!) 103 16 -  04/29/19 1830 (!) 150/106 - - 89 18 100 %  04/29/19 1800 (!) 155/106 - - 88 16 -  04/29/19 1730 139/89 - - 99 16 -  04/29/19 1700 (!) 142/86 98.2 F (36.8 C) Oral 84 16 -  04/29/19 1630 (!) 136/103 - - 91 18 -  04/29/19 1600 (!) 126/92 - -  94 16 -  04/29/19 1501 (!) 144/88 - - 81 - -  04/29/19 1430 (!) 149/101 - - 75 16 -  04/29/19 1400 (!) 141/92 - - 74 16 -  04/29/19 1330 138/87 - - 62 16 -  04/29/19 1300 116/69 - - 65 18 -  04/29/19 1230 121/76 - - 71 16 -  04/29/19 1200 (!) 158/109 98.2 F (36.8 C) Oral 85 18 -  04/29/19 1130 (!) 140/96 - - 89 16 -  04/29/19 1105 (!) 155/106 - - 81 16 -  04/29/19 1030 (!) 151/107 - - 79 16 -  04/29/19 1015 (!) 152/102 - - 74 18 -  04/29/19 1000 (!) 143/111 - - 80 16 -  04/29/19 0930 123/78 - - 71 16 -  04/29/19 0900 126/79 - - 75 18 -  04/29/19 0830 115/68 - - 73 16 -  04/29/19 0800 126/78 97.9 F (36.6 C) Oral 68 18 -   UOP: >17800mL/hr  Physical exam: General: Well nourished, well developed female in no acute distress. Abdomen: nttp, nd, firm fundus below the umbilicus Cardiovascular: S1, S2 normal, no murmur, rub or gallop, regular rate and rhythm Respiratory: CTAB Extremities: no clubbing, cyanosis or edema Skin: Warm and dry.   Medications: Current Facility-Administered Medications  Medication Dose Route Frequency Provider Last Rate Last Dose  . acetaminophen (TYLENOL) tablet 650 mg  650 mg Oral Q4H PRN Arabella MerlesShaw, Kimberly D, CNM       . benzocaine-Menthol (DERMOPLAST) 20-0.5 % topical spray 1 application  1 application Topical PRN Arabella MerlesShaw, Kimberly D, CNM   1 application at 04/30/19 0234  . coconut oil  1 application Topical PRN Arabella MerlesShaw, Kimberly D, CNM      . witch hazel-glycerin (TUCKS) pad 1 application  1 application Topical PRN Arabella MerlesShaw, Kimberly D, CNM       And  . dibucaine (NUPERCAINAL) 1 % rectal ointment 1 application  1 application Rectal PRN Arabella MerlesShaw, Kimberly D, CNM      . diphenhydrAMINE (BENADRYL) capsule 25 mg  25 mg Oral Q6H PRN Arabella MerlesShaw, Kimberly D, CNM      . enalapril (VASOTEC) tablet 10 mg  10 mg Oral Daily Arabella MerlesShaw, Kimberly D, CNM   10 mg at 04/30/19 0231  . hydrALAZINE (APRESOLINE) injection 5 mg  5 mg Intravenous PRN Cam HaiShaw, Kimberly D, CNM   5 mg at 04/30/19 [id number]   And  . hydrALAZINE (APRESOLINE) injection 10 mg  10 mg Intravenous PRN Arabella MerlesShaw, Kimberly D, CNM       And  . labetalol (NORMODYNE) injection 20 mg  20 mg Intravenous PRN Arabella MerlesShaw, Kimberly D, CNM       And  . labetalol (NORMODYNE) injection 40 mg  40 mg Intravenous PRN Arabella MerlesShaw, Kimberly D, CNM      . ibuprofen (ADVIL) tablet 600 mg  600 mg Oral Q6H Cam HaiShaw, Kimberly D, CNM   600 mg at 04/30/19 [id number]  . lactated ringers infusion   Intravenous Continuous Eclectic BingPickens, Mally Gavina, MD 100 mL/hr at 04/30/19 0608    . magnesium sulfate 40 grams in LR 500 mL OB infusion  2 g/hr Intravenous Titrated [place] BingPickens, Amol Domanski, MD 25 mL/hr at 04/30/19 0213 2 g/hr at 04/30/19 0213  . misoprostol (CYTOTEC) 200 MCG tablet           . ondansetron (ZOFRAN) tablet 4 mg  4 mg Oral Q4H PRN Arabella MerlesShaw, Kimberly D, CNM       Or  . ondansetron [facility](ZOFRAN) injection 4 mg  4 mg Intravenous Q4H  PRN Myrtis Ser, CNM      . oxyCODONE-acetaminophen (PERCOCET/ROXICET) 5-325 MG per tablet 1 tablet  1 tablet Oral Q4H PRN Myrtis Ser, CNM      . prenatal multivitamin tablet 1 tablet  1 tablet Oral Q1200 Myrtis Ser, CNM      . [START ON 05/01/2019] senna-docusate (Senokot-S) tablet 2 tablet  2 tablet Oral Q24H  Myrtis Ser, CNM      . simethicone (MYLICON) chewable tablet 80 mg  80 mg Oral PRN Myrtis Ser, CNM      . [START ON 05/01/2019] Tdap (BOOSTRIX) injection 0.5 mL  0.5 mL Intramuscular Once Myrtis Ser, CNM      . zolpidem (AMBIEN) tablet 5 mg  5 mg Oral QHS PRN Myrtis Ser, CNM        Labs:  Recent Labs  Lab 04/29/19 0019 04/29/19 1827 04/30/19 0703  WBC 16.1* 19.5* 23.3*  HGB 10.8* 11.4* 9.9*  HCT 34.0* 35.7* 30.7*  PLT 248 253 213    Recent Labs  Lab 04/29/19 0019  NA 137  K 3.9  CL 109  CO2 18*  BUN 5*  CREATININE 0.62  CALCIUM 8.6*  PROT 6.2*  BILITOT 0.1*  ALKPHOS 141*  ALT 37  AST 30  GLUCOSE 107*     Radiology: no new imaging  Assessment & Plan:  Pt doing well *PP: routine care. Formula. AB POS. BC undecided *Severe pre-x: rpt labs this AM. Mg and MIVF due to come off before MN. Started on vasotec after delivery *PPx: SCDs *FEN/GI: regular diet, MIVF *Dispo: likely PPD#2-3  Durene Romans MD Attending Center for [facility])

## 2019-05-01 NOTE — Clinical Social Work Maternal (Signed)
CLINICAL SOCIAL WORK MATERNAL/CHILD NOTE  Patient Details  Name: Alyssa [name] MRN: [id number] Date of Birth: 08/11/1996  Date:  09-30-19  Clinical Social Worker Initiating Note:  Durward Fortes, Latanya Presser  Date/Time: Initiated:  05/01/19/0935     Child's Name:  Alyssa [name]    Biological Parents:  Mother, Father(Alyssa [name] (MOB, Alyssa [name] (FOB) )   Need for Interpreter:  None   Reason for Referral:  Current Substance Use/Substance Use During Pregnancy    Address:  [place] [address]    Phone number:  [phone] (home)     Additional phone number: none   Household Members/Support Persons (HM/SP):   Household Member/Support Person 2   HM/SP Name Relationship DOB or Age  HM/SP -1   Alyssa [name] (MOB's mom)   MOB's Mom   11/20/1977  HM/SP -2 Alyssa [name] (MOB)  MOB   07/16/1997  HM/SP -3        HM/SP -4        HM/SP -5        HM/SP -6        HM/SP -7        HM/SP -8          Natural Supports (not living in the home):  Parent   Professional Supports: None   Employment: Unemployed   Type of Work:   n/a  Education:  High school graduate   Homebound arranged:  n/a  Museum/gallery curator Resources:  Medicaid   Other Resources:  [facility]   Cultural/Religious Considerations Which May Impact Care:  none reported.   Strengths:  Compliance with medical plan , Ability to meet basic needs , Home prepared for child    Psychotropic Medications:       None   Pediatrician:       Pediatrician List:   [facility]      Pediatrician Fax Number:    Risk Factors/Current Problems:  None   Cognitive State:  Alert , Insightful , Able to Concentrate    Mood/Affect:  Calm , Comfortable    CSW Assessment: CSW consulted as MOB has a history of depression. While chart reviewing CSW noticed that it was noted in MOB's  chart that she used THC in 12/19. CSW went to speak with MOB regarding further needs.   Upon entering the room, CSW observed that the curtain was closed. CSW asked for permission for CSW to continue into the room. MOB agreeable. CSW introduced role and congratulated MOB on the birth of infant. CSW noted that infant was not in room. CSW inquired from Alyssa [name] on where infant was. MOB expressed that infant was taken to the NICU over night due to temperatures and feeding issues. CSW understanding and offered support to MOB. CSW explained CSW role here in the [name] as well as advised MOB of the reason for the visit.   MOB expressed that she doesn't have a history of depression. MOB reports that she has never had depression or any mental health diagnosis. CSW understanding and proceeded with gathering information on MOB"s substance use history. MOB reported that she did use THC back in December 2019 but no more after that. CSW understanding and advised MOB of the [name] drug screen policy. MOB notified that infants UDS is negative however CSW must monitor infants CDS for further needs. MOB very understanding with  no further questions.   CSW notified that MOB is from home with her mother. MOB has support from FOB and mom. MOB expressed that she is not currently working but does get WIC. CSW spoke with MOB regarding her thoughts on infant being in the NICU. MOB reported that she was sad at first but was glad that she was able to go see infant on last night. CSW advised MOB that NICU can be a challenge has infants do better some days than others. MOB expressed that she just wants infant back in the room with her. CSW offered support to her and advised MOB that if she is discharge before infant, she can stay in the room with infnat if she'd like. MOB tankful for this.   CSW provided education to MOB on SIDS as well as PPD. MOB given Postpartum Progress Checklist to track feelings. MOB also denies feeling SI or HI and  reports that she feels safe at home.MOB expressed that infant would sleep in basinet once home. CSW provided MOB with Pediatrician list to choose pediatrician for infant. MOB has all needed items to care for infant with no further needs mentioned to CSW.   CSW Plan/Description:  No Further Intervention Required/No Barriers to Discharge, CSW Will Continue to Monitor Umbilical Cord Tissue Drug Screen Results and Make Report if Warranted, [name] Drug Screen Policy Information, Sudden Infant Death Syndrome (SIDS) Education, Psychosocial Support and Ongoing Assessment of Needs    Akim Watkinson S Jomarion Mish, LCSWA 05/01/2019, 9:59 AM 

## 2019-05-01 NOTE — Lactation Note (Signed)
This note was copied from a baby's chart. Lactation Consultation Note  Patient Name: Alyssa [name] [id number] Date: 05/01/2019   Baby 47 hours old in NICU.  Mother states she recently pumped with hand pump and received a few drops.  Reviewed using colostrum containers and bring colostrum to NICU within 4 hours.  Suggest asking NICU RN for labels. Provided mother with NICU booklet.   Offered to set up DEBP and mother declined.   Suggest if she changes her mind, she can either call Lactation or RN to assist. Recommend pumping q 3 hours with manual pump for 10 min per side.      Maternal Data    Feeding Feeding Type: Formula Nipple Type: Nfant Extra Slow Flow (gold)  LATCH Score                   Interventions    Lactation Tools Discussed/Used     Consult Status      [name] 05/01/2019, 11:53 AM

## 2019-05-01 NOTE — Progress Notes (Signed)
Post Partum Day # 2 TSVD for SPEC Subjective: Pt without complaints this morning. Denies HA or visual changes. Tolerating diet. Voiding. Pain controlled  Objective: Blood pressure 114/77, pulse 72, temperature 98.1 F (36.7 C), temperature source Oral, resp. rate 15, height 5\' 2"  (1.575 m), weight 92.5 kg, last menstrual period 07/28/2018, SpO2 99 %, unknown if currently breastfeeding.  Physical Exam:  General: alert Lochia: appropriate Uterine Fundus: firm Incision: healing well DVT Evaluation: No evidence of DVT seen on physical exam.  Recent Labs    04/29/19 1827 04/30/19 0703  HGB 11.4* 9.9*  HCT 35.7* 30.7*    Assessment/Plan: PPD # 2 TSVD SPEC, s/p magnesium for 24 hrs  BP stable off Magnesium. Continue with Vasotec. Continue with progressive care and BP monitoring. Plan discharge home tomorrow if remains stable   LOS: 2 days   Alyssa [name] 05/01/2019, 7:44 AM

## 2019-05-01 NOTE — Progress Notes (Signed)
Patient asked about pump parts while she was on the phone and starting to eat lunch. Asked if patient was familiar with the use of this equipment and/or if she was ready now to pump. Patient advised she was not familiar and had not used a breast pump before. Requested patient call out for nurse when she will have 15 to 20 minutes to review the basics and get set up to pump and I would be glad to bring them back and assist her. Patient felt this was best.

## 2019-05-02 DIAGNOSIS — O99013 Anemia complicating pregnancy, third trimester: Secondary | ICD-10-CM | POA: Diagnosis present

## 2019-05-02 MED ORDER — OXYCODONE-ACETAMINOPHEN 5-325 MG PO TABS: 1.0000 | ORAL_TABLET | ORAL | 0 refills | Status: DC | PRN

## 2019-05-02 MED ORDER — SENNOSIDES-DOCUSATE SODIUM 8.6-50 MG PO TABS: 2.0000 | ORAL_TABLET | Freq: Two times a day (BID) | ORAL | 2 refills | Status: DC | PRN

## 2019-05-02 MED ORDER — FERROUS SULFATE 325 (65 FE) MG PO TABS: 325.0000 mg | ORAL_TABLET | Freq: Two times a day (BID) | ORAL | 1 refills | Status: DC

## 2019-05-02 MED ORDER — ENALAPRIL MALEATE 10 MG PO TABS: 10.0000 mg | ORAL_TABLET | Freq: Every day | ORAL | 1 refills | Status: DC

## 2019-05-02 MED ORDER — IBUPROFEN 100 MG/5ML PO SUSP: 600.0000 mg | Freq: Four times a day (QID) | ORAL | 2 refills | Status: DC | PRN

## 2019-05-02 NOTE — Lactation Note (Signed)
This note was copied from a baby's chart. Lactation Consultation Note  Patient Name: Alyssa [name] [id number] Date: 05/02/2019 Reason for consult: Follow-up assessment;NICU baby;Primapara Baby is 57 hours in the NICU.  Mom has not wanted to pump but breasts are full and uncomfortable this morning.  Mom would like to pump.  Symphony pump set up and initiated with 27 mm flanges.  Milk flowing easily.  Instructed to pump every 3 hours for 15-20 minutes to establish and maintain a milk supply.  Mom states she has a breast pump at home.  Encouraged to call for assist prn.  Maternal Data    Feeding Feeding Type: Formula Nipple Type: Nfant Extra Slow Flow (gold)  LATCH Score                   Interventions    Lactation Tools Discussed/Used Pump Review: Setup, frequency, and cleaning;Milk Storage Initiated by:: LMoulden Date initiated:: 05/02/19   Consult Status Consult Status: Follow-up Date: 05/03/19 Follow-up type: In-patient    Ave Filter 05/02/2019, 9:10 AM

## 2019-05-04 ENCOUNTER — Encounter: Payer: Medicaid Other | Admitting: Obstetrics

## 2019-05-05 ENCOUNTER — Ambulatory Visit: Payer: Self-pay

## 2019-05-05 NOTE — Lactation Note (Signed)
This note was copied from a baby's chart. Lactation Consultation Note:  Mother reports that she is using a #24 NS. Assist mother with properly placing the NS on. Mother was given a 2nd NS.   Infant placed STS with mother in football hold. #24 good fit for mother. Mother taught to do off sided latch technique.  Infant latched on but unable to flange infants lips for wide gape. His mouth is small and his lips are blanched. Mother taught to adjust lips if she could. Mother also taught to do breast compression.  Infant sustained latch for 20 mins. Observed frequent suckling and swallows.  Mothers breast very full when feeding started . She and LC both observed softening of the breast. Infant burped while LC placing him in sitting position.  Infant offered the alternate breast but was full.   Lots of teaching with mother on handling , holding and burping infant. Mother has been picking infant up and moving him using a pillow under him. I taught mother how to pick up infant in a safe manner as not to drop him. Mother receptive to all teaching.Mother is pumping every 2-3 hours and getting approx 6 ounces. Mother advised to ask staff for assistance when needed.  Suggested to mother to continue to supplement infant after feeding due to use of the NS.  Mother reported a HA pain on the Lt side of her head while talking with me. This stunned her for a few seconds. She reports having some blurred vision at times.  Suggested to mother to phone her Provider today. Reported this to [place] [facility], molecular.   Mother to follow up with [facility] as needed. Patient Name: Boy Havanah Nelms [id number] Date: 05/05/2019 Reason for consult: Follow-up assessment;NICU baby;Infant < 6lbs   Maternal Data    Feeding Feeding Type: Breast Fed  LATCH Score Latch: Grasps breast easily, tongue down, lips flanged, rhythmical sucking.  Audible Swallowing: Spontaneous and intermittent  Type of Nipple: Everted at rest and  after stimulation  Comfort (Breast/Nipple): Filling, red/small blisters or bruises, mild/mod discomfort  Hold (Positioning): Assistance needed to correctly position infant at breast and maintain latch.  LATCH Score: 8  Interventions Interventions: Assisted with latch;Skin to skin;Breast massage;Hand express;Breast compression;Adjust position;Support pillows;Position options;Expressed milk;DEBP  Lactation Tools Discussed/Used Tools: Nipple Shields Nipple shield size: 24   Consult Status Consult Status: Follow-up Date: 05/06/19 Follow-up type: In-patient    Jess Barters [facility] 05/05/2019, 2:56 PM

## 2019-05-09 ENCOUNTER — Ambulatory Visit (INDEPENDENT_AMBULATORY_CARE_PROVIDER_SITE_OTHER): Payer: Medicaid Other

## 2019-05-09 ENCOUNTER — Other Ambulatory Visit: Payer: Self-pay

## 2019-05-09 ENCOUNTER — Other Ambulatory Visit (HOSPITAL_COMMUNITY): Payer: Medicaid Other

## 2019-05-09 VITALS — BP 110/78 | HR 76 | Ht 62.0 in | Wt 180.4 lb

## 2019-05-09 DIAGNOSIS — Z0130 Encounter for examination of blood pressure without abnormal findings: Secondary | ICD-10-CM

## 2019-05-09 NOTE — Progress Notes (Signed)
Subjective:  Alyssa [name] is a 23 y.o. female here for BP check.   Hypertension ROS: taking medications as instructed, no medication side effects noted, no TIA's, no chest pain on exertion, no dyspnea on exertion and no swelling of ankles. C/o blurry vision, every other day x 1 week.    Objective:  LMP 07/28/2018 (Exact Date)   Appearance alert, well appearing, and in no distress. General exam BP noted to be well controlled today in office.    Assessment:   Blood Pressure well controlled.   Plan:  Current treatment plan is effective, no change in therapy.  Continue taking medication and return in 1 week for BP check per Dr. Jodi Mourning.

## 2019-05-11 ENCOUNTER — Inpatient Hospital Stay (HOSPITAL_COMMUNITY): Payer: Medicaid Other

## 2019-05-11 ENCOUNTER — Inpatient Hospital Stay (HOSPITAL_COMMUNITY)
Admission: AD | Admit: 2019-05-11 | Payer: Medicaid Other | Source: Home / Self Care | Admitting: Obstetrics & Gynecology

## 2019-05-19 ENCOUNTER — Ambulatory Visit (INDEPENDENT_AMBULATORY_CARE_PROVIDER_SITE_OTHER): Payer: Medicaid Other

## 2019-05-19 ENCOUNTER — Other Ambulatory Visit: Payer: Self-pay

## 2019-05-19 VITALS — BP 107/74 | HR 79 | Ht 62.0 in | Wt 180.3 lb

## 2019-05-19 DIAGNOSIS — Z0130 Encounter for examination of blood pressure without abnormal findings: Secondary | ICD-10-CM

## 2019-05-19 NOTE — Progress Notes (Signed)
Subjective:  Alyssa [name] is a 23 y.o. female here for BP check.   Hypertension ROS: taking medications as instructed, not taking medications regularly as instructed, no medication side effects noted, no TIA's, no chest pain on exertion, no dyspnea on exertion and no swelling of ankles.    Objective:  BP 107/74   Pulse 79   Ht 5\' 2"  (1.575 m)   Wt 180 lb 4.8 oz (81.8 kg)   LMP 07/28/2018 (Exact Date)   Breastfeeding Yes   BMI 32.98 kg/m   Appearance alert, well appearing, and in no distress. General exam BP noted to be well controlled today in office.    Assessment:   Blood Pressure well controlled.   Plan:  Current treatment plan is effective, no change in therapy. Please keep PP appointment per Dr. Jodi Mourning.

## 2019-05-30 ENCOUNTER — Ambulatory Visit: Payer: Medicaid Other | Admitting: Obstetrics & Gynecology

## 2019-05-30 ENCOUNTER — Ambulatory Visit: Payer: Medicaid Other | Admitting: Medical

## 2019-06-08 ENCOUNTER — Ambulatory Visit (INDEPENDENT_AMBULATORY_CARE_PROVIDER_SITE_OTHER): Payer: Medicaid Other | Admitting: Obstetrics

## 2019-06-08 ENCOUNTER — Encounter: Payer: Self-pay | Admitting: Obstetrics

## 2019-06-08 DIAGNOSIS — Z3009 Encounter for other general counseling and advice on contraception: Secondary | ICD-10-CM

## 2019-06-08 DIAGNOSIS — Z8759 Personal history of other complications of pregnancy, childbirth and the puerperium: Secondary | ICD-10-CM

## 2019-06-08 DIAGNOSIS — Z1389 Encounter for screening for other disorder: Secondary | ICD-10-CM | POA: Diagnosis not present

## 2019-06-08 NOTE — Progress Notes (Signed)
Post Partum TeleHealth Virtual Visit  I connected with Alyssa [name] at 3:10 pm on 06-08-2019 by telephone at home and verified that I am speaking with the correct person.  I informed her that this was a televisit and the limitations and risks thereof.  She agreed to proceed after expressing and understanding.  Alyssa [name] is a 23 y.o. G61P1001 female who presents for a postpartum visit. She is 5 weeks postpartum following a spontaneous vaginal delivery. I have fully reviewed the prenatal and intrapartum course. The delivery was at  unremarkable gestational weeks.  Anesthesia: none. Postpartum course has been unremarkable. Baby's course has been unremarkable. Baby is feeding by breast. Bleeding  Bowel function is normal. Bladder function is normal. Patient is sexually active. Contraception method is none. Pt wants the patch. Postpartum depression screening:neg  The following portions of the patient's history were reviewed and updated as appropriate: allergies, current medications, past family history, past medical history, past social history, past surgical history and problem list. Last pap smear done n/a and was n/a  Review of Systems A comprehensive review of systems was negative.    Objective:  Last menstrual period 07/28/2018, currently breastfeeding.  General:  alert and no distress   Breasts:  negative  Lungs: clear  Heart:  regular rate and rhythm   The remainder of the physical exam was deferred because of the nature of the visit.  Assessment:   1. Postpartum care following vaginal delivery - doing well  2. H/O severe pre-eclampsia - clinically stable BP's  3. General counseling and advice on female contraception - breast feeding.  Wants to defer contraception until finishing breast feeding   Plan:   1. Contraception: condoms 2. Continue PNV's 3. Follow up in: 3 months or as needed.   Shelly Bombard MD 06-08-2019

## 2020-05-22 ENCOUNTER — Emergency Department (HOSPITAL_COMMUNITY)
Admission: EM | Admit: 2020-05-22 | Discharge: 2020-05-22 | Disposition: A | Discharge status: Home / Self Care | Payer: Medicaid Other | Attending: Emergency Medicine | Admitting: Emergency Medicine

## 2020-05-22 ENCOUNTER — Emergency Department (HOSPITAL_COMMUNITY): Payer: Medicaid Other

## 2020-05-22 ENCOUNTER — Encounter (HOSPITAL_COMMUNITY): Payer: Self-pay | Admitting: *Deleted

## 2020-05-22 DIAGNOSIS — J45909 Unspecified asthma, uncomplicated: Secondary | ICD-10-CM | POA: Insufficient documentation

## 2020-05-22 DIAGNOSIS — R0789 Other chest pain: Secondary | ICD-10-CM

## 2020-05-22 LAB — HIV ANTIBODY (ROUTINE TESTING W REFLEX): HIV Screen 4th Generation wRfx: NONREACTIVE

## 2020-05-22 LAB — I-STAT BETA HCG BLOOD, ED (MC, WL, AP ONLY): I-stat hCG, quantitative: <5 m[IU]/mL (ref <5)

## 2020-05-22 MED ORDER — IBUPROFEN 400 MG PO TABS
600.0000 mg | ORAL_TABLET | Freq: Once | ORAL | Status: AC
  Administered 2020-05-22: 600 mg via ORAL
  Filled 2020-05-22: qty 1

## 2020-05-22 MED ORDER — SODIUM CHLORIDE 0.9% FLUSH: 3.0000 mL | Freq: Once | INTRAVENOUS | Status: DC

## 2020-05-22 MED ORDER — IBUPROFEN 600 MG PO TABS: 600.0000 mg | ORAL_TABLET | Freq: Three times a day (TID) | ORAL | 0 refills | Status: DC | PRN

## 2020-05-22 NOTE — ED Provider Notes (Signed)
[facility] EMERGENCY DEPARTMENT Provider Note   CSN: [id number] Arrival date & time: 05/22/20  1009     History Chief Complaint  Patient presents with  . Chest Pain    Alyssa [name] is a 24 y.o. female.  HPI 24 year old female presents with left chest pain.  Started 3 or 4 days ago.  At first it was mild but last night it became much worse.  She has not take anything for the pain.  No shortness of breath.  Any type of movement or palpation makes it worse.  This includes deep breathing and also moving her left arm.  The pain does not radiate into or down her left arm.  No leg swelling or calf pain.  No birth control use.  She also notes she would like to be tested for STDs.  Has had some vaginal discharge.  No abdominal pain.   Past Medical History:  Diagnosis Date  . ADHD (attention deficit hyperactivity disorder)    ADHD  . Angio-edema   . Asthma   . Atopic dermatitis 02/11/2016  . Eczema   . Recurrent upper respiratory infection (URI)   . Seasonal and perennial allergic rhinoconjunctivitis 02/11/2016  . Urticaria     Patient Active Problem List   Diagnosis Date Noted  . Anemia of pregnancy, third trimester 05/02/2019  . Pregnancy affected by fetal growth restriction 04/30/2019  . Severe preeclampsia, delivered 04/29/2019  . GBS (group B Streptococcus carrier), +RV culture, currently pregnant 04/12/2019  . Late prenatal care affecting pregnancy 02/21/2019  . Supervision of high-risk pregnancy 01/27/2019  . Mood disorder (HCC) 01/21/2016    Past Surgical History:  Procedure Laterality Date  . WISDOM TOOTH EXTRACTION    . WISDOM TOOTH EXTRACTION       OB History    Gravida  1   Para  1   Term  1   Preterm  0   AB  0   Living  1     SAB  0   TAB  0   Ectopic  0   Multiple  0   Live Births  1           Family History  Problem Relation Age of Onset  . Asthma Mother   . Eczema Mother   . Allergic rhinitis  Mother   . Angioedema Mother   . Asthma Brother   . Asthma Brother   . Allergic rhinitis Brother   . Urticaria Neg Hx   . Immunodeficiency Neg Hx     Social History   Tobacco Use  . Smoking status: Never Smoker  . Smokeless tobacco: Never Used  Vaping Use  . Vaping Use: Never used  Substance Use Topics  . Alcohol use: No  . Drug use: Not Currently    Types: Marijuana    Comment: last used 12/19    Home Medications Prior to Admission medications   Medication Sig Start Date End Date Taking? Authorizing Provider  albuterol (PROAIR HFA) 108 (90 Base) MCG/ACT inhaler TWO PUFFS EVERY 4-6 HOURS AS NEEDED FOR COUGH OR WHEEZE. Patient not taking: Reported on 04/27/2019 03/08/19   Allie Bossier, MD  albuterol (PROVENTIL) (2.5 MG/3ML) 0.083% nebulizer solution Take 3 mLs (2.5 mg total) by nebulization every 6 (six) hours as needed for wheezing or shortness of breath. Patient not taking: Reported on 02/21/2019 11/25/18   Judeth Horn, NP  enalapril (VASOTEC) 10 MG tablet Take 1 tablet (10 mg total) by mouth daily.  Patient not taking: Reported on 05/19/2019 05/03/19   Anyanwu, Jethro Bastos, MD  ferrous sulfate (FERROUSUL) 325 (65 FE) MG tablet Take 1 tablet (325 mg total) by mouth 2 (two) times daily. Patient not taking: Reported on 05/19/2019 05/02/19   Anyanwu, Jethro Bastos, MD  ibuprofen (ADVIL) 100 MG/5ML suspension Take 30 mLs (600 mg total) by mouth every 6 (six) hours as needed for mild pain or moderate pain. Patient not taking: Reported on 05/19/2019 05/02/19   Tereso Newcomer, MD  oxyCODONE-acetaminophen (PERCOCET/ROXICET) 5-325 MG tablet Take 1 tablet by mouth every 4 (four) hours as needed for severe pain (pain scale 4-7). Patient not taking: Reported on 05/19/2019 05/02/19   Anyanwu, Jethro Bastos, MD  Prenatal Vit-Fe Fumarate-FA (MULTIVITAMIN-PRENATAL) 27-0.8 MG TABS tablet Take 1 tablet by mouth daily at 12 noon.    [provider]  senna-docusate (SENOKOT-S) 8.6-50 MG tablet Take 2 tablets by  mouth 2 (two) times daily as needed for mild constipation. Patient not taking: Reported on 05/19/2019 05/02/19   Tereso Newcomer, MD    Allergies    Shellfish allergy, Banana, and Peach flavor  Review of Systems   Review of Systems  Respiratory: Negative for shortness of breath.   Cardiovascular: Positive for chest pain.  Gastrointestinal: Negative for abdominal pain.  Genitourinary: Positive for vaginal discharge.  All other systems reviewed and are negative.   Physical Exam Updated Vital Signs BP 126/82 (BP Location: Right Arm)   Pulse 77   Temp 98.4 F (36.9 C)   Resp 18   Ht 5\' 2"  (1.575 m)   LMP 05/01/2020 (Approximate)   SpO2 100%   BMI 32.98 kg/m   Physical Exam Vitals and nursing note reviewed.  Constitutional:      Appearance: She is well-developed.  HENT:     Head: Normocephalic and atraumatic.     Right Ear: External ear normal.     Left Ear: External ear normal.     Nose: Nose normal.  Eyes:     General:        Right eye: No discharge.        Left eye: No discharge.  Cardiovascular:     Rate and Rhythm: Normal rate and regular rhythm.     Pulses:          Radial pulses are 2+ on the left side.     Heart sounds: Normal heart sounds.  Pulmonary:     Effort: Pulmonary effort is normal.     Breath sounds: Normal breath sounds.  Chest:     Chest wall: Tenderness (diffuse left chest wall tenderness) present.  Abdominal:     Palpations: Abdomen is soft.     Tenderness: There is no abdominal tenderness.  Musculoskeletal:     Right lower leg: No tenderness. No edema.     Left lower leg: No tenderness. No edema.  Skin:    General: Skin is warm and dry.  Neurological:     Mental Status: She is alert.  Psychiatric:        Mood and Affect: Mood is not anxious.     ED Results / Procedures / Treatments   Labs (all labs ordered are listed, but only abnormal results are displayed) Labs Reviewed  RPR  HIV ANTIBODY (ROUTINE TESTING W REFLEX)  I-STAT  BETA HCG BLOOD, ED ([facility], [facility], [facility] ONLY)  GC/CHLAMYDIA PROBE AMP ([place]) NOT AT [facility]    EKG EKG Interpretation  Date/Time:  Tuesday May 22 2020 10:20:39  EDT Ventricular Rate:  83 PR Interval:  160 QRS Duration: 78 QT Interval:  336 QTC Calculation: 394 R Axis:   64 Text Interpretation: Normal sinus rhythm no acute ST/T changes no significant change since May 2018 Confirmed by Pricilla Loveless [phone]) on 05/22/2020 11:12:16 AM   Radiology DG Chest 2 View  Result Date: 05/22/2020 CLINICAL DATA:  Chest pain. EXAM: CHEST - 2 VIEW COMPARISON:  April 26, 2019. FINDINGS: The heart size and mediastinal contours are within normal limits. Both lungs are clear. No pneumothorax or pleural effusion is noted. The visualized skeletal structures are unremarkable. IMPRESSION: No active cardiopulmonary disease. Electronically Signed   By: Lupita Raider M.D.   On: 05/22/2020 10:41    Procedures Procedures (including critical care time)  Medications Ordered in ED Medications  sodium chloride flush (NS) 0.9 % injection 3 mL (has no administration in time range)  ibuprofen (ADVIL) tablet 600 mg (has no administration in time range)    ED Course  I have reviewed the triage vital signs and the nursing notes.  Pertinent labs & imaging results that were available during my care of the patient were reviewed by me and considered in my medical decision making (see chart for details).    MDM Rules/Calculators/A&P                          Patient's chest pain appears to be chest wall in etiology.  Given benign ECG and normal chest x-ray with normal vital signs, I have very low suspicion of PE, ACS, dissection.  Given how tender she is the seems almost undoubtedly to be muscular in etiology.  Will prescribe NSAIDs.  She also notes that she would like to be STD tested.  Does not want pelvic exam at this time, would like to urinate and get blood drawn.  I offered self swabbing but she also declines. Will d/c  with PCP follow up. Final Clinical Impression(s) / ED Diagnoses Final diagnoses:  Chest wall pain    Rx / DC Orders ED Discharge Orders    None       Pricilla Loveless, MD 05/22/20 [phone]

## 2020-05-22 NOTE — ED Triage Notes (Signed)
To ED for eval of left chest and arm pain for past 3 days. Denies injury. Pain is worse with movement or deep breath. Denies nausea or vomiting.

## 2020-05-22 NOTE — ED Notes (Signed)
Pt refused 400 mg tablet  Ibuprofen and took the 200 mg po tab

## 2020-05-22 NOTE — Discharge Instructions (Signed)
If you develop recurrent, continued, or worsening chest pain, shortness of breath, fever, vomiting, abdominal or back pain, or any other new/concerning symptoms then return to the ER for evaluation.  

## 2020-05-23 ENCOUNTER — Telehealth: Payer: Self-pay | Admitting: Student

## 2020-05-23 DIAGNOSIS — A749 Chlamydial infection, unspecified: Secondary | ICD-10-CM

## 2020-05-23 LAB — GC/CHLAMYDIA PROBE AMP (~~LOC~~) NOT AT ARMC
Chlamydia: POSITIVE — AB
Comment: NEGATIVE
Comment: NORMAL
Neisseria Gonorrhea: NEGATIVE

## 2020-05-23 LAB — RPR: RPR Ser Ql: NONREACTIVE

## 2020-05-23 MED ORDER — AZITHROMYCIN 500 MG PO TABS: 1000.0000 mg | ORAL_TABLET | Freq: Once | ORAL | 0 refills | Status: AC

## 2020-05-23 NOTE — Telephone Encounter (Addendum)
-----   Message from Kathe Becton, RN sent at 05/23/2020  2:38 PM EDT ----- This patient tested positive for:  Chlamydia  She "has NKDA", I have informed the patient of her results and confirmed her pharmacy is correct in her chart. Please send Rx.   Thank you,   Kathe Becton, RN   Results faxed to [facility] Department.    Alyssa [name] tested positive for  Chlamydia. Patient was called by RN and allergies and pharmacy confirmed. Rx sent to pharmacy of choice.   Judeth Horn, NP 05/23/2020 3:29 PM

## 2020-06-15 IMAGING — CR DG CHEST 2V
2 series · 2 of 2 positions shown · non-contrast
Comparison: 04/15/2017

CLINICAL DATA: Shortness of breath, mid upper chest heaviness

EXAM:
CHEST - 2 VIEW

[chest pa]
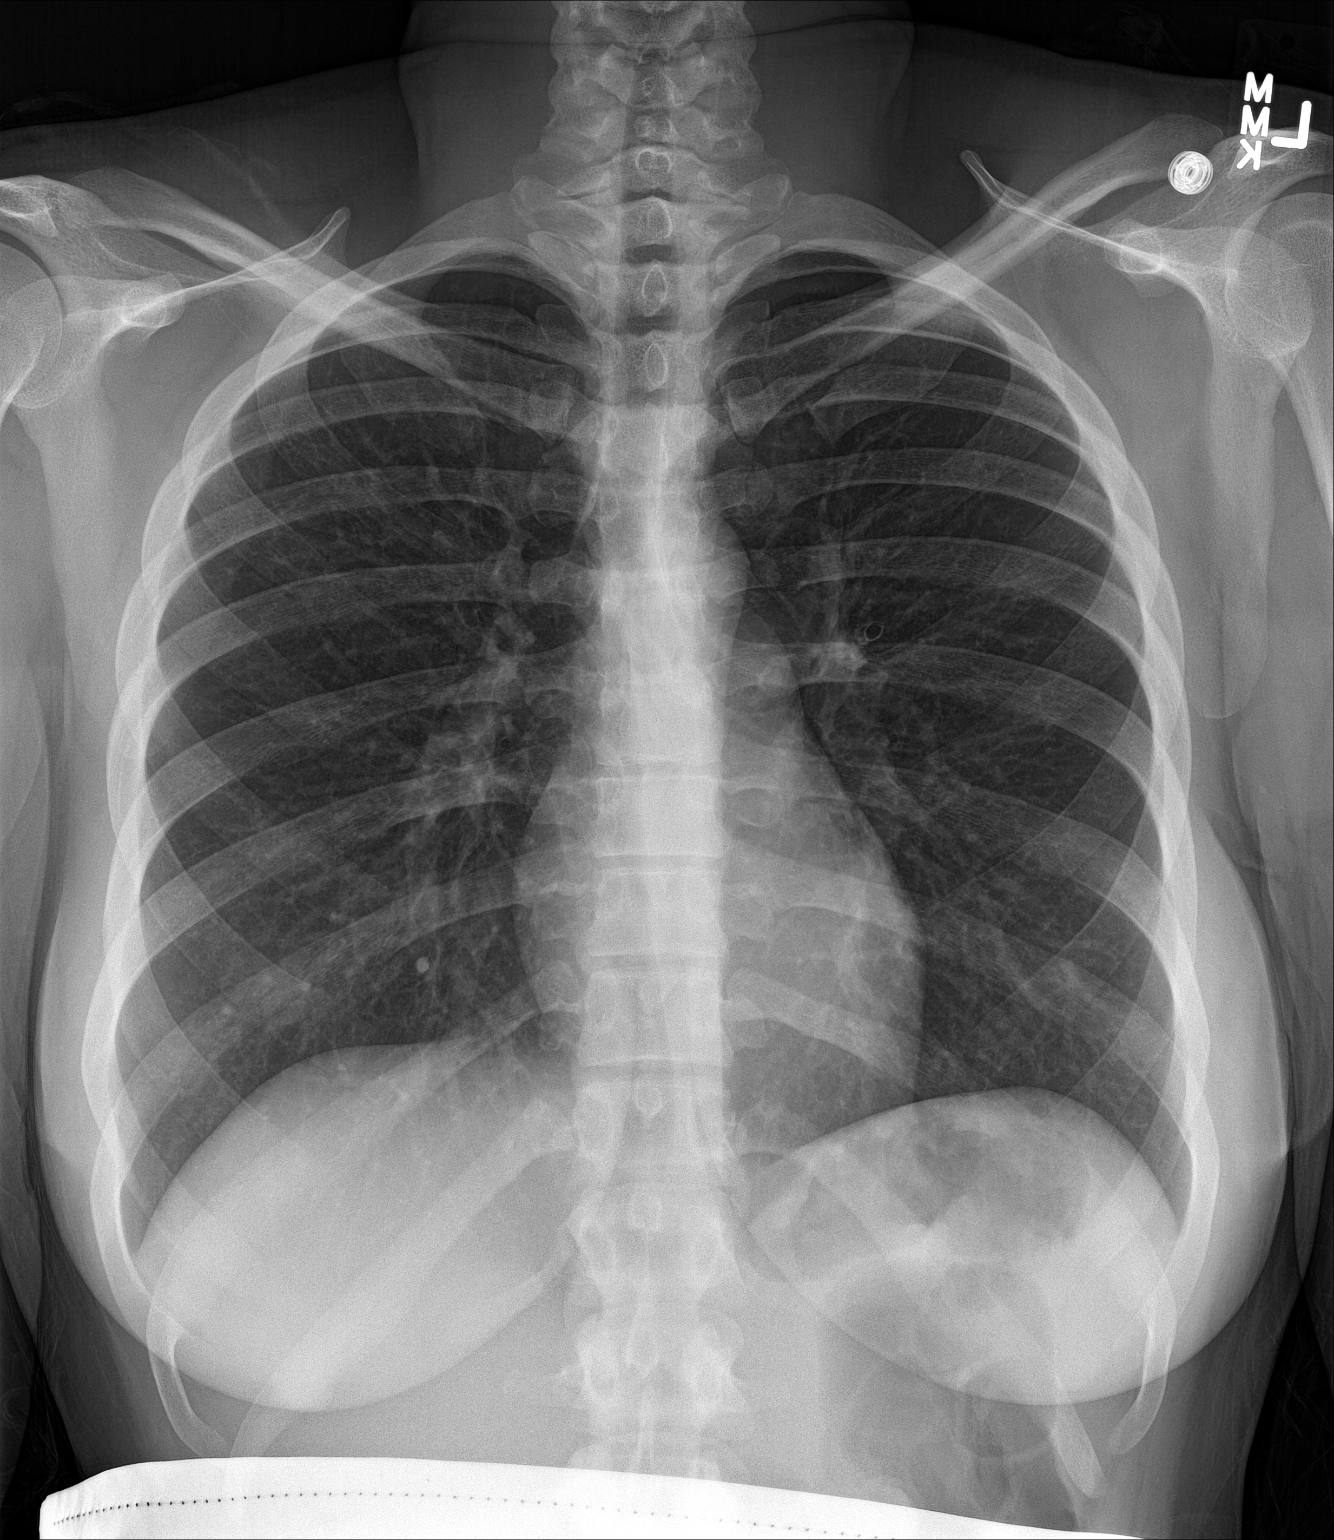

[chest lat]
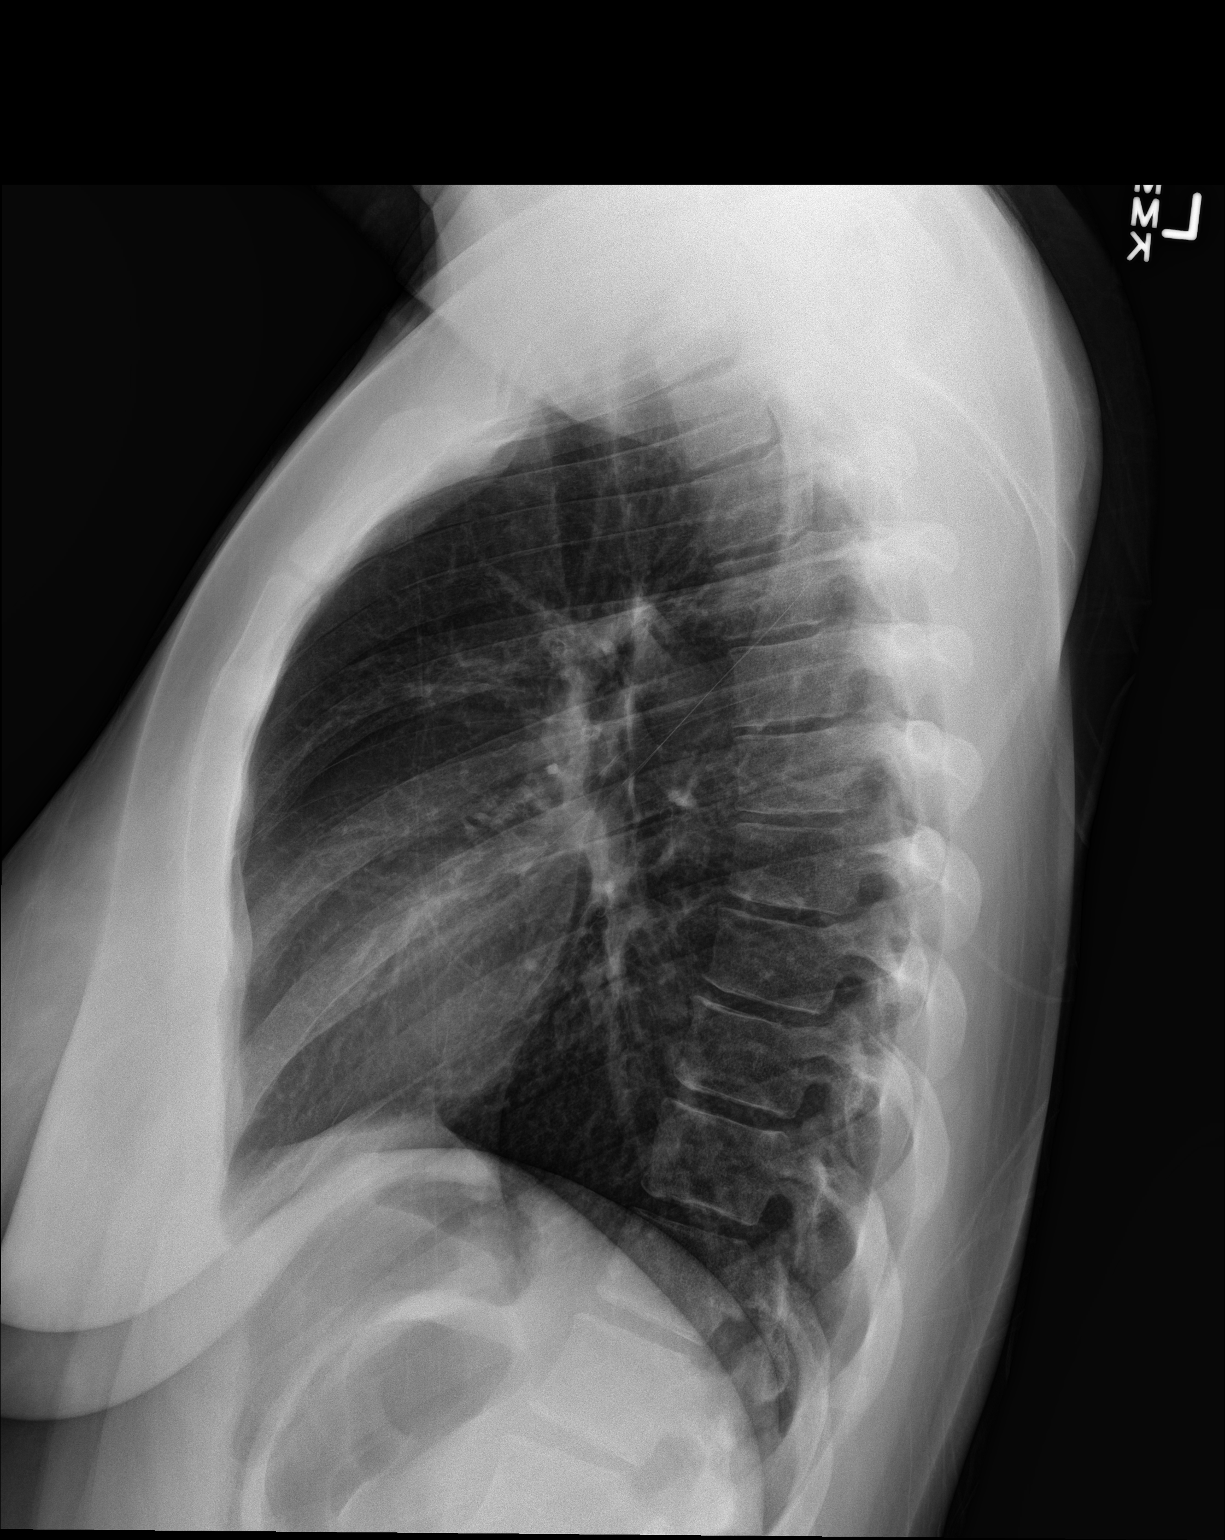

[2 of 2 positions shown; findings below may reference images not displayed]

FINDINGS: Heart and mediastinal contours are within normal limits. No focal
opacities or effusions. No acute bony abnormality.
IMPRESSION: No active cardiopulmonary disease.

## 2020-06-28 ENCOUNTER — Other Ambulatory Visit: Payer: Self-pay

## 2020-06-28 ENCOUNTER — Ambulatory Visit: Payer: Medicaid Other | Admitting: Advanced Practice Midwife

## 2020-06-28 ENCOUNTER — Encounter: Payer: Self-pay | Admitting: Advanced Practice Midwife

## 2020-06-28 DIAGNOSIS — Z113 Encounter for screening for infections with a predominantly sexual mode of transmission: Secondary | ICD-10-CM

## 2020-06-28 DIAGNOSIS — A749 Chlamydial infection, unspecified: Secondary | ICD-10-CM

## 2020-06-28 DIAGNOSIS — T7422XS Child sexual abuse, confirmed, sequela: Secondary | ICD-10-CM

## 2020-06-28 DIAGNOSIS — T7422XA Child sexual abuse, confirmed, initial encounter: Secondary | ICD-10-CM | POA: Insufficient documentation

## 2020-06-28 DIAGNOSIS — F1290 Cannabis use, unspecified, uncomplicated: Secondary | ICD-10-CM | POA: Insufficient documentation

## 2020-06-28 DIAGNOSIS — F17290 Nicotine dependence, other tobacco product, uncomplicated: Secondary | ICD-10-CM | POA: Insufficient documentation

## 2020-06-28 HISTORY — DX: Child sexual abuse, confirmed, initial encounter: T74.22XA

## 2020-06-28 HISTORY — DX: Cannabis use, unspecified, uncomplicated: F12.90

## 2020-06-28 HISTORY — DX: Nicotine dependence, other tobacco product, uncomplicated: F17.290

## 2020-06-28 LAB — WET PREP FOR TRICH, YEAST, CLUE
Trichomonas Exam: NEGATIVE
Yeast Exam: NEGATIVE

## 2020-06-28 MED ORDER — AZITHROMYCIN 500 MG PO TABS
1000.0000 mg | ORAL_TABLET | Freq: Once | ORAL | Status: AC
  Administered 2020-06-28: 1000 mg via ORAL

## 2020-06-28 NOTE — Progress Notes (Signed)
Wet mount reviewed, no tx for wet mount per standing order. Pt treated for positive chlamydia (tested 05/22/2020) per provider orders. Provider orders completed.

## 2020-06-28 NOTE — Progress Notes (Signed)
[facility] Department STI clinic/screening visit  Subjective:  Alyssa [name] is a 24 y.o.SBF G1P1 smoker female being seen today for an STI screening visit. The patient reports they do not have symptoms.  Patient reports that they do not desire a pregnancy in the next year.   They reported they are not interested in discussing contraception today.  Patient's last menstrual period was 06/28/2020.   Patient has the following medical conditions:   Patient Active Problem List   Diagnosis Date Noted  . Morbid obesity (HCC) 180 lbs 06/28/2020  . Severe preeclampsia, delivered 04/29/2019  . Mood disorder (HCC) 01/21/2016    No chief complaint on file.   HPI  Patient reports went to [facility] 04/2020 and was called by them saying she has ?Chlamydia; never picked up rx.  Last sex 06/09/20 with condom; with current partner x 2 years.  Smoking 1.5 Black & Milds/day.  Last MJ 03/14/20.  Last ETOH 06/26/20 (1 glass wine) 1x/mo.  LMP 06/26/20.  Last HIV test per patient/review of record was 05/22/20 Patient reports last pap was never  See flowsheet for further details and programmatic requirements.    The following portions of the patient's history were reviewed and updated as appropriate: allergies, current medications, past medical history, past social history, past surgical history and problem list.  Objective:  There were no vitals filed for this visit.  Physical Exam Vitals and nursing note reviewed.  Constitutional:      Appearance: Normal appearance. She is obese.  HENT:     Head: Normocephalic and atraumatic.     Mouth/Throat:     Mouth: Mucous membranes are moist.     Pharynx: Oropharynx is clear. No oropharyngeal exudate or posterior oropharyngeal erythema.  Eyes:     Conjunctiva/sclera: Conjunctivae normal.  Pulmonary:     Effort: Pulmonary effort is normal.  Abdominal:     Palpations: Abdomen is soft. There is no mass.     Tenderness: There is  no abdominal tenderness. There is no rebound.     Comments: Soft without tenderness, increased adipose, poor tone  Genitourinary:    General: Normal vulva.     Exam position: Lithotomy position.     Pubic Area: No rash or pubic lice.      Labia:        Right: No rash or lesion.        Left: No rash or lesion.      Vagina: Normal. No vaginal discharge, erythema, bleeding (red menses blood) or lesions.     Cervix: Normal.     Uterus: Normal.      Adnexa: Right adnexa normal and left adnexa normal.     Rectum: Normal.  Lymphadenopathy:     Head:     Right side of head: No preauricular or posterior auricular adenopathy.     Left side of head: No preauricular or posterior auricular adenopathy.     Cervical: No cervical adenopathy.     Upper Body:     Right upper body: No supraclavicular or axillary adenopathy.     Left upper body: No supraclavicular or axillary adenopathy.     Lower Body: No right inguinal adenopathy. No left inguinal adenopathy.  Skin:    General: Skin is warm and dry.     Findings: No rash.  Neurological:     Mental Status: She is alert and oriented to person, place, and time.      Assessment and Plan:  [name]  is a 24 y.o. female presenting to the [facility] Department for STI screening  1. Screening examination for venereal disease Treat wet mount per standing orders Immunization nurse consult Pt states she was called by [facility] 04/2020 and told she has chlamydia--never picked up rx so needs tx today Needs 1 contact card for partner for Chlamydia Counseled to stop smoking - WET PREP FOR TRICH, YEAST, CLUE - Syphilis Serology, [facility] - HIV [facility] - Chlamydia/Gonorrhea  Lab  2. Morbid obesity (HCC) 180 lbs     Return if symptoms worsen or fail to improve.  No future appointments.  Alberteen Spindle, CNM

## 2020-09-29 IMAGING — US US MFM OB DETAIL +14 WK
1 series · 13 of 28 positions shown · non-contrast
Comparison: none

[Series 1: us mfm ob detail +14 wk · 13 of 63 slices shown]
[im 3/63]
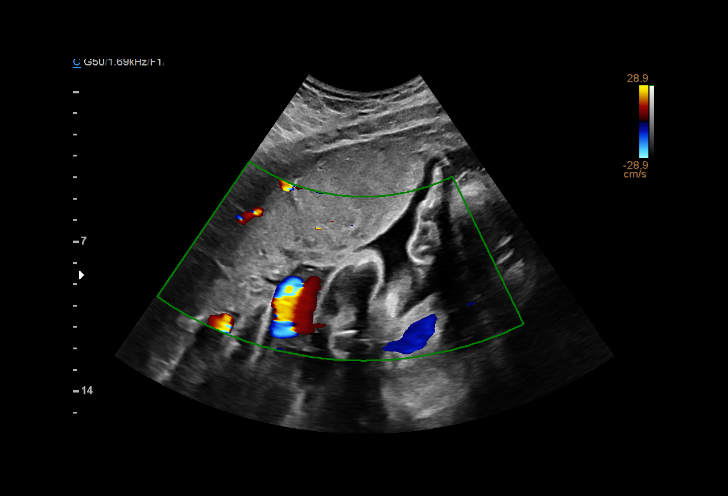
[im 7/63]
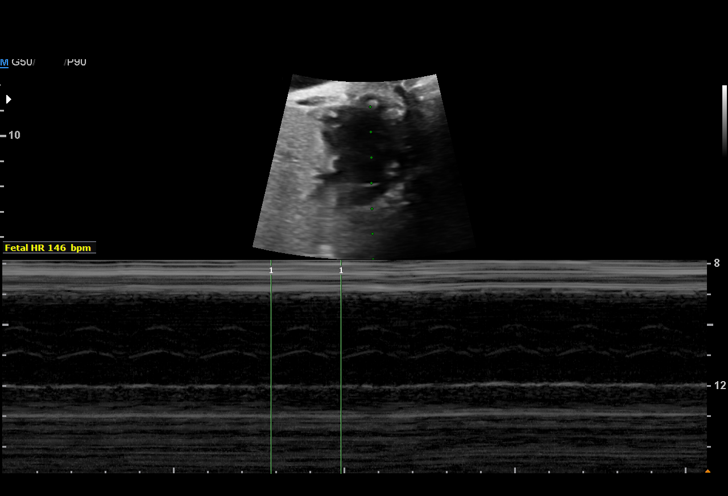
[im 12/63]
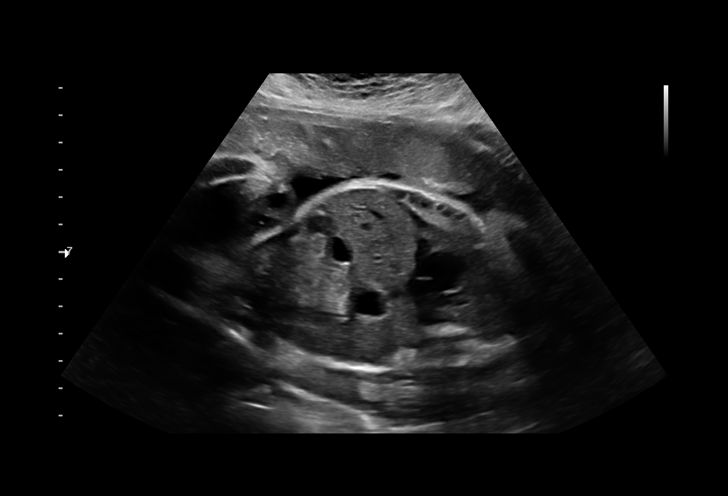
[im 17/63]
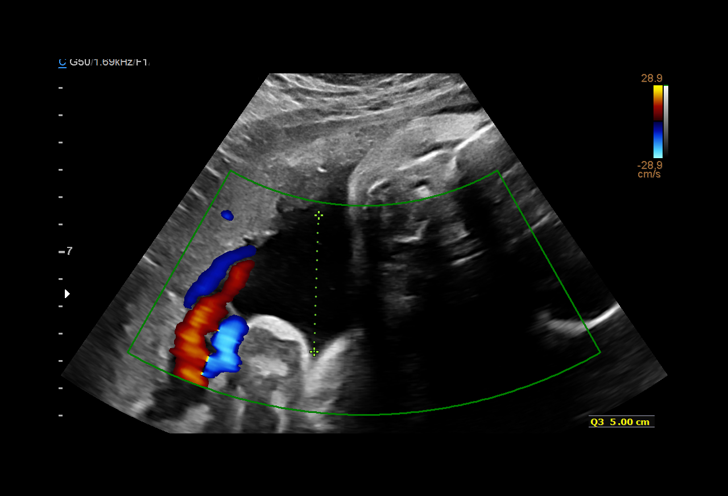
[im 21/63]
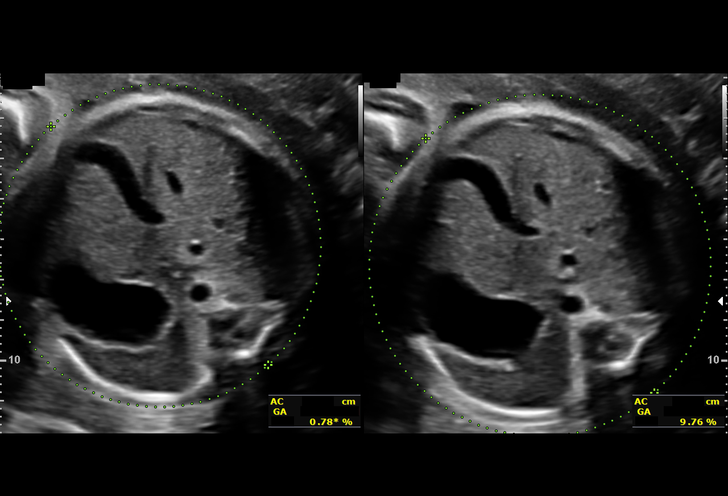
[im 26/63]
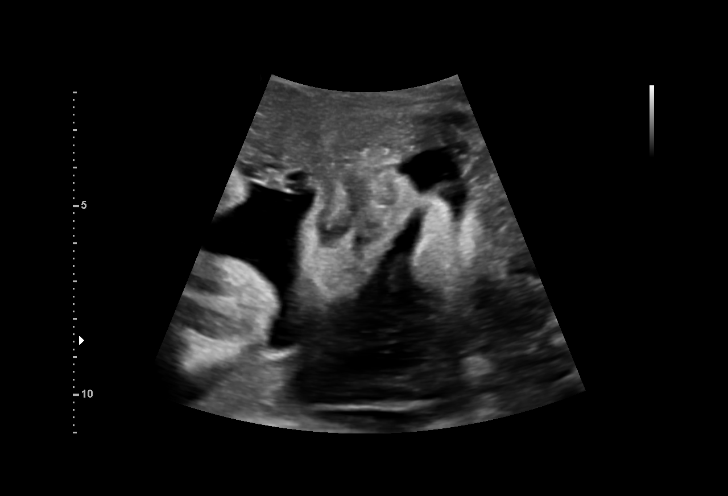
[im 33/63]
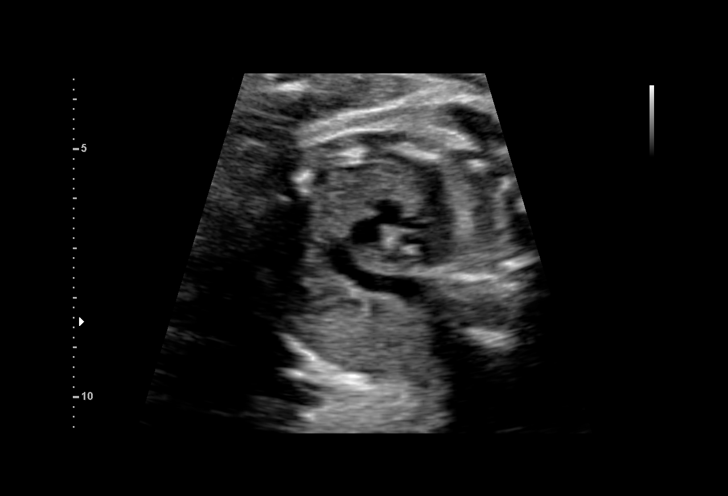
[im 37/63]
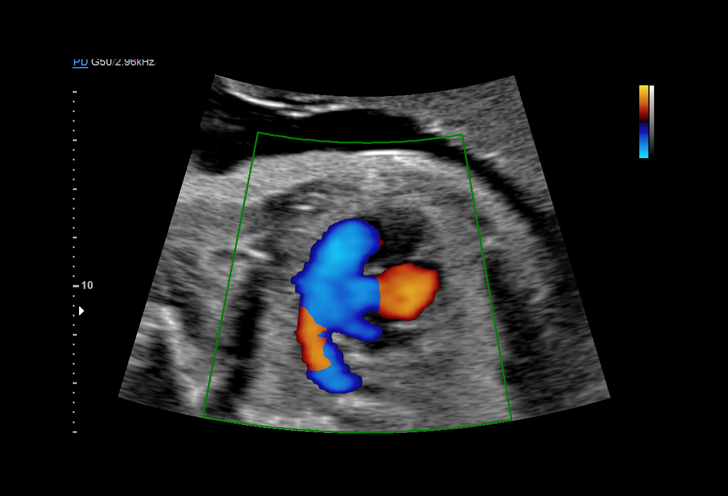
[im 42/63]
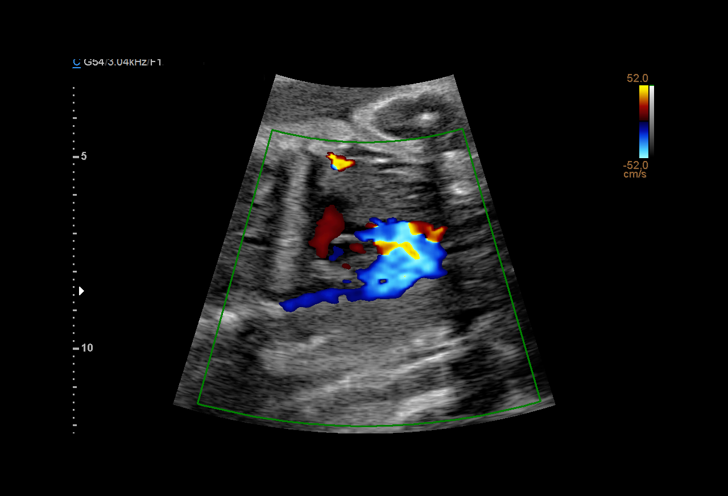
[im 46/63]
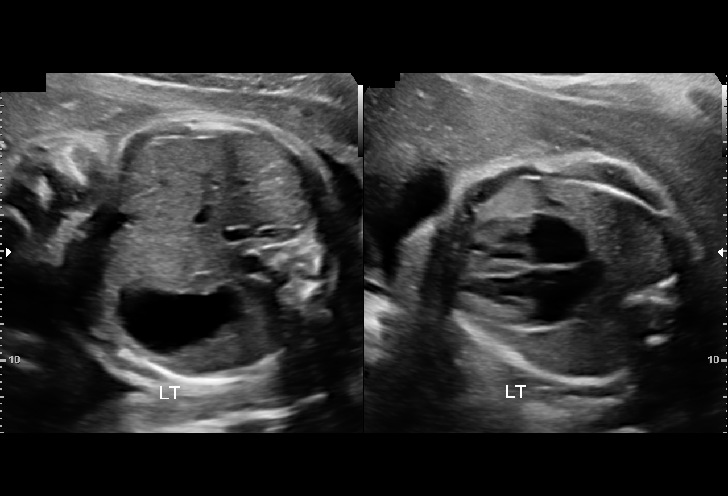
[im 51/63]
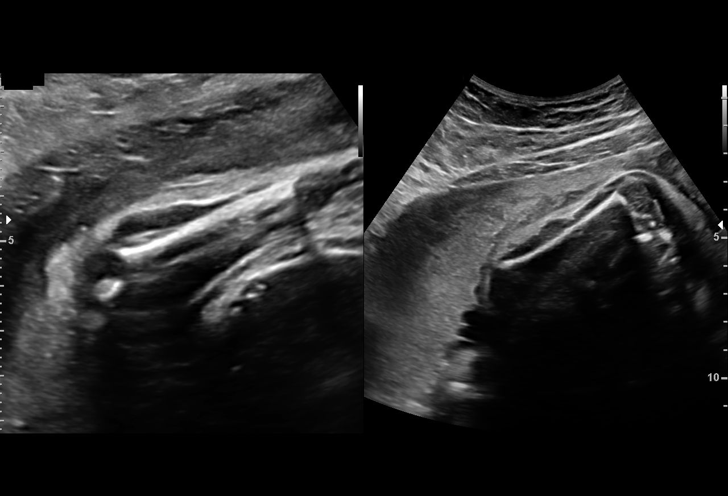
[im 56/63]
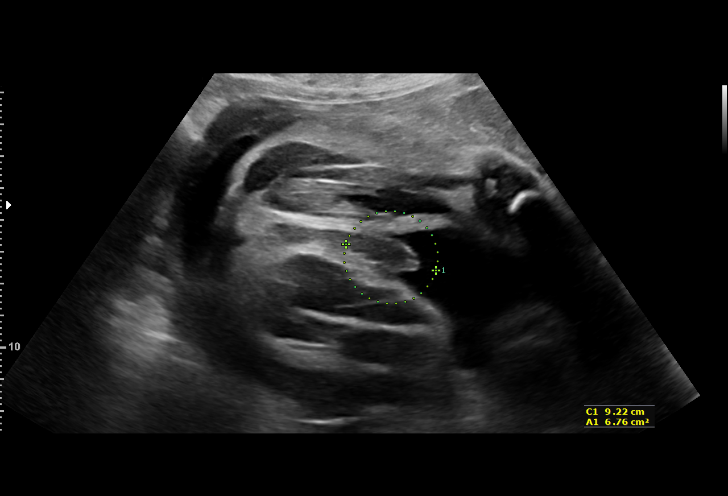
[im 60/63]
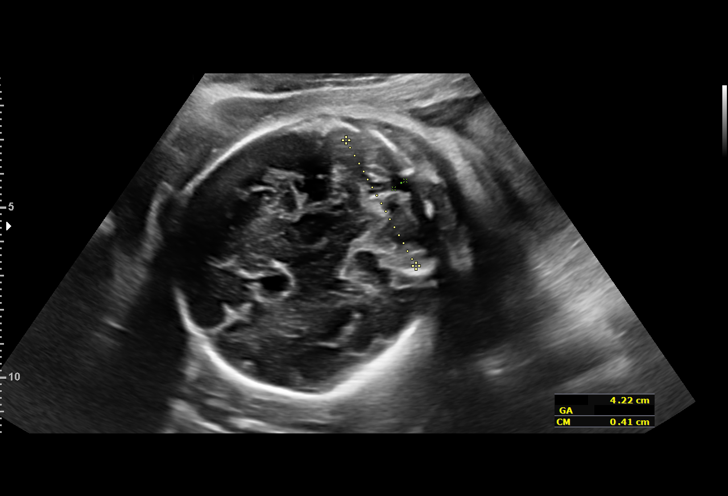

[13 of 28 positions shown; findings below may reference images not displayed]

CHAMBERS


 ----------------------------------------------------------------------

 ----------------------------------------------------------------------
Indications

  Insufficient Prenatal Care
  Obesity complicating pregnancy, third
  trimester
  Encounter for antenatal screening for
  malformations
  Small for gestational age fetus affecting
  management of mother
  32 weeks gestation of pregnancy
 ----------------------------------------------------------------------
Vital Signs

 BMI:
Fetal Evaluation

 Num Of Fetuses:         1
 Fetal Heart Rate(bpm):  146
 Cardiac Activity:       Observed
 Presentation:           Cephalic
 Placenta:               Anterior
 P. Cord Insertion:      Visualized, central

 Amniotic Fluid
 AFI FV:      Within normal limits

 AFI Sum(cm)     %Tile       Largest Pocket(cm)
 13.24           41          5

 RUQ(cm)       RLQ(cm)       LUQ(cm)        LLQ(cm)
 2.23          2.52          3.49           5
Biometry

 BPD:      79.9  mm     G. Age:  32w 0d         35  %    CI:        74.91   %    70 - 86
                                                         FL/HC:      19.6   %    19.1 -
 HC:      292.9  mm     G. Age:  32w 2d         17  %    HC/AC:      1.13        0.96 -
 AC:      259.6  mm     G. Age:  30w 1d          5  %    FL/BPD:     72.0   %    71 - 87
 FL:       57.5  mm     G. Age:  30w 1d        < 3  %    FL/AC:      22.1   %    20 - 24
 LV:        4.8  mm
 CM:        4.1  mm

 Est. FW:    8158  gm      3 lb 8 oz     23  %
OB History

 Gravidity:    1         Term:   0        Prem:   0        SAB:   0
 TOP:          0       Ectopic:  0        Living: 0
Gestational Age

 LMP:           32w 2d        Date:  07/28/18                 EDD:   05/04/19
 U/S Today:     31w 1d                                        EDD:   05/12/19
 Best:          32w 2d     Det. By:  LMP  (07/28/18)          EDD:   05/04/19
Anatomy

 Cranium:               Appears normal         Aortic Arch:            Appears normal
 Cavum:                 Appears normal         Ductal Arch:            Appears normal
 Ventricles:            Appears normal         Diaphragm:              Appears normal
 Choroid Plexus:        Appears normal         Stomach:                Appears normal, left
                                                                       sided
 Cerebellum:            Appears normal         Abdomen:                Appears normal
 Posterior Fossa:       Appears normal         Abdominal Wall:         Not well visualized
 Nuchal Fold:           Not applicable (>20    Cord Vessels:           Appears normal (3
                        wks GA)                                        vessel cord)
 Face:                  Orbits appear          Kidneys:                Appear normal
                        normal
 Lips:                  Appears normal         Bladder:                Appears normal
 Thoracic:              Appears normal         Spine:                  Not well visualized
 Heart:                 Appears normal         Upper Extremities:      Appears normal
                        (4CH, axis, and
                        situs)
 RVOT:                  Appears normal         Lower Extremities:      Appears normal
 LVOT:                  Appears normal

 Other:  Male gender.
Cervix Uterus Adnexa

 Cervix
 Not visualized (advanced GA >78wks)
Impression

 Late prenatal care. Patient is here for ultrasound evaluation.
 Fetal growth is appropriate for gestational age established by
 LMP date that is consistent with 6-week ultrasound.  Amniotic
 fluid is normal and good fetal activity is seen. Fetal anatomy
 appears normal, but limited by advanced gestational age.

Recommendations

 An appointment was made for her to return in 4 weeks for
 fetal growth assessment.
                 Vader, Daesik

## 2020-12-04 ENCOUNTER — Encounter: Payer: Self-pay | Admitting: Physician Assistant

## 2020-12-04 ENCOUNTER — Ambulatory Visit: Payer: Medicaid Other | Admitting: Physician Assistant

## 2020-12-04 ENCOUNTER — Other Ambulatory Visit: Payer: Self-pay

## 2020-12-04 DIAGNOSIS — B9689 Other specified bacterial agents as the cause of diseases classified elsewhere: Secondary | ICD-10-CM | POA: Diagnosis not present

## 2020-12-04 DIAGNOSIS — Z113 Encounter for screening for infections with a predominantly sexual mode of transmission: Secondary | ICD-10-CM

## 2020-12-04 DIAGNOSIS — N760 Acute vaginitis: Secondary | ICD-10-CM | POA: Diagnosis not present

## 2020-12-04 LAB — WET PREP FOR TRICH, YEAST, CLUE
Trichomonas Exam: NEGATIVE
Yeast Exam: NEGATIVE

## 2020-12-04 MED ORDER — METRONIDAZOLE 500 MG PO TABS: 500.0000 mg | ORAL_TABLET | Freq: Two times a day (BID) | ORAL | 0 refills | Status: AC

## 2020-12-04 NOTE — Progress Notes (Unsigned)
Pt is here for STI screenig.

## 2020-12-05 ENCOUNTER — Encounter: Payer: Self-pay | Admitting: Physician Assistant
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
## 2020-12-08 NOTE — Progress Notes (Signed)
Chart reviewed by Pharmacist  Suzanne Walker PharmD, Contract Pharmacist at [facility]  

## 2020-12-09 LAB — GONOCOCCUS CULTURE

## 2020-12-12 ENCOUNTER — Ambulatory Visit: Payer: Self-pay

## 2020-12-14 ENCOUNTER — Encounter: Payer: Self-pay | Admitting: Student

## 2020-12-14 LAB — HM HIV SCREENING LAB: HM HIV Screening: NEGATIVE

## 2022-01-27 DIAGNOSIS — J4520 Mild intermittent asthma, uncomplicated: Secondary | ICD-10-CM | POA: Insufficient documentation

## 2022-04-30 ENCOUNTER — Encounter: Payer: Self-pay | Admitting: Advanced Practice Midwife

## 2022-04-30 ENCOUNTER — Ambulatory Visit: Payer: Medicaid Other | Admitting: Nurse Practitioner

## 2022-04-30 ENCOUNTER — Ambulatory Visit: Payer: Medicaid Other

## 2022-04-30 DIAGNOSIS — Z113 Encounter for screening for infections with a predominantly sexual mode of transmission: Secondary | ICD-10-CM

## 2022-04-30 DIAGNOSIS — N6459 Other signs and symptoms in breast: Secondary | ICD-10-CM

## 2022-04-30 DIAGNOSIS — Z3200 Encounter for pregnancy test, result unknown: Secondary | ICD-10-CM

## 2022-04-30 LAB — WET PREP FOR TRICH, YEAST, CLUE
Trichomonas Exam: NEGATIVE
Yeast Exam: NEGATIVE

## 2022-04-30 LAB — HM HIV SCREENING LAB: HM HIV Screening: NEGATIVE

## 2022-04-30 LAB — PREGNANCY, URINE: Preg Test, Ur: NEGATIVE
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
## 2022-04-30 NOTE — Progress Notes (Signed)
Pt here for STI screening.  Wet mount reviewed, no treatment needed as per SO.  Condoms given.-Collins Scotland, RN

## 2022-05-04 LAB — GONOCOCCUS CULTURE

## 2022-06-17 ENCOUNTER — Telehealth: Payer: Self-pay | Admitting: *Deleted

## 2022-06-25 ENCOUNTER — Ambulatory Visit: Payer: Medicaid Other

## 2022-07-03 ENCOUNTER — Other Ambulatory Visit: Payer: Self-pay

## 2022-07-03 DIAGNOSIS — N6320 Unspecified lump in the left breast, unspecified quadrant: Secondary | ICD-10-CM

## 2022-07-08 ENCOUNTER — Ambulatory Visit: Payer: Medicaid Other | Attending: Hematology and Oncology | Admitting: *Deleted

## 2022-07-08 ENCOUNTER — Encounter: Payer: Self-pay | Admitting: *Deleted

## 2022-07-08 VITALS — BP 100/62 | Wt 181.1 lb

## 2022-07-08 DIAGNOSIS — N6312 Unspecified lump in the right breast, upper inner quadrant: Secondary | ICD-10-CM

## 2022-07-08 NOTE — Progress Notes (Signed)
[name] is a 26 y.o. female who presents to [facility] clinic today with complaint of a right breast mass.  Her mom is with her today.  Patient states she self palpated the mass approximately 2 months ago.  Per notes from the Health Department the mass was noted in the left breast.  Patient was seen by the FNP at the [facility] Department on 04/30/22 and a 2x2 cm nodule was palpated in the left breast.  Patient presents today via referral for further evaluation of the mass.   Pap Smear: Pap not smear completed today. Patient states her Last Pap smear was in 2022 at the [facility] clinic. Per patient has no history of an abnormal Pap smear. Last Pap smear result is not available in Epic.   Physical exam: Physical Exam Chest:  Breasts:    Right: Mass present. No swelling, bleeding, inverted nipple, nipple discharge, skin change or tenderness.     Left: No swelling, bleeding, inverted nipple, mass, nipple discharge, skin change or tenderness.    Patient had to point to the area of concern since there was no discrete mass  Pelvic/Bimanual Pap is not indicated today    Smoking History: Patient has is a former smoker     Patient Navigation: Patient education provided. Taught self breast awareness.  Access to services provided for patient through [facility] program. No interpreter provided. No transportation provided   Colorectal Cancer Screening: Per patient has never had colonoscopy completed. No complaints today.  Patient does not meet screening guidelines based on her age.   Breast and Cervical Cancer Risk Assessment: Patient has family history of breast cancer in her maternal grandmother, known genetic mutations, or radiation treatment to the chest before age 82. Patient does not have history of cervical dysplasia, immunocompromised, or DES exposure in-utero.  Risk Assessment   No risk assessment data     A: B[facility]CP exam without pap smear Complaint of a  left breast mass  P: Reviewed breast exam results with patient and explained possible benign findings.  Patient does complain of pain along the sternum.  Discussed costochondritis possibilities. Referred patient to the [facility] for an  ultrasound  . Appointment scheduled 07/11/22 @ 10:40.  If no findings on imaging encouraged patient to follow up with her PCP if she still has concerns.  Will follow up per [facility] protocol.  Jim Like, RN 07/08/2022 1:04 PM

## 2022-07-08 NOTE — Progress Notes (Deleted)
  Subjective:     Patient ID: Alyssa [name], female   DOB: 1996/05/21, 26 y.o.   MRN: [id number]  HPI   Review of Systems     Objective:   Physical Exam Chest:  Breasts:    Right: Mass present. No swelling, bleeding, inverted nipple, nipple discharge, skin change or tenderness.     Left: No swelling, bleeding, inverted nipple, mass, nipple discharge, skin change or tenderness.          Assessment:     ***    Plan:     ***

## 2022-07-11 ENCOUNTER — Ambulatory Visit: Admission: RE | Admit: 2022-07-11 | Payer: Medicaid Other | Source: Ambulatory Visit

## 2022-07-11 ENCOUNTER — Inpatient Hospital Stay: Admission: RE | Admit: 2022-07-11 | Payer: Medicaid Other | Source: Ambulatory Visit

## 2022-07-11 ENCOUNTER — Ambulatory Visit
Admission: RE | Admit: 2022-07-11 | Discharge: 2022-07-11 | Disposition: A | Discharge status: Home / Self Care | Payer: Medicaid Other | Source: Ambulatory Visit | Attending: Obstetrics and Gynecology | Admitting: Obstetrics and Gynecology

## 2022-07-11 DIAGNOSIS — N6312 Unspecified lump in the right breast, upper inner quadrant: Secondary | ICD-10-CM | POA: Insufficient documentation

## 2022-09-10 ENCOUNTER — Ambulatory Visit: Payer: Medicaid Other

## 2022-10-02 ENCOUNTER — Ambulatory Visit: Payer: Medicaid Other

## 2022-10-02 ENCOUNTER — Ambulatory Visit: Payer: Medicaid Other | Admitting: Advanced Practice Midwife

## 2022-10-02 ENCOUNTER — Encounter: Payer: Self-pay | Admitting: Advanced Practice Midwife

## 2022-10-02 DIAGNOSIS — Z113 Encounter for screening for infections with a predominantly sexual mode of transmission: Secondary | ICD-10-CM

## 2022-10-02 DIAGNOSIS — Z62810 Personal history of physical and sexual abuse in childhood: Secondary | ICD-10-CM | POA: Insufficient documentation

## 2022-10-02 HISTORY — DX: Personal history of physical and sexual abuse in childhood: Z62.810

## 2022-10-02 LAB — WET PREP FOR TRICH, YEAST, CLUE
Trichomonas Exam: NEGATIVE
Yeast Exam: NEGATIVE
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
## 2022-10-02 NOTE — Progress Notes (Signed)
Pt here for STI screening.  No symptoms.  Wet mount results reviewed with patient. No treatment needed as per standing orders.  Condoms Eloisa Northern, RN

## 2023-08-25 ENCOUNTER — Ambulatory Visit: Payer: Medicaid Other

## 2023-10-26 ENCOUNTER — Ambulatory Visit: Payer: Medicaid Other

## 2024-02-08 ENCOUNTER — Ambulatory Visit: Admitting: Nurse Practitioner

## 2024-02-08 ENCOUNTER — Encounter: Payer: Self-pay | Admitting: Nurse Practitioner

## 2024-02-08 VITALS — BP 109/78 | HR 67 | Ht 62.0 in | Wt 195.0 lb

## 2024-02-08 DIAGNOSIS — Z113 Encounter for screening for infections with a predominantly sexual mode of transmission: Secondary | ICD-10-CM

## 2024-02-08 DIAGNOSIS — Z01419 Encounter for gynecological examination (general) (routine) without abnormal findings: Secondary | ICD-10-CM | POA: Diagnosis not present

## 2024-02-08 DIAGNOSIS — Z3009 Encounter for other general counseling and advice on contraception: Secondary | ICD-10-CM

## 2024-02-08 DIAGNOSIS — Z309 Encounter for contraceptive management, unspecified: Secondary | ICD-10-CM

## 2024-02-08 LAB — WET PREP FOR TRICH, YEAST, CLUE
Trichomonas Exam: NEGATIVE
Yeast Exam: NEGATIVE

## 2024-02-08 MED ORDER — ELLA 30 MG PO TABS: 1.0000 | ORAL_TABLET | Freq: Once | ORAL | 0 refills | Status: AC

## 2024-02-08 NOTE — Progress Notes (Signed)
 Pt left before labs could be drawn or medication could be given.  Tried to call patient, phone number listed is disconnected.  Berdie Ogren, RN
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
## 2024-02-19 ENCOUNTER — Encounter: Payer: Self-pay | Admitting: Family Medicine

## 2024-02-20 LAB — IGP, RFX APTIMA HPV ASCU: PAP Smear Comment: 0

## 2024-02-20 LAB — HPV APTIMA: HPV Aptima: NEGATIVE

## 2024-02-21 NOTE — Progress Notes (Signed)
 ASCUS, HPV (-) with no history. Per ASCCP guidelines patient should repeat PAP in 3 years. Please send letter to patient. Thank you, Marylu Lund L. Marquesa Rath, FNP-C

## 2024-04-04 ENCOUNTER — Ambulatory Visit

## 2024-04-04 VITALS — BP 119/56 | Ht 62.0 in | Wt 192.0 lb

## 2024-04-04 DIAGNOSIS — Z309 Encounter for contraceptive management, unspecified: Secondary | ICD-10-CM

## 2024-04-04 DIAGNOSIS — Z3201 Encounter for pregnancy test, result positive: Secondary | ICD-10-CM

## 2024-04-04 MED ORDER — PRENATAL 27-0.8 MG PO TABS: 1.0000 | ORAL_TABLET | Freq: Every day | ORAL | Status: DC

## 2024-04-04 NOTE — Progress Notes (Signed)
 UPT positive. Positive preg packet given and reviewed. Unsure where she plans prenatal care. Local prenatal provider list given and reviewed. Advised to establish care soon.   The patient was dispensed prenatal vitamins #100 today per SO Dr Bohdan Bush. I provided counseling today regarding the medication. We discussed the medication, the side effects and when to call clinic. Patient given the opportunity to ask questions. Questions answered.    Sent to clerk for elig/medicaid/preg women. Crockett Rallo, RN

## 2024-04-05 LAB — PREGNANCY, URINE: Preg Test, Ur: POSITIVE — AB

## 2024-04-29 ENCOUNTER — Ambulatory Visit

## 2024-04-29 DIAGNOSIS — O30049 Twin pregnancy, dichorionic/diamniotic, unspecified trimester: Secondary | ICD-10-CM | POA: Insufficient documentation

## 2024-04-29 DIAGNOSIS — Z3689 Encounter for other specified antenatal screening: Secondary | ICD-10-CM

## 2024-04-29 DIAGNOSIS — Z3480 Encounter for supervision of other normal pregnancy, unspecified trimester: Secondary | ICD-10-CM | POA: Insufficient documentation

## 2024-04-29 DIAGNOSIS — Z3687 Encounter for antenatal screening for uncertain dates: Secondary | ICD-10-CM

## 2024-04-29 NOTE — Progress Notes (Signed)
 New OB Intake  I connected with  Alyssa [name] on 04/29/24 at  3:15 PM EDT by telephone and verified that I am speaking with the correct person using two identifiers. Nurse is located at [facility] and pt is located at home.  I discussed the limitations, risks, security and privacy concerns of performing an evaluation and management service by telephone and the availability of in person appointments. I also discussed with the patient that there may be a patient responsible charge related to this service. The patient expressed understanding and agreed to proceed.  I explained I am completing New OB Intake today. We discussed her EDD of 11/29/24 that is based on LMP of 02/23/24. Pt is G2/P1. I reviewed her allergies, medications, Medical/Surgical/OB history, and appropriate screenings. There are cats in the home: no. Based on history, this is a/an pregnancy uncomplicated . Her obstetrical history is significant for N/A.  Patient Active Problem List   Diagnosis Date Noted   Supervision of other normal pregnancy, antepartum 04/29/2024   Asthma, mild intermittent 01/27/2022   Morbid obesity (HCC) 180 lbs 06/28/2020   Marijuana use 06/28/2020   Cigar smoker 1.5 daily 06/28/2020   H/o pre-eclampsia 04/29/2019   Mood disorder (HCC) 01/21/2016    Concerns addressed today: None  Delivery Plans:  Unsure of delivering at [facility].  Anatomy US  Explained first scheduled US  will be soon. Anatomy US  will be scheduled around [redacted] weeks gestational age.  Labs Discussed genetic screening with patient. Patient undecided about genetic testing to be drawn at new OB visit. Discussed possible labs to be drawn at new OB appointment.  COVID Vaccine Patient has not had COVID vaccine.   Social Determinants of Health Food Insecurity: denies food insecurity Transportation: Patient expressed transportation needs. Childcare: Discussed no children allowed at ultrasound appointments.    First visit review I reviewed new OB appt with pt. I explained she will have blood work and pap smear/pelvic exam if indicated. Explained pt will be seen by Isa Manuel free, CNM at first visit; encounter routed to appropriate provider.   Higinio Love, CMA 04/29/2024  3:47 PM

## 2024-05-05 ENCOUNTER — Other Ambulatory Visit: Payer: Self-pay

## 2024-05-05 ENCOUNTER — Ambulatory Visit
Admission: RE | Admit: 2024-05-05 | Discharge: 2024-05-05 | Disposition: A | Discharge status: Home / Self Care | Source: Ambulatory Visit

## 2024-05-05 DIAGNOSIS — Z3687 Encounter for antenatal screening for uncertain dates: Secondary | ICD-10-CM

## 2024-05-05 DIAGNOSIS — Z3480 Encounter for supervision of other normal pregnancy, unspecified trimester: Secondary | ICD-10-CM

## 2024-05-10 ENCOUNTER — Other Ambulatory Visit (HOSPITAL_COMMUNITY)
Admission: RE | Admit: 2024-05-10 | Discharge: 2024-05-10 | Disposition: A | Discharge status: Home / Self Care | Source: Ambulatory Visit

## 2024-05-10 ENCOUNTER — Ambulatory Visit (INDEPENDENT_AMBULATORY_CARE_PROVIDER_SITE_OTHER)

## 2024-05-10 VITALS — BP 125/85 | HR 74 | Wt 181.0 lb

## 2024-05-10 DIAGNOSIS — Z3A11 11 weeks gestation of pregnancy: Secondary | ICD-10-CM

## 2024-05-10 DIAGNOSIS — O09891 Supervision of other high risk pregnancies, first trimester: Secondary | ICD-10-CM | POA: Diagnosis not present

## 2024-05-10 DIAGNOSIS — Z3480 Encounter for supervision of other normal pregnancy, unspecified trimester: Secondary | ICD-10-CM

## 2024-05-10 DIAGNOSIS — O1414 Severe pre-eclampsia complicating childbirth: Secondary | ICD-10-CM

## 2024-05-10 DIAGNOSIS — O30041 Twin pregnancy, dichorionic/diamniotic, first trimester: Secondary | ICD-10-CM | POA: Insufficient documentation

## 2024-05-10 DIAGNOSIS — Z8759 Personal history of other complications of pregnancy, childbirth and the puerperium: Secondary | ICD-10-CM | POA: Insufficient documentation

## 2024-05-10 DIAGNOSIS — O30049 Twin pregnancy, dichorionic/diamniotic, unspecified trimester: Secondary | ICD-10-CM | POA: Insufficient documentation

## 2024-05-10 MED ORDER — ONDANSETRON 4 MG PO TBDP: 4.0000 mg | ORAL_TABLET | Freq: Three times a day (TID) | ORAL | 4 refills | Status: DC | PRN

## 2024-05-10 MED ORDER — ASPIRIN 81 MG PO TBEC: 162.0000 mg | DELAYED_RELEASE_TABLET | Freq: Every day | ORAL | 2 refills | Status: DC

## 2024-05-10 NOTE — Addendum Note (Signed)
 Addended by: Abram Abraham on: 05/10/2024 03:22 PM   Modules accepted: Orders

## 2024-05-10 NOTE — Assessment & Plan Note (Signed)
 Routine prenatal care. We discussed an overview of prenatal care and when to call. Reviewed diet, exercise, and weight gain recommendations in pregnancy. Discussed benefits of breastfeeding and lactation resources at [facility]. I reviewed labs and answered all questions.

## 2024-05-10 NOTE — Assessment & Plan Note (Signed)
-   PIH labs collected. - Recommended LDASA, provided with Rx.
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
## 2024-05-10 NOTE — Assessment & Plan Note (Addendum)
>>  ASSESSMENT AND PLAN FOR DICHORIONIC DIAMNIOTIC TWIN GESTATION WRITTEN ON 05/10/2024  1:26 PM BY Alinna Siple J, CNM  - MFM referral for anatomy US .   >>ASSESSMENT AND PLAN FOR SUPERVISION OF OTHER NORMAL PREGNANCY, ANTEPARTUM WRITTEN ON 05/10/2024  1:28 PM BY Dekker Verga J, CNM  Routine prenatal care. We discussed an overview of prenatal care and when to call. Reviewed diet, exercise, and weight gain recommendations in pregnancy. Discussed benefits of breastfeeding and lactation resources at [facility]. I reviewed labs and answered all questions.

## 2024-05-11 LAB — CBC/D/PLT+RPR+RH+ABO+RUBIGG...
Antibody Screen: NEGATIVE
Basophils Absolute: 0 10*3/uL (ref 0.0–0.2)
Basos: 0 %
EOS (ABSOLUTE): 0.2 10*3/uL (ref 0.0–0.4)
Eos: 1 %
HCV Ab: NONREACTIVE
HIV Screen 4th Generation wRfx: NONREACTIVE
Hematocrit: 39.4 % (ref 34.0–46.6)
Hemoglobin: 13 g/dL (ref 11.1–15.9)
Hepatitis B Surface Ag: NEGATIVE
Immature Grans (Abs): 0 10*3/uL (ref 0.0–0.1)
Immature Granulocytes: 0 %
Lymphocytes Absolute: 3.1 10*3/uL (ref 0.7–3.1)
Lymphs: 28 %
MCH: 29.3 pg (ref 26.6–33.0)
MCHC: 33 g/dL (ref 31.5–35.7)
MCV: 89 fL (ref 79–97)
Monocytes Absolute: 0.9 10*3/uL (ref 0.1–0.9)
Monocytes: 9 %
Neutrophils Absolute: 6.6 10*3/uL (ref 1.4–7.0)
Neutrophils: 62 %
Platelets: 209 10*3/uL (ref 150–450)
RBC: 4.43 x10E6/uL (ref 3.77–5.28)
RDW: 12.4 % (ref 11.7–15.4)
RPR Ser Ql: NONREACTIVE
Rh Factor: POSITIVE
Rubella Antibodies, IGG: 13.9 {index} (ref >0.99)
Varicella zoster IgG: REACTIVE
WBC: 10.9 10*3/uL — ABNORMAL HIGH (ref 3.4–10.8)

## 2024-05-11 LAB — TSH+FREE T4
Free T4: 1.14 ng/dL (ref 0.82–1.77)
TSH: 0.186 u[IU]/mL — ABNORMAL LOW (ref 0.450–4.500)

## 2024-05-11 LAB — COMPREHENSIVE METABOLIC PANEL WITH GFR
ALT: 11 IU/L (ref 0–32)
AST: 11 IU/L (ref 0–40)
Albumin: 4 g/dL (ref 4.0–5.0)
Alkaline Phosphatase: 61 IU/L (ref 44–121)
BUN/Creatinine Ratio: 11 (ref 9–23)
BUN: 7 mg/dL (ref 6–20)
Bilirubin Total: <0.2 mg/dL (ref 0.0–1.2)
CO2: 16 mmol/L — ABNORMAL LOW (ref 20–29)
Calcium: 9.1 mg/dL (ref 8.7–10.2)
Chloride: 100 mmol/L (ref 96–106)
Creatinine, Ser: 0.63 mg/dL (ref 0.57–1.00)
Globulin, Total: 3 g/dL (ref 1.5–4.5)
Glucose: 81 mg/dL (ref 70–99)
Potassium: 4.3 mmol/L (ref 3.5–5.2)
Sodium: 134 mmol/L (ref 134–144)
Total Protein: 7 g/dL (ref 6.0–8.5)
eGFR: 125 mL/min/{1.73_m2} (ref >59)

## 2024-05-11 LAB — URINALYSIS, ROUTINE W REFLEX MICROSCOPIC
Bilirubin, UA: NEGATIVE
Glucose, UA: NEGATIVE
Nitrite, UA: NEGATIVE
RBC, UA: NEGATIVE
Specific Gravity, UA: 1.029 (ref 1.005–1.030)
Urobilinogen, Ur: 1 mg/dL (ref 0.2–1.0)
pH, UA: 7.5 (ref 5.0–7.5)

## 2024-05-11 LAB — MICROSCOPIC EXAMINATION: Casts: NONE SEEN /LPF

## 2024-05-11 LAB — PROTEIN / CREATININE RATIO, URINE
Creatinine, Urine: 314.9 mg/dL
Protein, Ur: 19.1 mg/dL
Protein/Creat Ratio: 61 mg/g{creat} (ref 0–200)

## 2024-05-11 LAB — HCV INTERPRETATION

## 2024-05-11 LAB — HEMOGLOBIN A1C
Est. average glucose Bld gHb Est-mCnc: 111 mg/dL
Hgb A1c MFr Bld: 5.5 % (ref 4.8–5.6)

## 2024-05-12 ENCOUNTER — Ambulatory Visit: Payer: Self-pay

## 2024-05-12 DIAGNOSIS — N3000 Acute cystitis without hematuria: Secondary | ICD-10-CM

## 2024-05-12 LAB — CERVICOVAGINAL ANCILLARY ONLY
Chlamydia: NEGATIVE
Comment: NEGATIVE
Comment: NORMAL
Neisseria Gonorrhea: NEGATIVE

## 2024-05-13 LAB — CULTURE, OB URINE

## 2024-05-13 LAB — URINE CULTURE, OB REFLEX

## 2024-05-14 LAB — MATERNIT 21 PLUS CORE, BLOOD
Fetal Fraction: 28
Result (T21): NEGATIVE
Trisomy 13 (Patau syndrome): NEGATIVE
Trisomy 18 (Edwards syndrome): NEGATIVE
Trisomy 21 (Down syndrome): NEGATIVE

## 2024-05-14 MED ORDER — NITROFURANTOIN MONOHYD MACRO 100 MG PO CAPS: 100.0000 mg | ORAL_CAPSULE | Freq: Two times a day (BID) | ORAL | 1 refills | Status: DC

## 2024-05-31 ENCOUNTER — Other Ambulatory Visit: Payer: Self-pay

## 2024-05-31 DIAGNOSIS — O1414 Severe pre-eclampsia complicating childbirth: Secondary | ICD-10-CM

## 2024-05-31 DIAGNOSIS — O30041 Twin pregnancy, dichorionic/diamniotic, first trimester: Secondary | ICD-10-CM

## 2024-05-31 DIAGNOSIS — Z8759 Personal history of other complications of pregnancy, childbirth and the puerperium: Secondary | ICD-10-CM

## 2024-05-31 NOTE — Progress Notes (Signed)
 US  MFM OB Detail +14 add'l gest order placed for Di/Di twin anatomy scan with MFM.

## 2024-06-07 ENCOUNTER — Telehealth: Payer: Self-pay | Admitting: Certified Nurse Midwife

## 2024-06-07 ENCOUNTER — Encounter: Admitting: Certified Nurse Midwife

## 2024-06-07 NOTE — Telephone Encounter (Signed)
 Pt was scheduled for a ROB appt on 06/07/2024 at 1:15 with DOROTHA Cisco.  Pt came late to the appt.  She rescheduled to 06/10/2024 at 8:55 with E. Slaughterbeck.

## 2024-06-08 ENCOUNTER — Emergency Department
Admission: EM | Admit: 2024-06-08 | Discharge: 2024-06-08 | Disposition: A | Discharge status: Home / Self Care | Attending: Emergency Medicine | Admitting: Emergency Medicine

## 2024-06-08 ENCOUNTER — Other Ambulatory Visit: Payer: Self-pay

## 2024-06-08 ENCOUNTER — Emergency Department

## 2024-06-08 DIAGNOSIS — Z3A15 15 weeks gestation of pregnancy: Secondary | ICD-10-CM | POA: Diagnosis not present

## 2024-06-08 DIAGNOSIS — O26892 Other specified pregnancy related conditions, second trimester: Secondary | ICD-10-CM | POA: Insufficient documentation

## 2024-06-08 DIAGNOSIS — R1031 Right lower quadrant pain: Secondary | ICD-10-CM | POA: Insufficient documentation

## 2024-06-08 DIAGNOSIS — R112 Nausea with vomiting, unspecified: Secondary | ICD-10-CM | POA: Diagnosis not present

## 2024-06-08 DIAGNOSIS — Z3490 Encounter for supervision of normal pregnancy, unspecified, unspecified trimester: Secondary | ICD-10-CM

## 2024-06-08 LAB — COMPREHENSIVE METABOLIC PANEL WITH GFR
ALT: 12 U/L (ref 0–44)
AST: 15 U/L (ref 15–41)
Albumin: 3.1 g/dL — ABNORMAL LOW (ref 3.5–5.0)
Alkaline Phosphatase: 51 U/L (ref 38–126)
Anion gap: 10 (ref 5–15)
BUN: 5 mg/dL — ABNORMAL LOW (ref 6–20)
CO2: 18 mmol/L — ABNORMAL LOW (ref 22–32)
Calcium: 8.9 mg/dL (ref 8.9–10.3)
Chloride: 104 mmol/L (ref 98–111)
Creatinine, Ser: 0.57 mg/dL (ref 0.44–1.00)
GFR, Estimated: >60 mL/min (ref >60)
Glucose, Bld: 80 mg/dL (ref 70–99)
Potassium: 3.4 mmol/L — ABNORMAL LOW (ref 3.5–5.1)
Sodium: 132 mmol/L — ABNORMAL LOW (ref 135–145)
Total Bilirubin: 0.6 mg/dL (ref 0.0–1.2)
Total Protein: 7.3 g/dL (ref 6.5–8.1)

## 2024-06-08 LAB — CBC
HCT: 35.6 % — ABNORMAL LOW (ref 36.0–46.0)
Hemoglobin: 12.1 g/dL (ref 12.0–15.0)
MCH: 30.3 pg (ref 26.0–34.0)
MCHC: 34 g/dL (ref 30.0–36.0)
MCV: 89 fL (ref 80.0–100.0)
Platelets: 220 K/uL (ref 150–400)
RBC: 4 MIL/uL (ref 3.87–5.11)
RDW: 12.8 % (ref 11.5–15.5)
WBC: 11.3 K/uL — ABNORMAL HIGH (ref 4.0–10.5)
nRBC: 0 % (ref 0.0–0.2)

## 2024-06-08 LAB — URINALYSIS, ROUTINE W REFLEX MICROSCOPIC
Bilirubin Urine: NEGATIVE
Glucose, UA: NEGATIVE mg/dL
Hgb urine dipstick: NEGATIVE
Ketones, ur: 80 mg/dL — AB
Leukocytes,Ua: NEGATIVE
Nitrite: NEGATIVE
Protein, ur: NEGATIVE mg/dL
Specific Gravity, Urine: 1.009 (ref 1.005–1.030)
pH: 7 (ref 5.0–8.0)

## 2024-06-08 LAB — POC URINE PREG, ED: Preg Test, Ur: POSITIVE — AB

## 2024-06-08 LAB — LIPASE, BLOOD: Lipase: 26 U/L (ref 11–51)

## 2024-06-08 MED ORDER — SODIUM CHLORIDE 0.9 % IV BOLUS
1000.0000 mL | Freq: Once | INTRAVENOUS | Status: AC
  Administered 2024-06-08: 1000 mL via INTRAVENOUS

## 2024-06-08 MED ORDER — METOCLOPRAMIDE HCL 10 MG PO TABS: 10.0000 mg | ORAL_TABLET | Freq: Three times a day (TID) | ORAL | 0 refills | Status: DC

## 2024-06-08 MED ORDER — METOCLOPRAMIDE HCL 10 MG PO TABS: 10.0000 mg | ORAL_TABLET | Freq: Three times a day (TID) | ORAL | 0 refills | Status: DC | PRN

## 2024-06-08 NOTE — ED Notes (Signed)
Unable to doppler FHT in Triage. 

## 2024-06-08 NOTE — ED Provider Notes (Signed)
 [facility] Provider Note    Event Date/Time   First MD Initiated Contact with Patient 06/08/24 1354     (approximate)   History   Abdominal Pain   HPI  Aleysha Hardrick-Chambers is a 28 y.o. female [redacted] weeks pregnant with twins presenting with lower abdominal pain, mostly on the right lower.  No fever or back pain.  No vaginal discharge or gush of fluid.  No vaginal bleeding.  She has had no issues with pregnancy so far.  Had an episode of nausea vomiting yesterday.  States no dysuria but she feels some pressure.  No prior abdominal surgeries.  On independent chart review, she had an OB ultrasound done on 12 June, showed viable twin pregnancy     Physical Exam   Triage Vital Signs: ED Triage Vitals  Encounter Vitals Group     BP 06/08/24 1320 129/68     Girls Systolic BP Percentile --      Girls Diastolic BP Percentile --      Boys Systolic BP Percentile --      Boys Diastolic BP Percentile --      Pulse Rate 06/08/24 1320 84     Resp 06/08/24 1320 16     Temp 06/08/24 1320 98.4 F (36.9 C)     Temp Source 06/08/24 1320 Oral     SpO2 06/08/24 1320 100 %     Weight 06/08/24 1318 181 lb (82.1 kg)     Height --      Head Circumference --      Peak Flow --      Pain Score 06/08/24 1318 6     Pain Loc --      Pain Education --      Exclude from Growth Chart --     Most recent vital signs: Vitals:   06/08/24 1320 06/08/24 1449  BP: 129/68   Pulse: 84   Resp: 16   Temp: 98.4 F (36.9 C) 99.2 F (37.3 C)  SpO2: 100% 100%     General: Awake, no distress.  CV:  Good peripheral perfusion.  Resp:  Normal effort.  Abd:  No distention.  Soft, tender at the right lower quadrant without guarding Other:  No CVA tenderness bilaterally   ED Results / Procedures / Treatments   Labs (all labs ordered are listed, but only abnormal results are displayed) Labs Reviewed  COMPREHENSIVE METABOLIC PANEL WITH GFR - Abnormal; Notable for the  following components:      Result Value   Sodium 132 (*)    Potassium 3.4 (*)    CO2 18 (*)    BUN 5 (*)    Albumin 3.1 (*)    All other components within normal limits  CBC - Abnormal; Notable for the following components:   WBC 11.3 (*)    HCT 35.6 (*)    All other components within normal limits  URINALYSIS, ROUTINE W REFLEX MICROSCOPIC - Abnormal; Notable for the following components:   Color, Urine YELLOW (*)    APPearance HAZY (*)    Ketones, ur 80 (*)    All other components within normal limits  POC URINE PREG, ED - Abnormal; Notable for the following components:   Preg Test, Ur Positive (*)    All other components within normal limits  LIPASE, BLOOD     RADIOLOGY On my independent interpretation, ultrasound OB shows twin A and twin B intrauterine pregnancy with positive fetal heart tones.  PROCEDURES:  Critical Care performed: No  Procedures   MEDICATIONS ORDERED IN ED: Medications  sodium chloride  0.9 % bolus 1,000 mL (1,000 mLs Intravenous New Bag/Given 06/08/24 1508)     IMPRESSION / MDM / ASSESSMENT AND PLAN / ED COURSE  I reviewed the triage vital signs and the nursing notes.                              Differential diagnosis includes, but is not limited to, appendicitis, nephrolithiasis, UTI, pelvic floor dysfunction, round ligament strain, [employer], threatened miscarriage.  Will get labs, UA, OB ultrasound, MR abd/pelvis.  Offered pain medications but patient declined at this time, also states that she is not nauseous so does not want any antiemetics.  Give her some fluids here.  Patient's presentation is most consistent with acute presentation with potential threat to life or bodily function.  Independent interpretation of labs and imaging below.  Patient signed out pending MR results.    Clinical Course as of 06/08/24 1520  Wed Jun 08, 2024  1450 Urinalysis, Routine w reflex microscopic -Urine, Clean Catch(!) Not consistent with UTI  [TT]  1450 Independent review of labs, mild leukocytosis, lipase is normal, electrolytes not severely deranged, LFTs are normal. [TT]    Clinical Course User Index [TT] Waymond, Lorelle Cummins, MD     FINAL CLINICAL IMPRESSION(S) / ED DIAGNOSES   Final diagnoses:  Right lower quadrant abdominal pain  Nausea and vomiting, unspecified vomiting type  Pregnancy, unspecified gestational age     Rx / DC Orders   ED Discharge Orders     None        Note:  This document was prepared using Dragon voice recognition software and may include unintentional dictation errors.    Waymond Lorelle Cummins, MD 06/08/24 1520

## 2024-06-08 NOTE — ED Triage Notes (Signed)
 Patient 15w 1 d pregnant with twins.  EDC:  11/29/2024. C?O 2 day history of lower abd pain and vaginal pain, worsening today.  C/O pain with urination.

## 2024-06-08 NOTE — Discharge Instructions (Addendum)
 They do not see obvious signs of appendicitis but if you develop increasing fevers over 100, worsening pain or any other concerns please return to the ER for repeat evaluation  IMPRESSION: Live twin intrauterine gestation. Fetus A has an average ultrasound age of 15 weeks, 3 days with a fetal heart rate of 145 beats per minute. Fetus B has an average ultrasound age of 14 weeks, 5 days with a fetal heart rate of 150 beats per minute.

## 2024-06-08 NOTE — ED Provider Notes (Addendum)
 3:34 PM Assumed care for off going team.   Blood pressure 129/68, pulse 84, temperature 99.2 F (37.3 C), temperature source Oral, resp. rate 16, weight 82.1 kg, last menstrual period 02/23/2024, SpO2 100%.  See their HPI for full report but in brief US /MRI --- [redacted] weeks pregnant with twins, RLQ- Abdominal pain, n, v    IMPRESSION: 1. Appendix is not clearly visualized. No inflammatory findings or other noncontrast CT findings to explain right lower quadrant pain. No hydronephrosis. 2. Gravid uterus. Dichorionic, diamniotic twin gestation. Posterior placentation of the superior gestation, anterior placentation of the inferior gestation. The fetuses are grossly normal on this examination which is not tailored for the assessment of fetal anatomy. 3. Trace free fluid in the low pelvis, likely physiologic in the setting of pregnancy.  IMPRESSION: Live twin intrauterine gestation. Fetus A has an average ultrasound age of 15 weeks, 3 days with a fetal heart rate of 145 beats per minute. Fetus B has an average ultrasound age of 14 weeks, 5 days with a fetal heart rate of 150 beats per minute.  7:41 PM abdominal exam soft and nontender we discussed return precautions if worsening pain, fevers but this time it is reassuring I even discussed with another radiologist just to confirm given there was a report of some noncontrast CT findings but even with the second radiologist he agree that where the appendix would be there is no signs for appendicitis and that the CT meant to say MRI.  Patient also just reports having all the STD testing wet prep done denies any issues with these and does not need to be repeated today  Patient feels comfortable discharge home tolerating p.o.  Did have episode of vomiting- she declines IV nausea meds says she wants to go home. This has been on going issue since being pregnant. She takes nothing at home. Discussed vitamin b6 and doxalymine. Declines any medication  here before going. Discussed importance of hydration with patient.    Ernest Ronal BRAVO, MD 06/08/24 1942    Ernest Ronal BRAVO, MD 06/08/24 1943    Ernest Ronal BRAVO, MD 06/08/24 2003

## 2024-06-08 NOTE — Progress Notes (Unsigned)
    Return Prenatal Note   Subjective   28 y.o. G2P1001 at [redacted]w[redacted]d presents for this follow-up prenatal visit.  Patient *** Patient reports:    Objective   Flow sheet Vitals:   Total weight gain: Not found.  General Appearance  No acute distress, well appearing, and well nourished Pulmonary   Normal work of breathing Neurologic   Alert and oriented to person, place, and time Psychiatric   Mood and affect within normal limits  Assessment/Plan   Plan  28 y.o. G2P1001 at [redacted]w[redacted]d presents for follow-up OB visit. Reviewed prenatal record including previous visit note.  No problem-specific Assessment & Plan notes found for this encounter.      No orders of the defined types were placed in this encounter.  No follow-ups on file.   Future Appointments  Date Time Provider Department Center  06/10/2024  8:55 AM Jahira Swiss, Damien, CNM [facility] None  07/05/2024  9:00 AM [facility] PROVIDER 1 [facility]  07/05/2024  9:30 AM [facility] US4 [facility]    For next visit:  {[email or web address]}     Rollo FORBES Louder, RN  07/16/258:23 AM

## 2024-06-10 ENCOUNTER — Ambulatory Visit: Admitting: Certified Nurse Midwife

## 2024-06-10 VITALS — BP 109/67 | HR 90 | Wt 180.0 lb

## 2024-06-10 DIAGNOSIS — R300 Dysuria: Secondary | ICD-10-CM

## 2024-06-10 DIAGNOSIS — Z3A15 15 weeks gestation of pregnancy: Secondary | ICD-10-CM | POA: Diagnosis not present

## 2024-06-10 DIAGNOSIS — Z3480 Encounter for supervision of other normal pregnancy, unspecified trimester: Secondary | ICD-10-CM

## 2024-06-10 DIAGNOSIS — O30042 Twin pregnancy, dichorionic/diamniotic, second trimester: Secondary | ICD-10-CM

## 2024-06-10 DIAGNOSIS — Z87440 Personal history of urinary (tract) infections: Secondary | ICD-10-CM

## 2024-06-10 LAB — POCT URINALYSIS DIPSTICK

## 2024-06-10 MED ORDER — NITROFURANTOIN MONOHYD MACRO 100 MG PO CAPS
100.0000 mg | ORAL_CAPSULE | Freq: Two times a day (BID) | ORAL | 1 refills | Status: DC
Start: 2024-06-10 — End: 2024-08-05

## 2024-06-10 NOTE — Assessment & Plan Note (Signed)
 Seen in ED for right sided abdominal pain. Continues with pain today that is intermittent. Reviewed round ligament pain comfort measures. Has MFM anatomy US  scheduled. Mat 21 printed and given to patient. She does not look at her MyChart. Discussion about labor with twins and what birth may look like. Reviewed higher risks of c/s and preterm birth when carrying multiples.  Reviewed red flag warning signs anticipatory guidance for upcoming prenatal care.

## 2024-06-24 DIAGNOSIS — O99210 Obesity complicating pregnancy, unspecified trimester: Secondary | ICD-10-CM | POA: Insufficient documentation

## 2024-07-05 ENCOUNTER — Ambulatory Visit: Attending: Certified Nurse Midwife | Admitting: Obstetrics and Gynecology

## 2024-07-05 ENCOUNTER — Other Ambulatory Visit: Payer: Self-pay | Admitting: *Deleted

## 2024-07-05 ENCOUNTER — Ambulatory Visit

## 2024-07-05 VITALS — BP 104/68

## 2024-07-05 DIAGNOSIS — E669 Obesity, unspecified: Secondary | ICD-10-CM

## 2024-07-05 DIAGNOSIS — Z3A19 19 weeks gestation of pregnancy: Secondary | ICD-10-CM | POA: Insufficient documentation

## 2024-07-05 DIAGNOSIS — O99212 Obesity complicating pregnancy, second trimester: Secondary | ICD-10-CM | POA: Diagnosis not present

## 2024-07-05 DIAGNOSIS — Z8759 Personal history of other complications of pregnancy, childbirth and the puerperium: Secondary | ICD-10-CM

## 2024-07-05 DIAGNOSIS — Z3480 Encounter for supervision of other normal pregnancy, unspecified trimester: Secondary | ICD-10-CM

## 2024-07-05 DIAGNOSIS — O30042 Twin pregnancy, dichorionic/diamniotic, second trimester: Secondary | ICD-10-CM

## 2024-07-05 DIAGNOSIS — O30041 Twin pregnancy, dichorionic/diamniotic, first trimester: Secondary | ICD-10-CM

## 2024-07-05 DIAGNOSIS — J45909 Unspecified asthma, uncomplicated: Secondary | ICD-10-CM | POA: Insufficient documentation

## 2024-07-05 DIAGNOSIS — O365920 Maternal care for other known or suspected poor fetal growth, second trimester, not applicable or unspecified: Secondary | ICD-10-CM

## 2024-07-05 DIAGNOSIS — O1414 Severe pre-eclampsia complicating childbirth: Secondary | ICD-10-CM

## 2024-07-05 DIAGNOSIS — O365922 Maternal care for other known or suspected poor fetal growth, second trimester, fetus 2: Secondary | ICD-10-CM

## 2024-07-05 DIAGNOSIS — O99210 Obesity complicating pregnancy, unspecified trimester: Secondary | ICD-10-CM

## 2024-07-05 DIAGNOSIS — O09293 Supervision of pregnancy with other poor reproductive or obstetric history, third trimester: Secondary | ICD-10-CM | POA: Insufficient documentation

## 2024-07-05 DIAGNOSIS — O09292 Supervision of pregnancy with other poor reproductive or obstetric history, second trimester: Secondary | ICD-10-CM

## 2024-07-05 DIAGNOSIS — O99512 Diseases of the respiratory system complicating pregnancy, second trimester: Secondary | ICD-10-CM | POA: Insufficient documentation

## 2024-07-05 NOTE — Progress Notes (Signed)
 Maternal-Fetal Medicine Consultation Name: Alyssa [name] MRN: [id number]  G2 P1001 at 19w gestation.  Patient is here for fetal anatomy scan. Dichorionic-diamniotic twin pregnancy was diagnosed at 10-week ultrasound.  This is a natural conception. On cell-free fetal DNA screening, the risks of fetal aneuploidy's are not increased. Patient reports no chronic medical conditions.  Blood pressure today at our office is 104/68 mmHg. Obstetrical history significant for a term vaginal delivery in 2020 of her female infant weighing 5 pounds and 9 ounces at birth.  Her pregnancy was complicated by fetal growth restriction and patient delivered at [redacted] weeks gestation.  Ultrasound Twin A: Maternal right, variable presentation, anterior placenta, female fetus.  Fetal biometry is consistent with the previously established dates.  No markers of aneuploidies or obvious fetal structural defects are seen.  Amniotic fluid is normal and good fetal activity seen.  Twin B: Upper fetus, transverse lie and head to maternal right, posterior placenta, female fetus.  Fetal biometry lags gestational age by 10 days.  No markers of aneuploidy's or obvious fetal structural defects are seen.  Amniotic fluid is normal and good fetal activity seen.  Biometry discordance: 28%.  On transabdominal scan, the cervix looks long and closed.  Our concerns include: Dichorionic-diamniotic twin pregnancy I explained the significance of chorionicity and its implications. Possible complications associated with twin pregnancy that include preterm labor/delivery (most common), fetal growth restriction of one or both twins, miscarriage, malpresentations and increased cesarean delivery rate, postpartum hemorrhage.  I also informed her that maternal complications including gestational diabetes and gestational hypertension/preeclampsia are more common. I discussed the mode of delivery that is based on the presentations.  If both have Vx/Vx  or Vx/non-vertex presentations, vaginal delivery may be considered. In Vx/non-vx, vaginal delivery followed by internal podalic version of second twin will achieve vaginal delivery. In non-vx first twin presentation, cesarean delivery will be performed.  I emphasized the importance of weight gain (24 lbs by 24 weeks) to improve fetal weight and decrease the chances of preterm delivery. Prophylactic low-dose aspirin  delays or prevents preeclampsia. Twin pregnancy is at high risk for preeclampsia.  Patient takes low-dose aspirin  prophylaxis.  Suspected fetal growth restriction in twin B Fetal biometry lags gestational age by 10 days.  However, it is too early to diagnose fetal growth restriction.  I explained the discrepancy in fetal weights and the possibility of developing fetal growth restriction in twin B.  I briefly discussed the possible causes including placental insufficiency and chromosomal anomalies. I discussed the significance and limitations of cell free fetal DNA screening and that only amniocentesis will give a definitive result on the fetal karyotype.  I explained that the procedure carries a small risk of miscarriage (1 and 500 procedures).  Patient will return in 3 weeks for fetal growth assessment   Recommendations: - An appointment was made for her to return in 3 weeks for fetal growth assessment. - Serial fetal growth assessments every 4 weeks. - Weekly antenatal testing based on the finding of fetal growth restriction.  Consultation including face-to-face (more than 50%) counseling 45 minutes.

## 2024-07-07 ENCOUNTER — Ambulatory Visit (INDEPENDENT_AMBULATORY_CARE_PROVIDER_SITE_OTHER): Admitting: Obstetrics

## 2024-07-07 VITALS — BP 118/72 | HR 119 | Wt 177.0 lb

## 2024-07-07 DIAGNOSIS — O1414 Severe pre-eclampsia complicating childbirth: Secondary | ICD-10-CM

## 2024-07-07 DIAGNOSIS — E669 Obesity, unspecified: Secondary | ICD-10-CM | POA: Diagnosis not present

## 2024-07-07 DIAGNOSIS — O99212 Obesity complicating pregnancy, second trimester: Secondary | ICD-10-CM

## 2024-07-07 DIAGNOSIS — Z3A19 19 weeks gestation of pregnancy: Secondary | ICD-10-CM

## 2024-07-07 DIAGNOSIS — O30042 Twin pregnancy, dichorionic/diamniotic, second trimester: Secondary | ICD-10-CM

## 2024-07-07 DIAGNOSIS — Z87440 Personal history of urinary (tract) infections: Secondary | ICD-10-CM

## 2024-07-07 DIAGNOSIS — O99210 Obesity complicating pregnancy, unspecified trimester: Secondary | ICD-10-CM

## 2024-07-07 DIAGNOSIS — O365922 Maternal care for other known or suspected poor fetal growth, second trimester, fetus 2: Secondary | ICD-10-CM | POA: Insufficient documentation

## 2024-07-07 LAB — POCT URINALYSIS DIPSTICK
Bilirubin, UA: NEGATIVE
Blood, UA: NEGATIVE
Glucose, UA: NEGATIVE
Ketones, UA: NEGATIVE
Leukocytes, UA: NEGATIVE
Nitrite, UA: NEGATIVE
Protein, UA: POSITIVE — AB
Spec Grav, UA: 1.01 (ref 1.010–1.025)
Urobilinogen, UA: 0.2 U/dL
pH, UA: 5.5 (ref 5.0–8.0)

## 2024-07-07 NOTE — Progress Notes (Signed)
    Return Prenatal Note   Subjective  28 y.o. G2P1001 at [redacted]w[redacted]d presents for this follow-up prenatal visit. Pregnancy notable for di-di twins with suspected FGR of twin B, untreated UTI, and hx of preeclampsia and FGR in G1.   UTI dx 05/10/24 and pt instructed to take abx which she has not, prescribed on 6/17 and 7/16. Patient is having pelvic pain and pressure.  She wonders if there is anything we can prescribe to help with this besides antibiotics.  She has lost 3 lbs since last visit  Patient reports: Movement: Present Contractions: Not present Denies vaginal bleeding or leaking fluid. Objective  Flow sheet Vitals: Pulse Rate: (!) 119 BP: 118/72 Fundal Height: 21 cm Fetal Heart Rate (bpm): 148/145 Total weight gain: -3 lb (-1.361 kg)  General Appearance  No acute distress, well appearing, and well nourished Pulmonary   Normal work of breathing Neurologic   Alert and oriented to person, place, and time Psychiatric   Mood and affect within normal limits   Assessment/Plan   Plan  28 y.o. G2P1001 at [redacted]w[redacted]d with di-di twins presents for follow-up OB visit. Reviewed prenatal record including previous visit note.  1. Dichorionic diamniotic twin pregnancy in second trimester (Primary) -Suspected FGR of twin B <1%ile.  -Growth Q3-4wks per MFM, weekly surveillance  2. Obesity affecting pregnancy, antepartum, unspecified obesity type -Is down 3# to date, counseled on adequate nutrition/hydration -Weight gain goal: 11-20lbs  3. History of UTI -UTI dx 6/17 and pt has not taken abx twice after counseling, fear of swallowing pills -UA today looks normal, discussed possibility that she did clear on her own; will repeat culture today and if still present, she agrees to take abx.   Orders Placed This Encounter  Procedures   Urine Culture   POCT urinalysis dipstick   Return in about 4 weeks (around 08/04/2024) for ROB.   Future Appointments  Date Time Provider Department Center   08/04/2024 10:35 AM Janit Alm Agent, MD [facility] None    For next visit:  continue with routine prenatal care   Estil Mangle, DO [facility]

## 2024-07-10 LAB — URINE CULTURE

## 2024-07-11 ENCOUNTER — Ambulatory Visit: Payer: Self-pay | Admitting: Obstetrics

## 2024-07-13 ENCOUNTER — Encounter: Payer: Self-pay | Admitting: Certified Nurse Midwife

## 2024-07-15 ENCOUNTER — Telehealth: Payer: Self-pay

## 2024-07-19 ENCOUNTER — Other Ambulatory Visit: Payer: Self-pay | Admitting: *Deleted

## 2024-07-19 DIAGNOSIS — O30042 Twin pregnancy, dichorionic/diamniotic, second trimester: Secondary | ICD-10-CM

## 2024-07-19 DIAGNOSIS — O365922 Maternal care for other known or suspected poor fetal growth, second trimester, fetus 2: Secondary | ICD-10-CM

## 2024-07-29 ENCOUNTER — Ambulatory Visit (HOSPITAL_BASED_OUTPATIENT_CLINIC_OR_DEPARTMENT_OTHER): Admitting: Obstetrics

## 2024-07-29 ENCOUNTER — Other Ambulatory Visit: Payer: Self-pay | Admitting: Obstetrics and Gynecology

## 2024-07-29 ENCOUNTER — Ambulatory Visit: Attending: Obstetrics and Gynecology

## 2024-07-29 ENCOUNTER — Other Ambulatory Visit: Payer: Self-pay | Admitting: *Deleted

## 2024-07-29 VITALS — BP 126/81 | HR 95

## 2024-07-29 DIAGNOSIS — O30042 Twin pregnancy, dichorionic/diamniotic, second trimester: Secondary | ICD-10-CM

## 2024-07-29 DIAGNOSIS — O34219 Maternal care for unspecified type scar from previous cesarean delivery: Secondary | ICD-10-CM

## 2024-07-29 DIAGNOSIS — O365930 Maternal care for other known or suspected poor fetal growth, third trimester, not applicable or unspecified: Secondary | ICD-10-CM

## 2024-07-29 DIAGNOSIS — O365920 Maternal care for other known or suspected poor fetal growth, second trimester, not applicable or unspecified: Secondary | ICD-10-CM | POA: Insufficient documentation

## 2024-07-29 DIAGNOSIS — Z3A33 33 weeks gestation of pregnancy: Secondary | ICD-10-CM | POA: Diagnosis not present

## 2024-07-29 DIAGNOSIS — Z3A22 22 weeks gestation of pregnancy: Secondary | ICD-10-CM | POA: Insufficient documentation

## 2024-07-29 DIAGNOSIS — O43192 Other malformation of placenta, second trimester: Secondary | ICD-10-CM

## 2024-07-29 DIAGNOSIS — O365922 Maternal care for other known or suspected poor fetal growth, second trimester, fetus 2: Secondary | ICD-10-CM

## 2024-07-29 NOTE — Progress Notes (Signed)
 MFM Consult Note  Alyssa [name] is currently at 22 weeks and 3 days.  She has been followed due to a dichorionic, diamniotic twin gestation.  IUGR of twin B noted on her prior ultrasound exam.    She denies any problems since her last exam and reports feeling fetal movements of both fetuses throughout the day.    She had a cell free DNA test drawn earlier in her pregnancy which indicated a low risk for trisomy 30, 74, and 13.  These are dizygotic (fraternal) twins as a female and female fetus were noted.  Twin A: EFW 1 pound 2 ounces (41st percentile for her gestational age). Normal amniotic fluid.  Doppler studies of the umbilical arteries showed continued normal forward flow.  There were no signs of absent or reversed end-diastolic flow noted.    Twin B: EFW 13 ounces (2nd percentile for her gestational age).  Normal amniotic fluid.  Doppler studies of the umbilical arteries showed continued normal forward flow.  There were no signs of absent or reversed end-diastolic flow noted.    Both fetuses have shown increases in weight compared to her prior ultrasound exam.  The patient was reassured that IUGR is a common finding.  Most cases of IUGR result in the delivery of a healthy infant at or close to term.  Due to IUGR of twin B, we will continue to follow her with frequent ultrasound exams for fetal assessment.    She will return in 3 weeks for another growth scan.  Should IUGR continue to be noted at her next growth scan, we will start weekly NSTs at 28 weeks.  The patient stated that all of her questions were answered today.  A total of 30 minutes was spent counseling and coordinating the care for this patient.  Greater than 50% of the time was spent in direct face-to-face contact.

## 2024-08-04 ENCOUNTER — Encounter: Admitting: Obstetrics and Gynecology

## 2024-08-05 ENCOUNTER — Other Ambulatory Visit: Payer: Self-pay | Admitting: *Deleted

## 2024-08-05 ENCOUNTER — Ambulatory Visit: Admitting: Obstetrics

## 2024-08-05 ENCOUNTER — Encounter: Payer: Self-pay | Admitting: Obstetrics

## 2024-08-05 VITALS — BP 113/74 | HR 96 | Wt 181.1 lb

## 2024-08-05 DIAGNOSIS — O365920 Maternal care for other known or suspected poor fetal growth, second trimester, not applicable or unspecified: Secondary | ICD-10-CM

## 2024-08-05 DIAGNOSIS — Z3A23 23 weeks gestation of pregnancy: Secondary | ICD-10-CM | POA: Diagnosis not present

## 2024-08-05 DIAGNOSIS — O43192 Other malformation of placenta, second trimester: Secondary | ICD-10-CM

## 2024-08-05 DIAGNOSIS — O30042 Twin pregnancy, dichorionic/diamniotic, second trimester: Secondary | ICD-10-CM

## 2024-08-05 DIAGNOSIS — O365922 Maternal care for other known or suspected poor fetal growth, second trimester, fetus 2: Secondary | ICD-10-CM

## 2024-08-05 DIAGNOSIS — Z130 Encounter for screening for diseases of the blood and blood-forming organs and certain disorders involving the immune mechanism: Secondary | ICD-10-CM

## 2024-08-05 DIAGNOSIS — O99210 Obesity complicating pregnancy, unspecified trimester: Secondary | ICD-10-CM

## 2024-08-05 DIAGNOSIS — Z113 Encounter for screening for infections with a predominantly sexual mode of transmission: Secondary | ICD-10-CM

## 2024-08-05 DIAGNOSIS — Z131 Encounter for screening for diabetes mellitus: Secondary | ICD-10-CM

## 2024-08-05 DIAGNOSIS — O365921 Maternal care for other known or suspected poor fetal growth, second trimester, fetus 1: Secondary | ICD-10-CM

## 2024-08-05 DIAGNOSIS — O1414 Severe pre-eclampsia complicating childbirth: Secondary | ICD-10-CM

## 2024-08-05 DIAGNOSIS — O0992 Supervision of high risk pregnancy, unspecified, second trimester: Secondary | ICD-10-CM

## 2024-08-05 NOTE — Assessment & Plan Note (Signed)
-  Per discussion with Dr. Arna (MFM) today, will assess fetal growth again 3 wks from her last visit/ 2 wks from now. Further POC pending those results.

## 2024-08-05 NOTE — Assessment & Plan Note (Signed)
-  Growth scan again in 2 wks from now with MFM. Will continue routine prenatal care for now.

## 2024-08-05 NOTE — Progress Notes (Signed)
    Return Prenatal Note   Subjective   28 y.o. G2P1001 at [redacted]w[redacted]d presents for this follow-up prenatal visit.  Patient is feeling tired today, but generally well. Waiting to hear from MFM re: scheduling next visit and growth scan.  Patient reports: Movement: Present (Feels both babies moving.); feeling both babies move.  Objective   Flow sheet Vitals: Pulse Rate: 96 BP: 113/74 Fetal Heart Rate (bpm): 145/155 Total weight gain: 1 lb 1.6 oz (0.499 kg)  General Appearance  No acute distress, well appearing, and well nourished Pulmonary   Normal work of breathing Neurologic   Alert and oriented to person, place, and time Psychiatric   Mood and affect within normal limits   Assessment/Plan   Plan  28 y.o. G2P1001 at [redacted]w[redacted]d presents for follow-up OB visit. Reviewed prenatal record including previous visit note.  IUGR (intrauterine growth restriction) affecting care of mother, second trimester, fetus 2 -Per discussion with Dr. Arna (MFM) today, will assess fetal growth again 3 wks from her last visit/ 2 wks from now. Further POC pending those results.   Dichorionic diamniotic twin gestation -Growth scan again in 2 wks from now with MFM. Will continue routine prenatal care for now.  -Pt awaiting call from MFM to schedule this appointment. They are aware it needs to be scheduled.      Orders Placed This Encounter  Procedures   28 Week RH+Panel    Standing Status:   Future    Expiration Date:   08/01/2025   Vitamin B12    Standing Status:   Future    Expected Date:   09/02/2024    Expiration Date:   12/01/2024   Folate    Standing Status:   Future    Expected Date:   09/02/2024    Expiration Date:   12/01/2024   Iron and TIBC    Standing Status:   Future    Expected Date:   09/02/2024    Expiration Date:   12/01/2024   Ferritin    Standing Status:   Future    Expected Date:   09/02/2024    Expiration Date:   12/01/2024   Return in about 4 weeks (around 09/02/2024) for ROB  with labs.   No future appointments.  For next visit:  continue with routine prenatal care     Charma DOMINO, CNM  08/05/2509:14 AM

## 2024-08-24 ENCOUNTER — Ambulatory Visit

## 2024-08-24 ENCOUNTER — Ambulatory Visit (HOSPITAL_BASED_OUTPATIENT_CLINIC_OR_DEPARTMENT_OTHER)

## 2024-08-24 ENCOUNTER — Observation Stay
Admission: RE | Admit: 2024-08-24 | Discharge: 2024-08-24 | Disposition: A | Discharge status: Home / Self Care | Attending: Obstetrics | Admitting: Obstetrics

## 2024-08-24 ENCOUNTER — Other Ambulatory Visit: Payer: Self-pay

## 2024-08-24 DIAGNOSIS — O43192 Other malformation of placenta, second trimester: Secondary | ICD-10-CM | POA: Insufficient documentation

## 2024-08-24 DIAGNOSIS — O99512 Diseases of the respiratory system complicating pregnancy, second trimester: Secondary | ICD-10-CM | POA: Diagnosis not present

## 2024-08-24 DIAGNOSIS — J45909 Unspecified asthma, uncomplicated: Secondary | ICD-10-CM

## 2024-08-24 DIAGNOSIS — O26892 Other specified pregnancy related conditions, second trimester: Secondary | ICD-10-CM | POA: Diagnosis present

## 2024-08-24 DIAGNOSIS — Z3A26 26 weeks gestation of pregnancy: Secondary | ICD-10-CM

## 2024-08-24 DIAGNOSIS — O365921 Maternal care for other known or suspected poor fetal growth, second trimester, fetus 1: Secondary | ICD-10-CM | POA: Insufficient documentation

## 2024-08-24 DIAGNOSIS — O09292 Supervision of pregnancy with other poor reproductive or obstetric history, second trimester: Secondary | ICD-10-CM | POA: Insufficient documentation

## 2024-08-24 DIAGNOSIS — O30042 Twin pregnancy, dichorionic/diamniotic, second trimester: Principal | ICD-10-CM | POA: Insufficient documentation

## 2024-08-24 DIAGNOSIS — O99212 Obesity complicating pregnancy, second trimester: Secondary | ICD-10-CM | POA: Insufficient documentation

## 2024-08-24 DIAGNOSIS — O99891 Other specified diseases and conditions complicating pregnancy: Secondary | ICD-10-CM | POA: Insufficient documentation

## 2024-08-24 DIAGNOSIS — R102 Pelvic and perineal pain unspecified side: Secondary | ICD-10-CM | POA: Insufficient documentation

## 2024-08-24 DIAGNOSIS — O365922 Maternal care for other known or suspected poor fetal growth, second trimester, fetus 2: Secondary | ICD-10-CM

## 2024-08-24 DIAGNOSIS — O365920 Maternal care for other known or suspected poor fetal growth, second trimester, not applicable or unspecified: Secondary | ICD-10-CM | POA: Diagnosis not present

## 2024-08-24 DIAGNOSIS — B3731 Acute candidiasis of vulva and vagina: Secondary | ICD-10-CM | POA: Insufficient documentation

## 2024-08-24 DIAGNOSIS — R109 Unspecified abdominal pain: Secondary | ICD-10-CM | POA: Insufficient documentation

## 2024-08-24 LAB — URINALYSIS, ROUTINE W REFLEX MICROSCOPIC
Bilirubin Urine: NEGATIVE
Glucose, UA: NEGATIVE mg/dL
Hgb urine dipstick: NEGATIVE
Ketones, ur: NEGATIVE mg/dL
Leukocytes,Ua: NEGATIVE
Nitrite: NEGATIVE
Protein, ur: NEGATIVE mg/dL
Specific Gravity, Urine: 1.021 (ref 1.005–1.030)
pH: 6 (ref 5.0–8.0)

## 2024-08-24 LAB — CHLAMYDIA/NGC RT PCR (ARMC ONLY)
Chlamydia Tr: NOT DETECTED
N gonorrhoeae: NOT DETECTED

## 2024-08-24 LAB — WET PREP, GENITAL
Clue Cells Wet Prep HPF POC: NONE SEEN
Sperm: NONE SEEN
Trich, Wet Prep: NONE SEEN
WBC, Wet Prep HPF POC: <10 (ref <10)
Yeast Wet Prep HPF POC: POSITIVE — AB

## 2024-08-24 MED ORDER — FLUCONAZOLE 150 MG PO TABS: 150.0000 mg | ORAL_TABLET | Freq: Once | ORAL | 0 refills | Status: AC

## 2024-08-24 MED ORDER — HYDROXYZINE HCL 25 MG PO TABS: 50.0000 mg | ORAL_TABLET | Freq: Four times a day (QID) | ORAL | Status: DC | PRN

## 2024-08-24 MED ORDER — ONDANSETRON HCL 4 MG/2ML IJ SOLN: 4.0000 mg | Freq: Four times a day (QID) | INTRAMUSCULAR | Status: DC | PRN

## 2024-08-24 MED ORDER — LACTATED RINGERS IV SOLN: 500.0000 mL | INTRAVENOUS | Status: DC | PRN

## 2024-08-24 MED ORDER — FLUCONAZOLE 50 MG PO TABS
150.0000 mg | ORAL_TABLET | Freq: Once | ORAL | Status: AC
  Administered 2024-08-24: 150 mg via ORAL
  Filled 2024-08-24: qty 1

## 2024-08-24 MED ORDER — ACETAMINOPHEN 325 MG PO TABS: 650.0000 mg | ORAL_TABLET | ORAL | Status: DC | PRN

## 2024-08-24 MED ORDER — SOD CITRATE-CITRIC ACID 500-334 MG/5ML PO SOLN: 30.0000 mL | ORAL | Status: DC | PRN

## 2024-08-24 MED ORDER — CALCIUM CARBONATE ANTACID 500 MG PO CHEW: 2.0000 | CHEWABLE_TABLET | ORAL | Status: DC | PRN

## 2024-08-24 NOTE — OB Triage Note (Signed)
 Patient sent from perinatal clinic appointment for evaluation. Alyssa [name] complains of abdominal pain pressure in the vaginal area that started this morning. Denies any vaginal bleeding, leaking of fluid or decreased fetal movement.

## 2024-08-24 NOTE — OB Triage Provider Note (Signed)
 LABOR & DELIVERY OB TRIAGE NOTE  SUBJECTIVE  HPI Alyssa [name] is a 28 y.o. G2P1001 at [redacted]w[redacted]d who presents to Labor & Delivery for vaginal pain/pressure and abdominal pain. She was at her MFM appt but was sent to triage to evaluate. She reports that her vagina hurts and she feels pelvic pressure. She notes a thick white discharge and denies vaginal bleeding. She reports urinary frequency without urgency or dysuria. She denies contractions beside mild BH. She endorses good fetal movement. She did not feel any contractions while in triage and was able to sleep.  OB History     Gravida  2   Para  1   Term  1   Preterm  0   AB  0   Living  1      SAB  0   IAB  0   Ectopic  0   Multiple  0   Live Births  1           Scheduled Meds:  fluconazole   150 mg Oral Once   Continuous Infusions:  lactated ringers      PRN Meds:.acetaminophen , calcium carbonate, hydrOXYzine, lactated ringers , ondansetron , sodium citrate-citric acid   OBJECTIVE  BP 111/76   Pulse 77   Temp 98.2 F (36.8 C) (Oral)   Resp 16   LMP 02/23/2024 (Exact Date)   General: alert, cooperative, NAD Abdomen: soft, gravid, non-tender, no suprapubic tenderness SSE: cervix visually closed, thick  Wet prep: yeast UA:  unremarkable GC/CT: pending  NST I reviewed the NST and it was reactive.  Fetus A Baseline: 135 Variability: moderate Accelerations: present Decelerations:none  Fetus B Baseline: 140 Variability: moderate Accelerations: prsent (10x10) Decelerations:none Toco: no ctx Category 1  ASSESSMENT Impression  1) Twin pregnancy at G2P1001, [redacted]w[redacted]d, Estimated Date of Delivery: 11/29/24 2) Reassuring maternal/fetal status 3) Vulvovaginal candidiasis  PLAN 1) Diflucan  150 mg PO x 1. May repeat dose in 3 days if still symptomatic (rx sent) 2) Will return to MFM for scheduled appt 3) Keep scheduled ROB appt  Eleanor Canny, CNM 08/24/24  11:15 AM

## 2024-08-25 LAB — URINE CULTURE: Culture: 50000 — AB

## 2024-08-29 ENCOUNTER — Ambulatory Visit: Payer: Self-pay | Admitting: Obstetrics

## 2024-08-30 ENCOUNTER — Ambulatory Visit (HOSPITAL_BASED_OUTPATIENT_CLINIC_OR_DEPARTMENT_OTHER): Admitting: Obstetrics and Gynecology

## 2024-08-30 ENCOUNTER — Other Ambulatory Visit: Payer: Self-pay | Admitting: Maternal & Fetal Medicine

## 2024-08-30 ENCOUNTER — Ambulatory Visit: Attending: Obstetrics and Gynecology

## 2024-08-30 VITALS — BP 117/69

## 2024-08-30 DIAGNOSIS — E669 Obesity, unspecified: Secondary | ICD-10-CM

## 2024-08-30 DIAGNOSIS — O99212 Obesity complicating pregnancy, second trimester: Secondary | ICD-10-CM

## 2024-08-30 DIAGNOSIS — O365922 Maternal care for other known or suspected poor fetal growth, second trimester, fetus 2: Secondary | ICD-10-CM

## 2024-08-30 DIAGNOSIS — O365920 Maternal care for other known or suspected poor fetal growth, second trimester, not applicable or unspecified: Secondary | ICD-10-CM | POA: Diagnosis not present

## 2024-08-30 DIAGNOSIS — Z3A27 27 weeks gestation of pregnancy: Secondary | ICD-10-CM

## 2024-08-30 DIAGNOSIS — O365921 Maternal care for other known or suspected poor fetal growth, second trimester, fetus 1: Secondary | ICD-10-CM

## 2024-08-30 DIAGNOSIS — O30042 Twin pregnancy, dichorionic/diamniotic, second trimester: Secondary | ICD-10-CM | POA: Diagnosis not present

## 2024-08-30 DIAGNOSIS — O09292 Supervision of pregnancy with other poor reproductive or obstetric history, second trimester: Secondary | ICD-10-CM

## 2024-08-30 NOTE — Progress Notes (Signed)
 Maternal-Fetal Medicine Consultation  Name: Alyssa [name]  MRN: [id number]  GA: G2P1001 [redacted]w[redacted]d   Dichorionic-diamniotic twin pregnancy with fetal growth restriction in both twins.  Patient does not have hypertension.  On ultrasound performed last week, the estimated fetal weight and the abdominal circumference measurements in twin A where at the 8 percentiles.  Marginal cord insertion was seen.  In twin B, the estimated fetal weight and the abdominal circumference measurements were at the first percentiles.  Twin B has severe fetal growth restriction.  Ultrasound Twin A: Maternal right, lower fetus, breech presentation, anterior placenta.  Normal amniotic fluid.  Umbilical artery Doppler showed normal forward diastolic flow.  Twin B: Upper fetus, transverse lie and head to maternal right, posterior placenta. Normal amniotic fluid.  Umbilical artery Doppler showed normal forward diastolic flow.  Fetal growth restriction in both twins I reassured the patient of normal antenatal testing.  Placental insufficiency is the most common cause of fetal growth restriction.  I discussed the components of antenatal testing including biophysical profile and umbilical artery Doppler studies.    I discussed the importance of weekly antenatal testing.  In general, Twin pregnancy is complicated by fetal growth restriction should be delivered at 77- or 37-weeks' gestation. I added NST to her weekly antenatal testing.   Recommendations -Continue weekly antenatal testing.     Consultation including face-to-face (more than 50%) counseling 20 minutes.

## 2024-09-02 ENCOUNTER — Encounter: Payer: Self-pay | Admitting: Advanced Practice Midwife

## 2024-09-02 ENCOUNTER — Other Ambulatory Visit

## 2024-09-02 ENCOUNTER — Ambulatory Visit (INDEPENDENT_AMBULATORY_CARE_PROVIDER_SITE_OTHER): Admitting: Advanced Practice Midwife

## 2024-09-02 VITALS — BP 107/65 | HR 84 | Wt 189.0 lb

## 2024-09-02 DIAGNOSIS — Z113 Encounter for screening for infections with a predominantly sexual mode of transmission: Secondary | ICD-10-CM

## 2024-09-02 DIAGNOSIS — O0992 Supervision of high risk pregnancy, unspecified, second trimester: Secondary | ICD-10-CM

## 2024-09-02 DIAGNOSIS — O99210 Obesity complicating pregnancy, unspecified trimester: Secondary | ICD-10-CM

## 2024-09-02 DIAGNOSIS — O30042 Twin pregnancy, dichorionic/diamniotic, second trimester: Secondary | ICD-10-CM | POA: Diagnosis not present

## 2024-09-02 DIAGNOSIS — Z3A27 27 weeks gestation of pregnancy: Secondary | ICD-10-CM | POA: Diagnosis not present

## 2024-09-02 DIAGNOSIS — Z130 Encounter for screening for diseases of the blood and blood-forming organs and certain disorders involving the immune mechanism: Secondary | ICD-10-CM

## 2024-09-02 DIAGNOSIS — Z131 Encounter for screening for diabetes mellitus: Secondary | ICD-10-CM

## 2024-09-02 DIAGNOSIS — O365921 Maternal care for other known or suspected poor fetal growth, second trimester, fetus 1: Secondary | ICD-10-CM | POA: Insufficient documentation

## 2024-09-02 NOTE — Progress Notes (Signed)
 Routine Prenatal Care Visit  Subjective  Alyssa [name] is a 28 y.o. G2P1001 at [redacted]w[redacted]d being seen today for ongoing prenatal care.  She is currently monitored for the following issues for this high-risk pregnancy and has Supervision of high-risk pregnancy; H/o pre-eclampsia; Mood disorder; Asthma, mild intermittent; Dichorionic diamniotic twin gestation; History of prior pregnancy with IUGR newborn; Obesity affecting pregnancy, antepartum; IUGR (intrauterine growth restriction) affecting care of mother, second trimester, fetus 2; Labor and delivery, indication for care; Vulvovaginal candidiasis; and IUGR (intrauterine growth restriction) affecting care of mother, second trimester, fetus 1 on their problem list.  ----------------------------------------------------------------------------------- Patient reports she is doing ok. Denies nausea from glucola. No concerns. 28 week labs today. Contractions: Not present. Vag. Bleeding: None.  Movement: Present. Leaking Fluid denies.  ----------------------------------------------------------------------------------- The following portions of the patient's history were reviewed and updated as appropriate: allergies, current medications, past family history, past medical history, past social history, past surgical history and problem list. Problem list updated.  Objective  BP 107/65   Pulse 84   Wt 189 lb (85.7 kg)   LMP 02/23/2024 (Exact Date)   BMI 34.57 kg/m  Pregravid weight 180 lb (81.6 kg) Total Weight Gain 9 lb (4.082 kg) Urinalysis: Urine Protein    Urine Glucose    Fetal Status: Fetal Heart Rate (bpm): 145, 137   Movement: Present      General:  Alert, oriented and cooperative. Patient is in no acute distress.  Skin: Skin is warm and dry. No rash noted.   Cardiovascular: Normal heart rate noted  Respiratory: Normal respiratory effort, no problems with respiration noted  Abdomen: Soft, gravid, appropriate for gestational age.  Pain/Pressure: Absent     Pelvic:  Cervical exam deferred        Extremities: Normal range of motion.  Edema: None  Mental Status: Normal mood and affect. Normal behavior. Normal judgment and thought content.   Assessment   28 y.o. G2P1001 at [redacted]w[redacted]d by  11/29/2024, by Last Menstrual Period presenting for routine prenatal visit  Plan   SECOND Problems (from 04/29/24 to present)     Problem Noted Diagnosed Resolved   Dichorionic diamniotic twin gestation 04/29/2024 by Free, Lauraine PARAS, CNM  No   Supervision of high-risk pregnancy 01/27/2019 by Mauricio Ole SAILOR, RN  No   Overview Addendum 04/27/2019  2:59 PM by Gardner, Tyvona, CMA   Nursing Staff Provider  Office Location  [facility] Dating  First trimester US   Language  English Anatomy US    scheduled for 03/11/19  Flu Vaccine  Declined 01/27/19 Genetic Screen      TDaP vaccine   02/21/19 declined  Hgb A1C or  GTT Early  Third trimester: normal  Rhogam     LAB RESULTS   Feeding Plan  Both Blood Type AB/Positive/-- (03/05 1345)   Contraception  no Antibody Negative (03/05 1345)  Circumcision yes Rubella 14.50 (03/05 1345)  Pediatrician  04/12/19 undecided RPR Non Reactive (03/30 1026)   Support Person fob HBsAg Negative (03/05 1345)   Prenatal Classes  yes HIV Non Reactive (03/30 1026)  BTL Consent  GBS Positive (05/12 1357)(For PCN allergy, check sensitivities)   VBAC Consent  Pap     Hgb Electro  AA    CF neg    SMA neg    Waterbirth  [ ]  Class [ ]  Consent [ ]  CNM visit             Preterm labor symptoms and general obstetric precautions including but not  limited to vaginal bleeding, contractions, leaking of fluid and fetal movement were reviewed in detail with the patient. Please refer to After Visit Summary for other counseling recommendations.   Return in about 2 weeks (around 09/16/2024) for rob.   Slater Rains, CNM [facility] 09/02/2024 10:54 AM

## 2024-09-02 NOTE — Patient Instructions (Signed)
 Fetal Growth Restriction: What to Know  Fetal growth restriction (FGR) is when a baby is not growing normally during pregnancy. A baby with FGR is smaller than they should be and may weigh less than normal at birth. Babies with FGR are at higher risk for early delivery. They may also need more care than usual after birth. What are the causes? Problems with the placenta or umbilical cord. This problem causes the fetus to get less oxygen or nutrition. Not enough food for the mother during pregnancy. Not enough weight gain for the mother during pregnancy. Smoking, drinking alcohol, and taking drugs. Some medicines. Problems that a baby may get before they're born (congenital). Problems that are passed in families (genetic). Being pregnant with twins or more. Infection. What increases the risk? You're older than age 88 or younger than age 77. You have certain medical conditions, such as: High blood pressure. Preeclampsia. Diabetes. Heart or kidney disease. Anemia. You live at a very high altitude. You have a history of FGR, or have a genetic disorder. You have a family history of FGR, or genetic disorders. You had treatments to help you get pregnant. What are the signs or symptoms? FGR does not cause many symptoms. Your health care provider may suspect FGR if your belly is not as big as it should be for the stage of your pregnancy. How is this diagnosed? FGR is diagnosed with physical exams and prenatal exams. Your provider may: Measure your uterus. Measure your baby using ultrasound. Do a test to check the fluid around the baby in the uterus. This will show any infection or problems that can develop before the baby is born. Do tests to check blood flow to the placenta and to the baby. How is this treated? Your provider will treat the cause of FGR. Your pregnancy will be closely watched. If your baby is not getting enough blood because of a problem with the placenta, your baby may  need to be delivered early. Follow these instructions at home: Medicines Take your medicines only as told. This includes vitamins and supplements. Ask your provider before you take or use any medicines, supplements, vitamins, eye drops, and creams. General instructions Eat a healthy diet. Work with your health care provider or a dietitian to make sure that: You are getting enough nutrients. You are gaining enough weight during your pregnancy. Rest as needed. Try to get at least 8 hours of sleep every night. Do not drink alcohol or use drugs. Do not smoke, vape, or use nicotine or tobacco. Keep all follow-up visits. Your provider needs to check your health and your baby's growth. Contact a health care provider if: Your baby is moving less than usual. You have cramps in your belly or have pain in your hips or lower back. You have a sudden, sharp pain in the belly. You have a gush or trickle of fluid from your vagina and you cannot stop it. You have signs of infection, including a fever. You have bleeding from your vagina. You have symptoms of high blood pressure or preeclampsia. These include: A headache that doesn't go away when you take medicine. Sudden or extreme swelling of your face, hands, legs, or feet. Vision problems, such as: You see spots. You have blurry vision. You're sensitive to light. Get help right away if: You faint, have a seizure, or cannot think clearly. You have chest pain or trouble breathing. You have any of these symptoms and you're unable to reach your provider: Symptoms of  infection, including a fever. You have bleeding from your vagina. You have symptoms of high blood pressure or preeclampsia. These symptoms may be an emergency. Call 911 right away. Do not wait to see if the symptoms will go away. Do not drive yourself to the hospital. This information is not intended to replace advice given to you by your health care provider. Make sure you discuss any  questions you have with your health care provider. Document Revised: 04/13/2023 Document Reviewed: 04/13/2023 Elsevier Patient Education  2024 [employer].

## 2024-09-03 LAB — 28 WEEK RH+PANEL
Basophils Absolute: 0.1 x10E3/uL (ref 0.0–0.2)
Basos: 0 %
EOS (ABSOLUTE): 0.2 x10E3/uL (ref 0.0–0.4)
Eos: 2 %
Gestational Diabetes Screen: 112 mg/dL (ref 70–139)
HIV Screen 4th Generation wRfx: NONREACTIVE
Hematocrit: 28.9 % — ABNORMAL LOW (ref 34.0–46.6)
Hemoglobin: 9.5 g/dL — ABNORMAL LOW (ref 11.1–15.9)
Immature Grans (Abs): 0.2 x10E3/uL — ABNORMAL HIGH (ref 0.0–0.1)
Immature Granulocytes: 1 %
Lymphocytes Absolute: 2.7 x10E3/uL (ref 0.7–3.1)
Lymphs: 19 %
MCH: 29.1 pg (ref 26.6–33.0)
MCHC: 32.9 g/dL (ref 31.5–35.7)
MCV: 89 fL (ref 79–97)
Monocytes Absolute: 1 x10E3/uL — ABNORMAL HIGH (ref 0.1–0.9)
Monocytes: 7 %
Neutrophils Absolute: 9.8 x10E3/uL — ABNORMAL HIGH (ref 1.4–7.0)
Neutrophils: 71 %
Platelets: 240 x10E3/uL (ref 150–450)
RBC: 3.26 x10E6/uL — ABNORMAL LOW (ref 3.77–5.28)
RDW: 11.6 % — ABNORMAL LOW (ref 11.7–15.4)
RPR Ser Ql: NONREACTIVE
WBC: 13.8 x10E3/uL — ABNORMAL HIGH (ref 3.4–10.8)

## 2024-09-03 LAB — IRON AND TIBC
Iron Saturation: 4 % — CL (ref 15–55)
Iron: 17 ug/dL — ABNORMAL LOW (ref 27–159)
Total Iron Binding Capacity: 420 ug/dL (ref 250–450)
UIBC: 403 ug/dL (ref 131–425)

## 2024-09-03 LAB — VITAMIN B12: Vitamin B-12: 508 pg/mL (ref 232–1245)

## 2024-09-03 LAB — FERRITIN: Ferritin: 9 ng/mL — ABNORMAL LOW (ref 15–150)

## 2024-09-03 LAB — FOLATE: Folate: 4.5 ng/mL (ref >3.0)

## 2024-09-04 ENCOUNTER — Other Ambulatory Visit: Payer: Self-pay | Admitting: Obstetrics

## 2024-09-04 ENCOUNTER — Encounter: Payer: Self-pay | Admitting: Obstetrics

## 2024-09-04 ENCOUNTER — Ambulatory Visit: Payer: Self-pay | Admitting: Obstetrics

## 2024-09-04 DIAGNOSIS — O99013 Anemia complicating pregnancy, third trimester: Secondary | ICD-10-CM | POA: Insufficient documentation

## 2024-09-06 ENCOUNTER — Other Ambulatory Visit: Payer: Self-pay | Admitting: Obstetrics

## 2024-09-06 MED ORDER — ACCRUFER 30 MG PO CAPS: 1.0000 | ORAL_CAPSULE | Freq: Two times a day (BID) | ORAL | 6 refills | Status: AC

## 2024-09-09 ENCOUNTER — Ambulatory Visit: Attending: Obstetrics and Gynecology

## 2024-09-09 ENCOUNTER — Other Ambulatory Visit: Payer: Self-pay | Admitting: Maternal & Fetal Medicine

## 2024-09-09 ENCOUNTER — Other Ambulatory Visit: Payer: Self-pay | Admitting: *Deleted

## 2024-09-09 ENCOUNTER — Ambulatory Visit (HOSPITAL_BASED_OUTPATIENT_CLINIC_OR_DEPARTMENT_OTHER): Admitting: Obstetrics

## 2024-09-09 ENCOUNTER — Ambulatory Visit: Admitting: Obstetrics

## 2024-09-09 VITALS — BP 137/82 | HR 126

## 2024-09-09 DIAGNOSIS — O365932 Maternal care for other known or suspected poor fetal growth, third trimester, fetus 2: Secondary | ICD-10-CM

## 2024-09-09 DIAGNOSIS — O30043 Twin pregnancy, dichorionic/diamniotic, third trimester: Secondary | ICD-10-CM

## 2024-09-09 DIAGNOSIS — O0993 Supervision of high risk pregnancy, unspecified, third trimester: Secondary | ICD-10-CM | POA: Diagnosis present

## 2024-09-09 DIAGNOSIS — O365931 Maternal care for other known or suspected poor fetal growth, third trimester, fetus 1: Secondary | ICD-10-CM

## 2024-09-09 DIAGNOSIS — E669 Obesity, unspecified: Secondary | ICD-10-CM

## 2024-09-09 DIAGNOSIS — O365922 Maternal care for other known or suspected poor fetal growth, second trimester, fetus 2: Secondary | ICD-10-CM | POA: Insufficient documentation

## 2024-09-09 DIAGNOSIS — O99213 Obesity complicating pregnancy, third trimester: Secondary | ICD-10-CM | POA: Diagnosis not present

## 2024-09-09 DIAGNOSIS — Z3A28 28 weeks gestation of pregnancy: Secondary | ICD-10-CM | POA: Insufficient documentation

## 2024-09-09 DIAGNOSIS — O99513 Diseases of the respiratory system complicating pregnancy, third trimester: Secondary | ICD-10-CM

## 2024-09-09 DIAGNOSIS — J45909 Unspecified asthma, uncomplicated: Secondary | ICD-10-CM

## 2024-09-09 NOTE — Procedures (Signed)
 Joda Hardrick-Chambers 17-Jan-1996 [redacted]w[redacted]d  Fetus A Non-Stress Test Interpretation for 09/09/24  Indication: didi twins, IUGR B  Fetal Heart Rate A Mode: External Variability: Moderate Accelerations: 10 x 10 Decelerations: None Multiple birth?: Yes  Uterine Activity Mode: Toco Contraction Frequency (min): none Resting Tone Palpated: Relaxed  Interpretation (Fetal Testing) Nonstress Test Interpretation: Reactive Comments: Tracing reviewed by DR Ileana Musty Hardrick-Chambers 1995-12-19 [redacted]w[redacted]d   Fetus B Non-Stress Test Interpretation for 09/09/24  Indication: didi twins, iugr B  Fetal Heart Rate Fetus B Mode: External Baseline Rate (B): 145 BPM Accelerations: 10 x 10 Decelerations: None  Uterine Activity Mode: Toco Contraction Frequency (min): none Resting Tone Palpated: Relaxed  Interpretation (Baby B - Fetal Testing) Nonstress Test Interpretation (Baby B): Reactive

## 2024-09-09 NOTE — Procedures (Signed)
 No note

## 2024-09-09 NOTE — Progress Notes (Signed)
 MFM Consult Note  Alyssa [name] is currently at 28 weeks and 3 days.  She has been followed due to a dichorionic, diamniotic twin gestation with IUGR of both fetuses.    She denies any problems since her last exam and reports feeling fetal movements of both fetuses throughout the day.    Twin A:  Normal amniotic fluid.  Doppler studies of the umbilical arteries showed continued normal forward flow.  There were no signs of absent or reversed end-diastolic flow noted.    Twin B: Normal amniotic fluid.  Doppler studies of the umbilical arteries showed continued normal forward flow.  There were no signs of absent or reversed end-diastolic flow noted.    She had a reactive NST for both fetuses today.  Due to IUGR, we will continue to follow her with weekly fetal testing and umbilical artery Doppler studies.    The patient understands that should IUGR continue to be noted in a dichorionic twin gestation, delivery will be recommended at around 36 weeks.  She will return in 1 week for another growth scan, NST, and umbilical artery Doppler study.  The patient stated that all of her questions were answered today.  A total of 20 minutes was spent counseling and coordinating the care for this patient.  Greater than 50% of the time was spent in direct face-to-face contact.

## 2024-09-14 ENCOUNTER — Other Ambulatory Visit

## 2024-09-14 ENCOUNTER — Ambulatory Visit (HOSPITAL_BASED_OUTPATIENT_CLINIC_OR_DEPARTMENT_OTHER)

## 2024-09-14 ENCOUNTER — Observation Stay
Admission: RE | Admit: 2024-09-14 | Discharge: 2024-09-14 | Disposition: A | Discharge status: Home / Self Care | Source: Ambulatory Visit | Attending: Certified Nurse Midwife | Admitting: Certified Nurse Midwife

## 2024-09-14 ENCOUNTER — Ambulatory Visit

## 2024-09-14 ENCOUNTER — Other Ambulatory Visit: Payer: Self-pay | Admitting: Maternal & Fetal Medicine

## 2024-09-14 VITALS — BP 126/83 | HR 101 | Temp 98.3°F

## 2024-09-14 DIAGNOSIS — O99213 Obesity complicating pregnancy, third trimester: Secondary | ICD-10-CM | POA: Insufficient documentation

## 2024-09-14 DIAGNOSIS — O365922 Maternal care for other known or suspected poor fetal growth, second trimester, fetus 2: Secondary | ICD-10-CM

## 2024-09-14 DIAGNOSIS — O09293 Supervision of pregnancy with other poor reproductive or obstetric history, third trimester: Secondary | ICD-10-CM | POA: Insufficient documentation

## 2024-09-14 DIAGNOSIS — J45909 Unspecified asthma, uncomplicated: Secondary | ICD-10-CM | POA: Insufficient documentation

## 2024-09-14 DIAGNOSIS — O365932 Maternal care for other known or suspected poor fetal growth, third trimester, fetus 2: Secondary | ICD-10-CM | POA: Diagnosis not present

## 2024-09-14 DIAGNOSIS — Z3A29 29 weeks gestation of pregnancy: Secondary | ICD-10-CM | POA: Diagnosis not present

## 2024-09-14 DIAGNOSIS — O365931 Maternal care for other known or suspected poor fetal growth, third trimester, fetus 1: Secondary | ICD-10-CM | POA: Insufficient documentation

## 2024-09-14 DIAGNOSIS — O368330 Maternal care for abnormalities of the fetal heart rate or rhythm, third trimester, not applicable or unspecified: Secondary | ICD-10-CM | POA: Diagnosis present

## 2024-09-14 DIAGNOSIS — O0993 Supervision of high risk pregnancy, unspecified, third trimester: Secondary | ICD-10-CM

## 2024-09-14 DIAGNOSIS — O99513 Diseases of the respiratory system complicating pregnancy, third trimester: Secondary | ICD-10-CM | POA: Insufficient documentation

## 2024-09-14 DIAGNOSIS — O30043 Twin pregnancy, dichorionic/diamniotic, third trimester: Secondary | ICD-10-CM | POA: Insufficient documentation

## 2024-09-14 DIAGNOSIS — E669 Obesity, unspecified: Secondary | ICD-10-CM | POA: Diagnosis not present

## 2024-09-14 NOTE — Progress Notes (Signed)
 EFM tracing reviewed by provider. Orders for discharge received.

## 2024-09-14 NOTE — OB Triage Note (Signed)
 LABOR & DELIVERY OB TRIAGE NOTE  SUBJECTIVE  HPI Alyssa [name] is a 28 y.o. G2P1001 at [redacted]w[redacted]d who presents to Labor & Delivery for routine NST for twin pregnancy from perinatal clinic  OB History     Gravida  2   Para  1   Term  1   Preterm  0   AB  0   Living  1      SAB  0   IAB  0   Ectopic  0   Multiple  0   Live Births  1          OBJECTIVE  BP 99/64   Pulse 88   Temp 98.2 F (36.8 C) (Oral)   Resp 20   LMP 02/23/2024 (Exact Date)     NST A I reviewed the NST and it was reactive.  Baseline:135 Variability: moderate Accelerations: 15 x 15 Decelerations:none Toco: none  NST B Baseline:150 Variability: moderate Accelerations:15 x 15 Decelerations:none    ASSESSMENT Impression  1) Pregnancy at G2P1001, [redacted]w[redacted]d, Estimated Date of Delivery: 11/29/24 2) Reassuring maternal/fetal status  PLAN 1) follow up with perinatal clinic as scheduled  Damien PARSLEY, CNM  09/14/24  4:03 PM

## 2024-09-14 NOTE — OB Triage Note (Signed)
 Patient sent over from perinatal clinic for NST.

## 2024-09-16 ENCOUNTER — Observation Stay
Admission: EM | Admit: 2024-09-16 | Discharge: 2024-09-16 | Disposition: A | Discharge status: Home / Self Care | Source: Ambulatory Visit | Attending: Obstetrics | Admitting: Obstetrics

## 2024-09-16 ENCOUNTER — Ambulatory Visit (INDEPENDENT_AMBULATORY_CARE_PROVIDER_SITE_OTHER): Admitting: Licensed Practical Nurse

## 2024-09-16 ENCOUNTER — Encounter: Payer: Self-pay | Admitting: Obstetrics and Gynecology

## 2024-09-16 VITALS — BP 102/67 | HR 91 | Wt 189.0 lb

## 2024-09-16 DIAGNOSIS — O368330 Maternal care for abnormalities of the fetal heart rate or rhythm, third trimester, not applicable or unspecified: Principal | ICD-10-CM | POA: Insufficient documentation

## 2024-09-16 DIAGNOSIS — Z3A29 29 weeks gestation of pregnancy: Secondary | ICD-10-CM | POA: Insufficient documentation

## 2024-09-16 DIAGNOSIS — O99013 Anemia complicating pregnancy, third trimester: Secondary | ICD-10-CM

## 2024-09-16 DIAGNOSIS — O30043 Twin pregnancy, dichorionic/diamniotic, third trimester: Secondary | ICD-10-CM | POA: Diagnosis not present

## 2024-09-16 DIAGNOSIS — O0993 Supervision of high risk pregnancy, unspecified, third trimester: Secondary | ICD-10-CM | POA: Diagnosis not present

## 2024-09-16 NOTE — Progress Notes (Signed)
 Discharge instructions provided to patient. Patient verbalized understanding. Pt educated on signs and symptoms of labor, vaginal bleeding, LOF, and fetal movement. Red flag signs reviewed by RN. Patient instructed to return next Friday 09/23/2024 at 0930 for scheduled weekly NST. Patient discharged home in stable condition.

## 2024-09-16 NOTE — Assessment & Plan Note (Signed)
-  Saw MFM 10/22:  Positive interval growth with measurements consistent with  dates in twin A but persistent severe FGR in twin B.  Good fetal movement and amniotic fluid volume noted  The UAD were normal in twin A but elevated in twin B without  evidence of AEDF or REDF.  The twin discordance is 24% today. -Per Dr Kizzie Can you all do NST on Thursday or Friday and we do NST/UAD/AFV on Monday/Tuesday  -Pt headed to [facility] for NST

## 2024-09-16 NOTE — OB Triage Note (Signed)
 Patient sent over from office for NST.

## 2024-09-16 NOTE — Progress Notes (Signed)
    Return Prenatal Note   Subjective   28 y.o. G2P1001 at [redacted]w[redacted]d presents for this follow-up prenatal visit.  Patient  Patient reports:Doing ok, is very tired. Has been reading about birth, strongly desires vaginal birth, asked about vaginal breech birth-discussed vaginal breech in the setting of twins depends on various factors.  -Pt asked about pain management for c-section, would like to avoid epidural/spinal out of concern of something going into her spine discussed spinal/epidural is typically used for for a c-section. This is the best method for pain management, general anesthesia is used only when necessary.   Movement: Present Contractions: Irritability  Objective   Flow sheet Vitals: Pulse Rate: 91 BP: 102/67 Fundal Height: 34 cm Fetal Heart Rate (bpm): 138, 141 Total weight gain: 9 lb (4.082 kg)  General Appearance  No acute distress, well appearing, and well nourished Pulmonary   Normal work of breathing Neurologic   Alert and oriented to person, place, and time Psychiatric   Mood and affect within normal limits   Assessment/Plan   Plan  28 y.o. G2P1001 at [redacted]w[redacted]d presents for follow-up OB visit. Reviewed prenatal record including previous visit note.  Anemia affecting pregnancy in third trimester -has not yet picked up Iron, encouraged to do so   Dichorionic diamniotic twin gestation -Saw MFM 10/22:  Positive interval growth with measurements consistent with  dates in twin A but persistent severe FGR in twin B.  Good fetal movement and amniotic fluid volume noted  The UAD were normal in twin A but elevated in twin B without  evidence of AEDF or REDF.  The twin discordance is 24% today. -Per Dr Kizzie Can you all do NST on Thursday or Friday and we do NST/UAD/AFV on Monday/Tuesday  -Pt headed to [facility] for NST   Supervision of high-risk pregnancy -TWG 9lbs, appetite has improved -Anesthesia referral placed to discussed concerns with Epidural/Spinal   -Breastfed for about 1 year, had a good experience, would like to breastfeed again -warning signs reviewed      Orders Placed This Encounter  Procedures   Ambulatory referral to Anesthesiology    Referral Priority:   Routine    Referral Type:   Surgical    Referral Reason:   Specialty Services Required    Requested Specialty:   Anesthesiology    Number of Visits Requested:   1   No follow-ups on file.   Future Appointments  Date Time Provider Department Center  09/28/2024  3:00 PM [facility] NST [facility] None  09/28/2024  4:00 PM [facility]  09/29/2024 10:35 AM Charma Domino, CNM [facility] None  10/05/2024  3:00 PM [facility] NST [facility] None  10/05/2024  4:00 PM [facility]    For next visit:  NST in 1 week, ROB/NST 2 weeks      Burgundy Matuszak M Cyndia Degraff, CNM  10/24/251:07 PM

## 2024-09-16 NOTE — OB Triage Note (Signed)
 LABOR & DELIVERY OB TRIAGE NOTE  SUBJECTIVE  HPI Alyssa [name] is a 28 y.o. G2P1001 at [redacted]w[redacted]d who presents to Labor & Delivery for an NST for di-di twins. She has no other concerns today.  OB History     Gravida  2   Para  1   Term  1   Preterm  0   AB  0   Living  1      SAB  0   IAB  0   Ectopic  0   Multiple  0   Live Births  1           Scheduled Meds: Continuous Infusions: PRN Meds:.  OBJECTIVE  BP 101/66 (BP Location: Left Arm)   Pulse 97   Temp 98.3 F (36.8 C) (Oral)   Resp 16   LMP 02/23/2024 (Exact Date)    NST I reviewed the NST and it was reactive.  Fetus A Baseline:145 Variability: moderate Accelerations: present Decelerations:none Category 1  Fetus B Baseline: 140 Variability: moderate Accelerations: present Decelerations:none Category 1   Toco: quiet  ASSESSMENT Impression  1) Pregnancy at G2P1001, [redacted]w[redacted]d, Estimated Date of Delivery: 11/29/24 2) Reassuring maternal/fetal status. RNST.  PLAN 1) Discharge home with standard labor/return precautions 2) Keep scheduled ROB appt 3) NST scheduled for 09/23/24 @ 0930  Eleanor Canny, CNM 09/16/24  12:45 PM

## 2024-09-16 NOTE — Assessment & Plan Note (Signed)
-  TWG 9lbs, appetite has improved -Anesthesia referral placed to discussed concerns with Epidural/Spinal  -Breastfed for about 1 year, had a good experience, would like to breastfeed again -warning signs reviewed

## 2024-09-16 NOTE — Assessment & Plan Note (Signed)
-  has not yet picked up Iron, encouraged to do so

## 2024-09-20 ENCOUNTER — Encounter
Admission: RE | Admit: 2024-09-20 | Discharge: 2024-09-20 | Disposition: A | Discharge status: Home / Self Care | Source: Ambulatory Visit | Attending: Anesthesiology | Admitting: Anesthesiology

## 2024-09-20 NOTE — Progress Notes (Signed)
 Pt had general, nonspecific concerns about epidural/spinal anesthesia. The methods and reasons for neuraxial anesthesia and general anesthesia for child birth were discussed. The risks were discussed to patient's satisfaction. I encouraged her to bring any further questions to us .

## 2024-09-28 ENCOUNTER — Ambulatory Visit (HOSPITAL_BASED_OUTPATIENT_CLINIC_OR_DEPARTMENT_OTHER)

## 2024-09-28 ENCOUNTER — Ambulatory Visit: Attending: Maternal & Fetal Medicine

## 2024-09-28 DIAGNOSIS — O365921 Maternal care for other known or suspected poor fetal growth, second trimester, fetus 1: Secondary | ICD-10-CM

## 2024-09-28 DIAGNOSIS — O30043 Twin pregnancy, dichorionic/diamniotic, third trimester: Secondary | ICD-10-CM | POA: Insufficient documentation

## 2024-09-28 DIAGNOSIS — O365931 Maternal care for other known or suspected poor fetal growth, third trimester, fetus 1: Secondary | ICD-10-CM

## 2024-09-28 DIAGNOSIS — O365932 Maternal care for other known or suspected poor fetal growth, third trimester, fetus 2: Secondary | ICD-10-CM | POA: Insufficient documentation

## 2024-09-28 DIAGNOSIS — O365922 Maternal care for other known or suspected poor fetal growth, second trimester, fetus 2: Secondary | ICD-10-CM | POA: Diagnosis present

## 2024-09-28 DIAGNOSIS — Z3A31 31 weeks gestation of pregnancy: Secondary | ICD-10-CM | POA: Diagnosis not present

## 2024-09-28 DIAGNOSIS — O99213 Obesity complicating pregnancy, third trimester: Secondary | ICD-10-CM | POA: Diagnosis not present

## 2024-09-28 DIAGNOSIS — J45909 Unspecified asthma, uncomplicated: Secondary | ICD-10-CM | POA: Insufficient documentation

## 2024-09-28 DIAGNOSIS — O99513 Diseases of the respiratory system complicating pregnancy, third trimester: Secondary | ICD-10-CM | POA: Insufficient documentation

## 2024-09-28 DIAGNOSIS — O09293 Supervision of pregnancy with other poor reproductive or obstetric history, third trimester: Secondary | ICD-10-CM | POA: Insufficient documentation

## 2024-09-28 DIAGNOSIS — O365930 Maternal care for other known or suspected poor fetal growth, third trimester, not applicable or unspecified: Secondary | ICD-10-CM | POA: Diagnosis not present

## 2024-09-28 DIAGNOSIS — O0993 Supervision of high risk pregnancy, unspecified, third trimester: Secondary | ICD-10-CM

## 2024-09-28 DIAGNOSIS — E669 Obesity, unspecified: Secondary | ICD-10-CM

## 2024-09-28 NOTE — Procedures (Signed)
 Alyssa [name] 1996-08-01 [redacted]w[redacted]d  Fetus A Non-Stress Test Interpretation for 09/28/24  Indication: IUGR and Di/di twin pregnancy  Fetal Heart Rate A Mode: External Baseline Rate (A): 135 bpm Variability: Moderate Accelerations: 15 x 15 Decelerations: None Multiple birth?: Yes  Uterine Activity Mode: Palpation Contraction Frequency (min): none noted Resting Tone Palpated: Relaxed  Interpretation (Fetal Testing) Nonstress Test Interpretation: Reactive Comments: Reviewed with Dr. Kizzie [name] 1996/04/02 [redacted]w[redacted]d   Fetus B Non-Stress Test Interpretation for 09/28/24  Indication: IUGR and Di/di twin pregnancy  Fetal Heart Rate Fetus B Mode: External Baseline Rate (B): 140 BPM Variability: Moderate Accelerations: 10 x 10 Decelerations: None  Uterine Activity Mode: Palpation Contraction Frequency (min): none noted Resting Tone Palpated: Relaxed  Interpretation (Baby B - Fetal Testing) Nonstress Test Interpretation (Baby B): Reactive Comments (Baby B): Reviewed with Dr. Kizzie

## 2024-09-29 ENCOUNTER — Ambulatory Visit (INDEPENDENT_AMBULATORY_CARE_PROVIDER_SITE_OTHER): Admitting: Registered Nurse

## 2024-09-29 VITALS — BP 111/61 | HR 84 | Wt 193.0 lb

## 2024-09-29 DIAGNOSIS — Z3A31 31 weeks gestation of pregnancy: Secondary | ICD-10-CM | POA: Insufficient documentation

## 2024-09-29 DIAGNOSIS — O0993 Supervision of high risk pregnancy, unspecified, third trimester: Secondary | ICD-10-CM | POA: Diagnosis not present

## 2024-09-29 DIAGNOSIS — O99013 Anemia complicating pregnancy, third trimester: Secondary | ICD-10-CM | POA: Diagnosis not present

## 2024-09-29 DIAGNOSIS — O30043 Twin pregnancy, dichorionic/diamniotic, third trimester: Secondary | ICD-10-CM

## 2024-09-29 NOTE — Assessment & Plan Note (Signed)
 Last Hgb was 9.5 on 10/10. Was prescribed accrufer at that time. Has not filled rx. Doesn't like taking pills or medicines.   Stressed importance of correcting anemia in pregnancy, especially in a twin pregnancy. Reviewed with her that the delivery of multiples (regardless of route) increases her risk of PPH, and that we want to ensure that she has the safest birth and postpartum period possible.  Reviewed with her that there is a delay in improvement in anemia after we start iron supplementation and that improvement in labs can take 4 weeks or more. So timely initiation of treatment, and consistency are key. We discussed sources of iron rich foods and using cast iron skillet with cooking but that these sources of iron are not sufficient to CORRECT anemia in her case. She declines referral for IV iron.

## 2024-09-29 NOTE — Assessment & Plan Note (Addendum)
 Saw MFM yesterday and had normal fluid, AFI, and dopplers for BOTH fetuses (previously elevated dopplers). Scheduled w/ MFM on 11/12 for AFI, NST and dopplers. Last growth scan was 10/22. Likely next growth will be on 11/12 vs 11/19. Plan is 2x/ wk ANT for now. Has been having to go to labor and delivery for NSTs since we can do them here. She is frustrated by having to go to three different appointments each week.  I discussed with our ultrasonographer, and she is able to do a weekly twin BPP here. We will try to schedule those for Mondays or Fridays, and the patient will see MFM on Wednesdays for her testing there.  Alyssa [name] is happy with this plan.

## 2024-09-29 NOTE — Progress Notes (Signed)
 Return Prenatal Note   Subjective   28 y.o. G2P1001 at [redacted]w[redacted]d presents for this follow-up prenatal visit. Saw MFM yesterday for umbilical dopplers, AFI. Next visit is scheduled with them for 11/12. She has not picked up her oral iron. Does not want to get IV iron. Feeling frustrated with seeing MFM weekly, us  weekly, then having to go to labor and delivery for NSTs.  Patient has some hip pain. Patient reports: Movement: Present Contractions: Not present  Objective   Flow sheet Vitals: Pulse Rate: 84 BP: 111/61 Fetal Heart Rate (bpm): 140/130 Total weight gain: 13 lb (5.897 kg)  General Appearance  No acute distress, well appearing, and well nourished Pulmonary   Normal work of breathing Neurologic   Alert and oriented to person, place, and time Psychiatric   Mood and affect within normal limits   Assessment/Plan   Plan  28 y.o. G2P1001 at [redacted]w[redacted]d presents for follow-up OB visit. Reviewed prenatal record including previous visit note.  Dichorionic diamniotic twin gestation Saw MFM yesterday and had normal fluid, AFI, and dopplers for BOTH fetuses (previously elevated dopplers). Scheduled w/ MFM on 11/12 for AFI, NST and dopplers. Last growth scan was 10/22. Likely next growth will be on 11/12 vs 11/19. Plan is 2x/ wk ANT for now. Has been having to go to labor and delivery for NSTs since we can do them here. She is frustrated by having to go to three different appointments each week.  I discussed with our ultrasonographer, and she is able to do a weekly twin BPP here. We will try to schedule those for Mondays or Fridays, and the patient will see MFM on Wednesdays for her testing there.  Alyssa [name] is happy with this plan.   Anemia affecting pregnancy in third trimester Last Hgb was 9.5 on 10/10. Was prescribed accrufer at that time. Has not filled rx. Doesn't like taking pills or medicines.   Stressed importance of correcting anemia in pregnancy, especially in a twin pregnancy.  Reviewed with her that the delivery of multiples (regardless of route) increases her risk of PPH, and that we want to ensure that she has the safest birth and postpartum period possible.  Reviewed with her that there is a delay in improvement in anemia after we start iron supplementation and that improvement in labs can take 4 weeks or more. So timely initiation of treatment, and consistency are key. We discussed sources of iron rich foods and using cast iron skillet with cooking but that these sources of iron are not sufficient to CORRECT anemia in her case. She declines referral for IV iron.  Will need upcoming visit with Dr Starla or Leigh to discuss mode of delivery.  Orders Placed This Encounter  Procedures   US  FETAL BPP WO NON STRESS    Standing Status:   Future    Expected Date:   10/03/2024    Expiration Date:   09/29/2025    Scheduling Instructions:     TWINS!      Will need an hour of scan time blocked off.     Will need to be done WEEKLY.    Reason for exam::   Di/ di twin pregnancy    Preferred imaging location?:   Internal   No follow-ups on file.   Future Appointments  Date Time Provider Department Center  10/05/2024  3:00 PM [facility] NST [facility] None  10/05/2024  4:00 PM [facility]  10/07/2024 10:15 AM [facility] US  1 [facility] None  For next visit:  Weekly ROB     Lauraine Lakes, CNM  09/29/24 12:22 PM

## 2024-10-05 ENCOUNTER — Observation Stay
Admission: RE | Admit: 2024-10-05 | Discharge: 2024-10-05 | Disposition: A | Discharge status: Home / Self Care | Source: Ambulatory Visit | Attending: Obstetrics | Admitting: Obstetrics

## 2024-10-05 ENCOUNTER — Other Ambulatory Visit: Payer: Self-pay | Admitting: Obstetrics

## 2024-10-05 ENCOUNTER — Ambulatory Visit

## 2024-10-05 ENCOUNTER — Encounter: Payer: Self-pay | Admitting: Obstetrics and Gynecology

## 2024-10-05 ENCOUNTER — Other Ambulatory Visit: Payer: Self-pay

## 2024-10-05 ENCOUNTER — Ambulatory Visit (HOSPITAL_BASED_OUTPATIENT_CLINIC_OR_DEPARTMENT_OTHER)

## 2024-10-05 VITALS — BP 118/70 | HR 84

## 2024-10-05 DIAGNOSIS — O30043 Twin pregnancy, dichorionic/diamniotic, third trimester: Secondary | ICD-10-CM

## 2024-10-05 DIAGNOSIS — O365930 Maternal care for other known or suspected poor fetal growth, third trimester, not applicable or unspecified: Secondary | ICD-10-CM | POA: Diagnosis not present

## 2024-10-05 DIAGNOSIS — O365922 Maternal care for other known or suspected poor fetal growth, second trimester, fetus 2: Secondary | ICD-10-CM

## 2024-10-05 DIAGNOSIS — J45909 Unspecified asthma, uncomplicated: Secondary | ICD-10-CM | POA: Insufficient documentation

## 2024-10-05 DIAGNOSIS — Z3A32 32 weeks gestation of pregnancy: Secondary | ICD-10-CM | POA: Diagnosis not present

## 2024-10-05 DIAGNOSIS — O99513 Diseases of the respiratory system complicating pregnancy, third trimester: Secondary | ICD-10-CM | POA: Insufficient documentation

## 2024-10-05 DIAGNOSIS — O98513 Other viral diseases complicating pregnancy, third trimester: Secondary | ICD-10-CM

## 2024-10-05 DIAGNOSIS — O368330 Maternal care for abnormalities of the fetal heart rate or rhythm, third trimester, not applicable or unspecified: Secondary | ICD-10-CM | POA: Diagnosis not present

## 2024-10-05 DIAGNOSIS — O365932 Maternal care for other known or suspected poor fetal growth, third trimester, fetus 2: Secondary | ICD-10-CM | POA: Insufficient documentation

## 2024-10-05 DIAGNOSIS — O09293 Supervision of pregnancy with other poor reproductive or obstetric history, third trimester: Secondary | ICD-10-CM | POA: Insufficient documentation

## 2024-10-05 DIAGNOSIS — Z362 Encounter for other antenatal screening follow-up: Secondary | ICD-10-CM | POA: Insufficient documentation

## 2024-10-05 DIAGNOSIS — E669 Obesity, unspecified: Secondary | ICD-10-CM | POA: Diagnosis not present

## 2024-10-05 DIAGNOSIS — O365921 Maternal care for other known or suspected poor fetal growth, second trimester, fetus 1: Secondary | ICD-10-CM

## 2024-10-05 DIAGNOSIS — O99213 Obesity complicating pregnancy, third trimester: Secondary | ICD-10-CM | POA: Insufficient documentation

## 2024-10-05 DIAGNOSIS — O0993 Supervision of high risk pregnancy, unspecified, third trimester: Principal | ICD-10-CM

## 2024-10-05 LAB — WET PREP, GENITAL
Clue Cells Wet Prep HPF POC: NONE SEEN
Sperm: NONE SEEN
Trich, Wet Prep: NONE SEEN
WBC, Wet Prep HPF POC: <10 (ref <10)
Yeast Wet Prep HPF POC: NONE SEEN

## 2024-10-05 LAB — FETAL FIBRONECTIN: Fetal Fibronectin: NEGATIVE

## 2024-10-05 MED ORDER — NIFEDIPINE 10 MG PO CAPS
20.0000 mg | ORAL_CAPSULE | Freq: Once | ORAL | Status: AC
  Administered 2024-10-05: 20 mg via ORAL
  Filled 2024-10-05: qty 2

## 2024-10-05 MED ORDER — ACETAMINOPHEN 325 MG PO TABS: 650.0000 mg | ORAL_TABLET | ORAL | Status: DC | PRN

## 2024-10-05 MED ORDER — LACTATED RINGERS IV BOLUS
1000.0000 mL | Freq: Once | INTRAVENOUS | Status: AC
  Administered 2024-10-05: 1000 mL via INTRAVENOUS

## 2024-10-05 MED ORDER — ONDANSETRON HCL 4 MG/2ML IJ SOLN: 4.0000 mg | Freq: Four times a day (QID) | INTRAMUSCULAR | Status: DC | PRN

## 2024-10-05 MED ORDER — LACTATED RINGERS IV SOLN: INTRAVENOUS | Status: DC

## 2024-10-05 MED ORDER — SOD CITRATE-CITRIC ACID 500-334 MG/5ML PO SOLN: 30.0000 mL | ORAL | Status: DC | PRN

## 2024-10-05 NOTE — Progress Notes (Signed)
 [facility] OB Attending Note Late afternoon, the patient sent to [facility] triage from MFM ultrasound due to 6/10 (-2 for no tone and no breathing) for baby B, but neither MFM who sent her or [facility] CNM who accepted the patient did not know that there is no BPP capabilities for twins at [facility] off hours. When CNM called to MAU about having patient come by private vehicle for BPP, [facility] attending stated that their NICU and antepartum service is full.   EFM appears to show both babies to be reactive. Patient also initially having q36m contractions on the monitor (not feeling each one) and she was given IVF and a dose of procardia. Her FFN was negative and patient didn't tolerate exam and cervix very posterior but she didn't feel to be dilated, per CNM. UCs minimal on monitor after this and patient feeling better.   [facility] CNM discussed with patient for option to stay at [facility] overnight on [facility] and repeat her BPP in the morning here or to go to [facility] for a BPP tonight. If BPP improved, then patient okay for discharge to home. If not, then patient would need to be watched overnight and repeat her ultrasound in morning, which would involve this being done [facility], meaning patient would have to come back to [facility]. Per [facility] CNM, patient elects to go to [facility] for BPP tonight. I spoke to [facility] OB Attending.  Bebe Izell Raddle MD Attending Center for [employer] (Faculty Practice) 10/05/2024 Time: 2200

## 2024-10-05 NOTE — Progress Notes (Addendum)
   10/05/24 1930  Fetal Heart Rate A  Mode External  Baseline Rate (A) 140 bpm  Variability 6-25 BPM  Fetal Heart Rate Fetus B  Mode External  Baseline Rate (B) 145 BPM  Variability 6-25 BPM  Accelerations 15 x 15  Decelerations None   Unable to get continuous tracing on the babies from 1930-2030, due to active fetal movement and maternal procedures. CNM M. Swanson at bedside and aware of broken tracing.

## 2024-10-05 NOTE — Procedures (Signed)
 Simya Hardrick-Chambers Apr 02, 1996 [redacted]w[redacted]d  Fetus A Non-Stress Test Interpretation for 10/05/24  Indication: IUGR and twin pregnancy  Fetal Heart Rate A Mode: External Baseline Rate (A): 145 bpm Variability: Moderate Accelerations: 15 x 15 Decelerations: None Multiple birth?: Yes  Uterine Activity Mode: Palpation Contraction Frequency (min): none noted Resting Tone Palpated: Relaxed  Interpretation (Fetal Testing) Nonstress Test Interpretation: Reactive Comments: Reviewed with Dr. Kizzie Musty Hardrick-Chambers 1996-01-24 [redacted]w[redacted]d   Fetus B Non-Stress Test Interpretation for 10/05/24  Indication: IUGR and twin pregnancy  Fetal Heart Rate Fetus B Mode: External Baseline Rate (B): 150 BPM Variability: Moderate Accelerations: 15 x 15 Decelerations: None  Uterine Activity Mode: Palpation Contraction Frequency (min): none noted Resting Tone Palpated: Relaxed  Interpretation (Baby B - Fetal Testing) Nonstress Test Interpretation (Baby B): Reactive Comments (Baby B): Reviewed with Dr. Kizzie

## 2024-10-05 NOTE — OB Triage Provider Note (Signed)
 LABOR & DELIVERY OB TRIAGE NOTE  SUBJECTIVE  HPI Savhanna Hardrick-Chambers is a 28 y.o. G2P1001 at [redacted]w[redacted]d with di-di twins who presents to Labor & Delivery from MFM. Her pregnancy is complicated by FGR in twin B. Her BPP today for twin A was 8/10 and for twin B it was 6/10. Dr. Kizzie recommended that she be sent to L&D for extended monitoring and a repeat BPP in 4-6 hours. While in triage, she reported having some cramping and contractions. Ashli was feeling some of the contractions strongly but not all of them; some she reported she did not feel at all or felt like mild cramping.   I consulted with Dr. Izell, who recommended that Avianna go to the MAU for her BPP once PTL is ruled out, since [facility] is not able to complete and read BPPs during the night. Radiology confirmed that they would only be able to do a BPP between the hours of 0800-1700.  Lillyonna's contractions/cramping resolved with IV fluids and a dose of Procardia. FFN was negative. She did not tolerate a cervical exam, and her cervix was quite posterior. I spoke with the charge nurse and MD in the MAU. NICU and OB specialty service are full and would not be able to admit Kalia if delivery or continued observation is indicated, and she would need to transfer to [facility]. Continuous monitoring overnight would be advised at [facility] if she preferred to stay until the AM for BPP.   OB History     Gravida  2   Para  1   Term  1   Preterm  0   AB  0   Living  1      SAB  0   IAB  0   Ectopic  0   Multiple  0   Live Births  1           Scheduled Meds: Continuous Infusions:  lactated ringers      PRN Meds:.acetaminophen , ondansetron , sodium citrate-citric acid   OBJECTIVE  BP 112/70 (BP Location: Left Arm)   Pulse 85   Temp 98.4 F (36.9 C) (Oral)   Resp 16   Ht 5' 2 (1.575 m)   Wt 87.5 kg   LMP 02/23/2024 (Exact Date)   BMI 35.30 kg/m   General: alert, cooperative, NAD Abdomen: soft, gravid,  non-tender Cervical exam: Exam by:: EMERSON Canny CNM   NST I reviewed the NST and it was reactive.  Fetus A Baseline: 140 Variability: moderate Accelerations: present Decelerations:none Category 1  Fetus B Baseline: 135 Variability: moderate Accelerations: present Decelerations: none, although there are some short gaps in the tracing Category 1  Toco: initially ctx q3  ASSESSMENT Impression  1) Di-di twin pregnancy at G2P1001, [redacted]w[redacted]d, Estimated Date of Delivery: 11/29/24 2) Equivocal BPP 3) Reassuring fetal monitoring 4) Preterm contractions, resolved  PLAN 1) Discussed the options of staying overnight for continuous monitoring and BPP in the AM or going to the MAU for a BPP. Michale is aware that she may need to return to [facility] for continuous monitoring if her BPP at MAU is not reassuring. She strongly prefers to go to the MAU tonight. She will go by private vehicle. 2) Discharge with plan to go directly to MAU. 3) Keep scheduled f/u visits.  Eleanor Canny, CNM 10/05/24  9:05 PM

## 2024-10-05 NOTE — Progress Notes (Signed)
 Patient discharged in stable condition by provider. Provider gave pt options of staying here for BPP in the morning or transporting herself to MAU for BPP tonight. Pt decided to go to MAU now with the understanding that they may send her back here for delivery due to their NICU census. Discharge instructions given. Patient verbalized understanding, no questions or concerns. Pt discharged in personal vehicle. Labor precautions given.   Anmarie Fukushima L. Ricci Dirocco, RN BSN 10/05/2024 10:12 PM

## 2024-10-05 NOTE — OB Triage Note (Addendum)
 Pt is a G2P1 at [redacted]w[redacted]d gestation presenting to L/D triage from MFM for extended monitoring due to 6/10 NST results for baby B.  Upon walking into the pt room, she is also complaining of cramping pain in the lower abdomen that has been ongoing since Monday. She rates the pain 6/10 and states that nothing makes it worse/provides relief.  Monitors applied and assessing with the initial FHT of baby A 145 at 1745 and initial FHT of baby B 140 at 1745. Pt denies LOF, VB, and CTX. She endorses feeling both babies moving.  CHRISTELLA Canny CNM aware of pt arrival to the unit.

## 2024-10-06 ENCOUNTER — Inpatient Hospital Stay (HOSPITAL_COMMUNITY)
Admission: AD | Admit: 2024-10-06 | Discharge: 2024-10-06 | Disposition: A | Discharge status: Home / Self Care | Attending: Obstetrics & Gynecology | Admitting: Obstetrics & Gynecology

## 2024-10-06 ENCOUNTER — Inpatient Hospital Stay (HOSPITAL_COMMUNITY)

## 2024-10-06 DIAGNOSIS — O99213 Obesity complicating pregnancy, third trimester: Secondary | ICD-10-CM

## 2024-10-06 DIAGNOSIS — Z3A32 32 weeks gestation of pregnancy: Secondary | ICD-10-CM

## 2024-10-06 DIAGNOSIS — O43193 Other malformation of placenta, third trimester: Secondary | ICD-10-CM | POA: Insufficient documentation

## 2024-10-06 DIAGNOSIS — E669 Obesity, unspecified: Secondary | ICD-10-CM

## 2024-10-06 DIAGNOSIS — O365932 Maternal care for other known or suspected poor fetal growth, third trimester, fetus 2: Secondary | ICD-10-CM | POA: Insufficient documentation

## 2024-10-06 DIAGNOSIS — O289 Unspecified abnormal findings on antenatal screening of mother: Secondary | ICD-10-CM | POA: Diagnosis not present

## 2024-10-06 DIAGNOSIS — O322XX2 Maternal care for transverse and oblique lie, fetus 2: Secondary | ICD-10-CM | POA: Insufficient documentation

## 2024-10-06 DIAGNOSIS — O30043 Twin pregnancy, dichorionic/diamniotic, third trimester: Secondary | ICD-10-CM

## 2024-10-06 DIAGNOSIS — O09293 Supervision of pregnancy with other poor reproductive or obstetric history, third trimester: Secondary | ICD-10-CM | POA: Insufficient documentation

## 2024-10-06 DIAGNOSIS — O321XX1 Maternal care for breech presentation, fetus 1: Secondary | ICD-10-CM | POA: Diagnosis not present

## 2024-10-06 NOTE — MAU Note (Signed)
..  Alyssa [name] is a 28 y.o. at [redacted]w[redacted]d here in MAU from [facility] for a BPP. Denies pain, vaginal bleeding or leaking of fluid. +FM  FHT:US   Lab orders placed from triage:  none

## 2024-10-06 NOTE — MAU Provider Note (Signed)
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 ----------------------------------------------------------------------  Nathanel Fetters, MD Electronically Signed Final Report   10/05/2024 05:01 pm ----------------------------------------------------------------------   US  MFM OB FOLLOW UP ADDL GEST Result Date: 10/05/2024 ----------------------------------------------------------------------  OBSTETRICS REPORT                       (Signed Final 10/05/2024 05:01 pm) ---------------------------------------------------------------------- Patient Info  ID #:       [id number]                          D.O.B.:  [birth year] (28 yrs)(F)  Name:       Alyssa [name]-                Visit Date: 10/05/2024 03:49 pm              [facility] ---------------------------------------------------------------------- Performed By  Attending:        Nathanel Fetters      Ref. Address:     [address]                    MD  Performed By:     Elenor Edu BS      Location[facility]                     RD[facility] RVT                                 [facility] at                                                             [facility]  Referred By:      HARLENE LITTIE CISCO CNM ---------------------------------------------------------------------- Orders  #  Description                           Code        Ordered By  1  US  MFM OB FOLLOW UP                   76816.01    YU FANG  2  US  MFM FETAL BPP                      76818.5     YU FANG     W/NONSTRESS  3  US  MFM UA CORD DOPPLER                76820.02    YU FANG  4  US  MFM OB FOLLOW UP ADDL              23183.97    YU FANG     GEST  5  US  MFM FETAL BPP                      23180.7     YU FANG     W/NONSTRESS ADD'L GEST  6  US  MFM UA ADDL GEST  23179.98    BABARA KEYS ----------------------------------------------------------------------  #  Order #                     Accession #                Episode #  1  [id number]                   [id number]                 [id number]  2  [id number]                   [id number]                 [id number]  3  [id number]                   [id number]                 [id number]  4  [id number]                   [id number]                 [id number]  5  [id number]                   [id number]                 [id number]  6  [id number]                   [id number]                 [id number] ---------------------------------------------------------------------- Indications  [redacted] weeks gestation of pregnancy                Z3A.32  Maternal care for known or suspected poor      O36.5932  fetal growth, third trimester, fetus 2 IUGR  Obesity complicating pregnancy, third          O99.213  trimester  Asthma                                         O99.89 j45.909  Poor obstetric history: Previous               O09.299  preeclampsia / eclampsia/gestational HTN  Poor obstetric history: Previous fetal growth  O09.299  restriction (FGR)  Encounter for other antenatal screening        Z36.2  follow-up  ---------------------------------------------------------------------- Fetal Evaluation (Fetus A)  Num Of Fetuses:         2  Cardiac Activity:       Observed  Fetal Lie:              Lower Fetus  Presentation:           Breech  Placenta:               Anterior  P. Cord Insertion:      Marg insertion previously seen  Membrane Desc:      Dividing Membrane seen - Dichorionic.  Amniotic Fluid  AFI FV:      Within normal limits                              Largest Pocket(cm)  3.94 ---------------------------------------------------------------------- Biophysical Evaluation (Fetus A)  Amniotic F.V:   Pocket => 2 cm             F. Tone:        Observed  F. Movement:    Observed                   N.S.T:          Reactive  F. Breathing:   Not Observed               Score:          8/10 ---------------------------------------------------------------------- Biometry (Fetus A)  BPD:     76.48  mm     G. Age:  30w 5d          8  %    CI:         69.1   %    70 - 86                                                          FL/HC:      20.3   %    19.1 - 21.3  HC:    293.83   mm     G. Age:  32w 3d         21  %    HC/AC:      1.07        0.96 - 1.17  AC:    275.83   mm     G. Age:  31w 5d         34  %    FL/BPD:     77.9   %    71 - 87  FL:      59.59  mm     G. Age:  31w 0d         13  %    FL/AC:      21.6   %    20 - 24  LV:        6.2  mm  Est. FW:    1771  gm    3 lb 14 oz      20  %     FW Discordancy     0 \ 12 % ---------------------------------------------------------------------- OB History  Blood Type:   AB+  Gravidity:    2         Term:   1  Living:       1 ---------------------------------------------------------------------- Gestational Age (Fetus A)  LMP:           32w 1d        Date:  02/23/24                   EDD:   11/29/24  U/S Today:     31w 3d                                        EDD:   12/04/24  Best:          32w 1d     Det. By:  LMP  (02/23/24)  EDD:   11/29/24  ---------------------------------------------------------------------- Anatomy (Fetus A)  Ventricles:            Appears normal         Kidneys:                Appear normal  Stomach:               Appears normal, left   Bladder:                Appears normal                         sided ---------------------------------------------------------------------- Doppler - Fetal Vessels (Fetus A)  Umbilical Artery   S/D     %tile      RI    %tile      PI    %tile     PSV    ADFV    RDFV                                                     (cm/s)   2.82       59    0.65       67    1.03       75    46.49      No      No ---------------------------------------------------------------------- Fetal Evaluation (Fetus B)  Num Of Fetuses:         2  Fetal Heart Rate(bpm):  146  Cardiac Activity:       Observed  Fetal Lie:              Upper Fetus  Presentation:           Transverse, head to maternal right  Placenta:               Posterior  P. Cord Insertion:      Previously seen  Membrane Desc:      Dividing Membrane seen - Dichorionic.  Amniotic Fluid  AFI FV:      Within normal limits                              Largest Pocket(cm)                              4.24 ---------------------------------------------------------------------- Biophysical Evaluation (Fetus B)  Amniotic F.V:   Pocket => 2 cm             F. Tone:        Not Observed  F. Movement:    Observed                   N.S.T:          Reactive  F. Breathing:   Not Observed               Score:          6/10 ---------------------------------------------------------------------- Biometry (Fetus B)  BPD:     74.93  mm     G. Age:  30w 0d        2.6  %    CI:        71.96   %  70 - 86                                                          FL/HC:      20.6   %    19.1 - 21.3  HC:    281.11   mm     G. Age:  30w 6d        1.8  %    HC/AC:      1.07        0.96 - 1.17  AC:    262.56   mm     G. Age:  30w 3d          8  %    FL/BPD:     77.1   %    71 - 87  FL:       57.77  mm     G. Age:  30w 2d          4  %    FL/AC:      22.0   %    20 - 24  LV:          6  mm  Est. FW:    1558  gm      3 lb 7 oz    4.6  %     FW Discordancy        12  % ---------------------------------------------------------------------- Gestational Age (Fetus B)  LMP:           32w 1d        Date:  02/23/24                   EDD:   11/29/24  U/S Today:     30w 3d                                        EDD:   12/11/24  Best:          32w 1d     Det. By:  LMP  (02/23/24)          EDD:   11/29/24 ---------------------------------------------------------------------- Anatomy (Fetus B)  Ventricles:            Appears normal         Kidneys:                Appear normal  Stomach:               Appears normal, left   Bladder:                Appears normal                         sided ---------------------------------------------------------------------- Doppler - Fetal Vessels (Fetus B)  Umbilical Artery   S/D     %tile      RI    %tile      PI    %tile     PSV    ADFV    RDFV                                                     (  cm/s)   3.44       86    0.71       87    1.18       91    43.85      No      No ---------------------------------------------------------------------- Cervix Uterus Adnexa  Cervix  Not visualized (advanced GA >24wks)  Uterus  No abnormality visualized.  Right Ovary  Not visualized.  Left Ovary  Not visualized.  Cul De Sac  No free fluid seen.  Adnexa  No abnormality visualized ---------------------------------------------------------------------- Impression  Follow up growth in dichorionic diaminotic twin pregnancy  due to FGR in twin  B. (previoulsy both twins).  Positive interval growth with measurements consistent with  dates in FGR in twin B now 5% instead of <1%.  Good fetal movement and amniotic fluid volume noted  The UAD were normal  without evidence of AEDF or REDF.  The twin discordance is 12% today.  BPP 8/10 in Tin A  BPP 6/10 for Twin B  I recommend going to L&D for further  monitoring with repeat  BPP in 4-6 hours. If still equivocal give BMZ and repeat BPP  in the morning. Please call MFM for further management as  needed.  I discussed the plan of care with Eleanor Canny CNM. ---------------------------------------------------------------------- Recommendations  To L&D  If dischareged continue 2x weekly testing with testing at our  office to include BPP/NST and UAD. At her providers  NST/AFI is sufficient.  Delivery  36 weeks. ----------------------------------------------------------------------               Nathanel Fetters, MD Electronically Signed Final Report   10/05/2024 05:01 pm ----------------------------------------------------------------------   US  MFM UA ADDL GEST Result Date: 10/05/2024 ----------------------------------------------------------------------  OBSTETRICS REPORT                       (Signed Final 10/05/2024 05:01 pm) ---------------------------------------------------------------------- Patient Info  ID #:       [id number]                          D.O.B.:  Apr 07, 1996 (28 yrs)(F)  Name:       Alyssa [name]-                Visit Date: 10/05/2024 03:49 pm              [facility] ---------------------------------------------------------------------- Performed By  Attending:        Nathanel Fetters      Ref. Address:     [address] [address]                    MD  Performed By:     Elenor Edu BS      Location[facility]         [facility]                    RDMS RVT                                 [facility] at  [facility]  Referred By:      HARLENE LITTIE CISCO CNM ---------------------------------------------------------------------- Orders  #  Description                           Code        Ordered By  1  US  MFM OB FOLLOW UP                   76816.01    YU FANG  2  US  MFM FETAL BPP                      76818.5     YU FANG     W/NONSTRESS  3  US  MFM UA CORD DOPPLER                 76820.02    YU FANG  4  US  MFM OB FOLLOW UP ADDL              23183.97    YU FANG     GEST  5  US  MFM FETAL BPP                      76819.2     YU FANG     W/NONSTRESS ADD'L GEST  6  US  MFM UA ADDL GEST                   76820.01    YU FANG ----------------------------------------------------------------------  #  Order #                     Accession #                Episode #  1  [id number]                   [id number]                 [id number]  2  [id number]                   [id number]                 [id number]  3  [id number]                   [id number]                 [id number]  4  [id number]                   [id number]                 [id number]  5  [id number]                   [id number]                 [id number]  6  [id number]                   [id number]                 [id number] ---------------------------------------------------------------------- Indications  [redacted] weeks gestation of pregnancy                Z3A.32  Maternal care for known or suspected  poor      [id number]  fetal growth, third trimester, fetus 2 IUGR  Obesity complicating pregnancy, third          O99.213  trimester  Asthma                                         O99.89 j45.909  Poor obstetric history: Previous               O09.299  preeclampsia / eclampsia/gestational HTN  Poor obstetric history: Previous fetal growth  O09.299  restriction (FGR)  Encounter for other antenatal screening        Z36.2  follow-up ---------------------------------------------------------------------- Fetal Evaluation (Fetus A)  Num Of Fetuses:         2  Cardiac Activity:       Observed  Fetal Lie:              Lower Fetus  Presentation:           Breech  Placenta:               Anterior  P. Cord Insertion:      Marg insertion previously seen  Membrane Desc:      Dividing Membrane seen - Dichorionic.  Amniotic Fluid  AFI FV:      Within normal limits                              Largest Pocket(cm)                              3.94  ---------------------------------------------------------------------- Biophysical Evaluation (Fetus A)  Amniotic F.V:   Pocket => 2 cm             F. Tone:        Observed  F. Movement:    Observed                   N.S.T:          Reactive  F. Breathing:   Not Observed               Score:          8/10 ---------------------------------------------------------------------- Biometry (Fetus A)  BPD:     76.48  mm     G. Age:  30w 5d          8  %    CI:         69.1   %    70 - 86                                                          FL/HC:      20.3   %    19.1 - 21.3  HC:    293.83   mm     G. Age:  32w 3d         21  %    HC/AC:      1.07        0.96 - 1.17  AC:    275.83   mm  G. Age:  31w 5d         34  %    FL/BPD:     77.9   %    71 - 87  FL:      59.59  mm     G. Age:  31w 0d         13  %    FL/AC:      21.6   %    20 - 24  LV:        6.2  mm  Est. FW:    1771  gm    3 lb 14 oz      20  %     FW Discordancy     0 \ 12 % ---------------------------------------------------------------------- OB History  Blood Type:   AB+  Gravidity:    2         Term:   1  Living:       1 ---------------------------------------------------------------------- Gestational Age (Fetus A)  LMP:           32w 1d        Date:  02/23/24                   EDD:   11/29/24  U/S Today:     31w 3d                                        EDD:   12/04/24  Best:          32w 1d     Det. By:  LMP  (02/23/24)          EDD:   11/29/24 ---------------------------------------------------------------------- Anatomy (Fetus A)  Ventricles:            Appears normal         Kidneys:                Appear normal  Stomach:               Appears normal, left   Bladder:                Appears normal                         sided ---------------------------------------------------------------------- Doppler - Fetal Vessels (Fetus A)  Umbilical Artery   S/D     %tile      RI    %tile      PI    %tile     PSV    ADFV    RDFV                                                      (cm/s)   2.82       59    0.65       67    1.03       75    46.49      No      No ---------------------------------------------------------------------- Fetal Evaluation (Fetus B)  Num Of Fetuses:         2  Fetal Heart Rate(bpm):  146  Cardiac Activity:  Observed  Fetal Lie:              Upper Fetus  Presentation:           Transverse, head to maternal right  Placenta:               Posterior  P. Cord Insertion:      Previously seen  Membrane Desc:      Dividing Membrane seen - Dichorionic.  Amniotic Fluid  AFI FV:      Within normal limits                              Largest Pocket(cm)                              4.24 ---------------------------------------------------------------------- Biophysical Evaluation (Fetus B)  Amniotic F.V:   Pocket => 2 cm             F. Tone:        Not Observed  F. Movement:    Observed                   N.S.T:          Reactive  F. Breathing:   Not Observed               Score:          6/10 ---------------------------------------------------------------------- Biometry (Fetus B)  BPD:     74.93  mm     G. Age:  30w 0d        2.6  %    CI:        71.96   %    70 - 86                                                          FL/HC:      20.6   %    19.1 - 21.3  HC:    281.11   mm     G. Age:  30w 6d        1.8  %    HC/AC:      1.07        0.96 - 1.17  AC:    262.56   mm     G. Age:  30w 3d          8  %    FL/BPD:     77.1   %    71 - 87  FL:      57.77  mm     G. Age:  30w 2d          4  %    FL/AC:      22.0   %    20 - 24  LV:          6  mm  Est. FW:    1558  gm      3 lb 7 oz    4.6  %     FW Discordancy        12  % ---------------------------------------------------------------------- Gestational Age (Fetus B)  LMP:  32w 1d        Date:  02/23/24                   EDD:   11/29/24  U/S Today:     30w 3d                                        EDD:   12/11/24  Best:          32w 1d     Det. By:  LMP  (02/23/24)          EDD:   11/29/24  ---------------------------------------------------------------------- Anatomy (Fetus B)  Ventricles:            Appears normal         Kidneys:                Appear normal  Stomach:               Appears normal, left   Bladder:                Appears normal                         sided ---------------------------------------------------------------------- Doppler - Fetal Vessels (Fetus B)  Umbilical Artery   S/D     %tile      RI    %tile      PI    %tile     PSV    ADFV    RDFV                                                     (cm/s)   3.44       86    0.71       87    1.18       91    43.85      No      No ---------------------------------------------------------------------- Cervix Uterus Adnexa  Cervix  Not visualized (advanced GA >24wks)  Uterus  No abnormality visualized.  Right Ovary  Not visualized.  Left Ovary  Not visualized.  Cul De Sac  No free fluid seen.  Adnexa  No abnormality visualized ---------------------------------------------------------------------- Impression  Follow up growth in dichorionic diaminotic twin pregnancy  due to FGR in twin  B. (previoulsy both twins).  Positive interval growth with measurements consistent with  dates in FGR in twin B now 5% instead of <1%.  Good fetal movement and amniotic fluid volume noted  The UAD were normal  without evidence of AEDF or REDF.  The twin discordance is 12% today.  BPP 8/10 in Tin A  BPP 6/10 for Twin B  I recommend going to L&D for further monitoring with repeat  BPP in 4-6 hours. If still equivocal give BMZ and repeat BPP  in the morning. Please call MFM for further management as  needed.  I discussed the plan of care with Eleanor Canny CNM. ---------------------------------------------------------------------- Recommendations  To L&D  If dischareged continue 2x weekly testing with testing at our  office to include BPP/NST and UAD. At her providers  NST/AFI is sufficient.  Delivery  36 weeks.  ----------------------------------------------------------------------  Nathanel Fetters, MD Electronically Signed Final Report   10/05/2024 05:01 pm ----------------------------------------------------------------------   US  MFM FETAL BPP W/NONSTRESS ADD'L GEST Result Date: 10/05/2024 ----------------------------------------------------------------------  OBSTETRICS REPORT                       (Signed Final 10/05/2024 05:01 pm) ---------------------------------------------------------------------- Patient Info  ID #:       [id number]                          D.O.B.:  05/24/1996 (28 yrs)(F)  Name:       Alyssa [name]-                Visit Date: 10/05/2024 03:49 pm              [facility] ---------------------------------------------------------------------- Performed By  Attending:        Nathanel Fetters      Ref. Address:     [facility]                    MD  Performed By:     Elenor Edu BS      [facility]                    RDMS RVT                                 [facility] at                                                             [facility]  Referred By:      HARLENE LITTIE CISCO CNM ---------------------------------------------------------------------- Orders  #  Description                           Code        Ordered By  1  US  MFM OB FOLLOW UP                   76816.01    YU FANG  2  US  MFM FETAL BPP                      76818.5     YU FANG     W/NONSTRESS  3  US  MFM UA CORD DOPPLER                76820.02    YU FANG  4  US  MFM OB FOLLOW UP ADDL              23183.97    YU FANG     GEST  5  US  MFM FETAL BPP                      23180.7     YU FANG     W/NONSTRESS ADD'L GEST  6  US  MFM UA ADDL GEST  23179.98    BABARA KEYS ----------------------------------------------------------------------  #  Order #                     Accession #                Episode #  1  [id number]                   [id number]                  [id number]  2  [id number]                   [id number]                 [id number]  3  [id number]                   [id number]                 [id number]  4  [id number]                   [id number]                 [id number]  5  [id number]                   [id number]                 [id number]  6  [id number]                   [id number]                 [id number] ---------------------------------------------------------------------- Indications  [redacted] weeks gestation of pregnancy                Z3A.32  Maternal care for known or suspected poor      O36.5932  fetal growth, third trimester, fetus 2 IUGR  Obesity complicating pregnancy, third          O99.213  trimester  Asthma                                         O99.89 j45.909  Poor obstetric history: Previous               O09.299  preeclampsia / eclampsia/gestational HTN  Poor obstetric history: Previous fetal growth  O09.299  restriction (FGR)  Encounter for other antenatal screening        Z36.2  follow-up ---------------------------------------------------------------------- Fetal Evaluation (Fetus A)  Num Of Fetuses:         2  Cardiac Activity:       Observed  Fetal Lie:              Lower Fetus  Presentation:           Breech  Placenta:               Anterior  P. Cord Insertion:      Marg insertion previously seen  Membrane Desc:      Dividing Membrane seen - Dichorionic.  Amniotic Fluid  AFI FV:      Within normal limits                              Largest Pocket(cm)  3.94 ---------------------------------------------------------------------- Biophysical Evaluation (Fetus A)  Amniotic F.V:   Pocket => 2 cm             F. Tone:        Observed  F. Movement:    Observed                   N.S.T:          Reactive  F. Breathing:   Not Observed               Score:          8/10 ---------------------------------------------------------------------- Biometry (Fetus A)  BPD:     76.48  mm     G. Age:  30w 5d          8  %    CI:         69.1   %     70 - 86                                                          FL/HC:      20.3   %    19.1 - 21.3  HC:    293.83   mm     G. Age:  32w 3d         21  %    HC/AC:      1.07        0.96 - 1.17  AC:    275.83   mm     G. Age:  31w 5d         34  %    FL/BPD:     77.9   %    71 - 87  FL:      59.59  mm     G. Age:  31w 0d         13  %    FL/AC:      21.6   %    20 - 24  LV:        6.2  mm  Est. FW:    1771  gm    3 lb 14 oz      20  %     FW Discordancy     0 \ 12 % ---------------------------------------------------------------------- OB History  Blood Type:   AB+  Gravidity:    2         Term:   1  Living:       1 ---------------------------------------------------------------------- Gestational Age (Fetus A)  LMP:           32w 1d        Date:  02/23/24                   EDD:   11/29/24  U/S Today:     31w 3d                                        EDD:   12/04/24  Best:          32w 1d     Det. By:  LMP  (02/23/24)  EDD:   11/29/24 ---------------------------------------------------------------------- Anatomy (Fetus A)  Ventricles:            Appears normal         Kidneys:                Appear normal  Stomach:               Appears normal, left   Bladder:                Appears normal                         sided ---------------------------------------------------------------------- Doppler - Fetal Vessels (Fetus A)  Umbilical Artery   S/D     %tile      RI    %tile      PI    %tile     PSV    ADFV    RDFV                                                     (cm/s)   2.82       59    0.65       67    1.03       75    46.49      No      No ---------------------------------------------------------------------- Fetal Evaluation (Fetus B)  Num Of Fetuses:         2  Fetal Heart Rate(bpm):  146  Cardiac Activity:       Observed  Fetal Lie:              Upper Fetus  Presentation:           Transverse, head to maternal right  Placenta:               Posterior  P. Cord Insertion:      Previously seen  Membrane Desc:       Dividing Membrane seen - Dichorionic.  Amniotic Fluid  AFI FV:      Within normal limits                              Largest Pocket(cm)                              4.24 ---------------------------------------------------------------------- Biophysical Evaluation (Fetus B)  Amniotic F.V:   Pocket => 2 cm             F. Tone:        Not Observed  F. Movement:    Observed                   N.S.T:          Reactive  F. Breathing:   Not Observed               Score:          6/10 ---------------------------------------------------------------------- Biometry (Fetus B)  BPD:     74.93  mm     G. Age:  30w 0d        2.6  %    CI:        71.96   %  70 - 86                                                          FL/HC:      20.6   %    19.1 - 21.3  HC:    281.11   mm     G. Age:  30w 6d        1.8  %    HC/AC:      1.07        0.96 - 1.17  AC:    262.56   mm     G. Age:  30w 3d          8  %    FL/BPD:     77.1   %    71 - 87  FL:      57.77  mm     G. Age:  30w 2d          4  %    FL/AC:      22.0   %    20 - 24  LV:          6  mm  Est. FW:    1558  gm      3 lb 7 oz    4.6  %     FW Discordancy        12  % ---------------------------------------------------------------------- Gestational Age (Fetus B)  LMP:           32w 1d        Date:  02/23/24                   EDD:   11/29/24  U/S Today:     30w 3d                                        EDD:   12/11/24  Best:          32w 1d     Det. By:  LMP  (02/23/24)          EDD:   11/29/24 ---------------------------------------------------------------------- Anatomy (Fetus B)  Ventricles:            Appears normal         Kidneys:                Appear normal  Stomach:               Appears normal, left   Bladder:                Appears normal                         sided ---------------------------------------------------------------------- Doppler - Fetal Vessels (Fetus B)  Umbilical Artery   S/D     %tile      RI    %tile      PI    %tile     PSV    ADFV    RDFV                                                      (  cm/s)   3.44       86    0.71       87    1.18       91    43.85      No      No ---------------------------------------------------------------------- Cervix Uterus Adnexa  Cervix  Not visualized (advanced GA >24wks)  Uterus  No abnormality visualized.  Right Ovary  Not visualized.  Left Ovary  Not visualized.  Cul De Sac  No free fluid seen.  Adnexa  No abnormality visualized ---------------------------------------------------------------------- Impression  Follow up growth in dichorionic diaminotic twin pregnancy  due to FGR in twin  B. (previoulsy both twins).  Positive interval growth with measurements consistent with  dates in FGR in twin B now 5% instead of <1%.  Good fetal movement and amniotic fluid volume noted  The UAD were normal  without evidence of AEDF or REDF.  The twin discordance is 12% today.  BPP 8/10 in Tin A  BPP 6/10 for Twin B  I recommend going to L&D for further monitoring with repeat  BPP in 4-6 hours. If still equivocal give BMZ and repeat BPP  in the morning. Please call MFM for further management as  needed.  I discussed the plan of care with Eleanor Canny CNM. ---------------------------------------------------------------------- Recommendations  To L&D  If dischareged continue 2x weekly testing with testing at our  office to include BPP/NST and UAD. At her providers  NST/AFI is sufficient.  Delivery  36 weeks. ----------------------------------------------------------------------               Nathanel Fetters, MD Electronically Signed Final Report   10/05/2024 05:01 pm ----------------------------------------------------------------------   US  MFM FETAL BPP W/NONSTRESS Result Date: 10/05/2024 ----------------------------------------------------------------------  OBSTETRICS REPORT                       (Signed Final 10/05/2024 05:01 pm) ---------------------------------------------------------------------- Patient Info  ID #:        [id number]                          D.O.B.:  09-15-1996 (28 yrs)(F)  Name:       Alyssa [name]-                Visit Date: 10/05/2024 03:49 pm              [facility] ---------------------------------------------------------------------- Performed By  Attending:        Nathanel Fetters      Ref. Address:     [address] [address]                    MD  Performed By:     Elenor Edu BS      Location[facility]                                 [facility]  Referred By:      HARLENE LITTIE CISCO CNM ---------------------------------------------------------------------- Orders  #  Description                           Code        Ordered By  1  US  MFM OB FOLLOW UP                   76816.01    YU FANG  2  US  MFM FETAL BPP                      76818.5     YU FANG     W/NONSTRESS  3  US  MFM UA CORD DOPPLER                76820.02    YU FANG  4  US  MFM OB FOLLOW UP ADDL              23183.97    YU FANG     GEST  5  US  MFM FETAL BPP                      76819.2     YU FANG     W/NONSTRESS ADD'L GEST  6  US  MFM UA ADDL GEST                   76820.01    YU FANG ----------------------------------------------------------------------  #  Order #                     Accession #                Episode #  1  [id number]                   [id number]                 [id number]  2  [id number]                   [id number]                 [id number]  3  [id number]                   [id number]                 [id number]  4  [id number]                   [id number]                 [id number]  5  [id number]                   [id number]                 [id number]  6  [id number]                   [id number]                 [id number] ---------------------------------------------------------------------- Indications  [redacted] weeks gestation of pregnancy                Z3A.32  Maternal care for known or suspected poor  N63.4067  fetal growth, third trimester, fetus 2 IUGR  Obesity complicating pregnancy, third          O99.213  trimester  Asthma                                         O99.89 j45.909  Poor obstetric history: Previous               O09.299  preeclampsia / eclampsia/gestational HTN  Poor obstetric history: Previous fetal growth  O09.299  restriction (FGR)  Encounter for other antenatal screening        Z36.2  follow-up ---------------------------------------------------------------------- Fetal Evaluation (Fetus A)  Num Of Fetuses:         2  Cardiac Activity:       Observed  Fetal Lie:              Lower Fetus  Presentation:           Breech  Placenta:               Anterior  P. Cord Insertion:      Marg insertion previously seen  Membrane Desc:      Dividing Membrane seen - Dichorionic.  Amniotic Fluid  AFI FV:      Within normal limits                              Largest Pocket(cm)                              3.94 ---------------------------------------------------------------------- Biophysical Evaluation (Fetus A)  Amniotic F.V:   Pocket => 2 cm             F. Tone:        Observed  F. Movement:    Observed                   N.S.T:          Reactive  F. Breathing:   Not Observed               Score:          8/10 ---------------------------------------------------------------------- Biometry (Fetus A)  BPD:     76.48  mm     G. Age:  30w 5d          8  %    CI:         69.1   %    70 - 86                                                          FL/HC:      20.3   %    19.1 - 21.3  HC:    293.83   mm     G. Age:  32w 3d         21  %    HC/AC:      1.07        0.96 - 1.17  AC:    275.83   mm     G. Age:  31w 5d  34  %    FL/BPD:     77.9   %    71 - 87  FL:      59.59  mm     G. Age:  31w 0d         13  %    FL/AC:      21.6   %    20 - 24  LV:        6.2  mm  Est. FW:    1771  gm    3 lb 14 oz      20  %     FW Discordancy     0 \ 12 %  ---------------------------------------------------------------------- OB History  Blood Type:   AB+  Gravidity:    2         Term:   1  Living:       1 ---------------------------------------------------------------------- Gestational Age (Fetus A)  LMP:           32w 1d        Date:  02/23/24                   EDD:   11/29/24  U/S Today:     31w 3d                                        EDD:   12/04/24  Best:          32w 1d     Det. By:  LMP  (02/23/24)          EDD:   11/29/24 ---------------------------------------------------------------------- Anatomy (Fetus A)  Ventricles:            Appears normal         Kidneys:                Appear normal  Stomach:               Appears normal, left   Bladder:                Appears normal                         sided ---------------------------------------------------------------------- Doppler - Fetal Vessels (Fetus A)  Umbilical Artery   S/D     %tile      RI    %tile      PI    %tile     PSV    ADFV    RDFV                                                     (cm/s)   2.82       59    0.65       67    1.03       75    46.49      No      No ---------------------------------------------------------------------- Fetal Evaluation (Fetus B)  Num Of Fetuses:         2  Fetal Heart Rate(bpm):  146  Cardiac Activity:       Observed  Fetal Lie:  Upper Fetus  Presentation:           Transverse, head to maternal right  Placenta:               Posterior  P. Cord Insertion:      Previously seen  Membrane Desc:      Dividing Membrane seen - Dichorionic.  Amniotic Fluid  AFI FV:      Within normal limits                              Largest Pocket(cm)                              4.24 ---------------------------------------------------------------------- Biophysical Evaluation (Fetus B)  Amniotic F.V:   Pocket => 2 cm             F. Tone:        Not Observed  F. Movement:    Observed                   N.S.T:          Reactive  F. Breathing:   Not Observed               Score:           6/10 ---------------------------------------------------------------------- Biometry (Fetus B)  BPD:     74.93  mm     G. Age:  30w 0d        2.6  %    CI:        71.96   %    70 - 86                                                          FL/HC:      20.6   %    19.1 - 21.3  HC:    281.11   mm     G. Age:  30w 6d        1.8  %    HC/AC:      1.07        0.96 - 1.17  AC:    262.56   mm     G. Age:  30w 3d          8  %    FL/BPD:     77.1   %    71 - 87  FL:      57.77  mm     G. Age:  30w 2d          4  %    FL/AC:      22.0   %    20 - 24  LV:          6  mm  Est. FW:    1558  gm      3 lb 7 oz    4.6  %     FW Discordancy        12  % ---------------------------------------------------------------------- Gestational Age (Fetus B)  LMP:           32w 1d        Date:  02/23/24  EDD:   11/29/24  U/S Today:     30w 3d                                        EDD:   12/11/24  Best:          32w 1d     Det. By:  LMP  (02/23/24)          EDD:   11/29/24 ---------------------------------------------------------------------- Anatomy (Fetus B)  Ventricles:            Appears normal         Kidneys:                Appear normal  Stomach:               Appears normal, left   Bladder:                Appears normal                         sided ---------------------------------------------------------------------- Doppler - Fetal Vessels (Fetus B)  Umbilical Artery   S/D     %tile      RI    %tile      PI    %tile     PSV    ADFV    RDFV                                                     (cm/s)   3.44       86    0.71       87    1.18       91    43.85      No      No ---------------------------------------------------------------------- Cervix Uterus Adnexa  Cervix  Not visualized (advanced GA >24wks)  Uterus  No abnormality visualized.  Right Ovary  Not visualized.  Left Ovary  Not visualized.  Cul De Sac  No free fluid seen.  Adnexa  No abnormality visualized  ---------------------------------------------------------------------- Impression  Follow up growth in dichorionic diaminotic twin pregnancy  due to FGR in twin  B. (previoulsy both twins).  Positive interval growth with measurements consistent with  dates in FGR in twin B now 5% instead of <1%.  Good fetal movement and amniotic fluid volume noted  The UAD were normal  without evidence of AEDF or REDF.  The twin discordance is 12% today.  BPP 8/10 in Tin A  BPP 6/10 for Twin B  I recommend going to L&D for further monitoring with repeat  BPP in 4-6 hours. If still equivocal give BMZ and repeat BPP  in the morning. Please call MFM for further management as  needed.  I discussed the plan of care with Eleanor Canny CNM. ---------------------------------------------------------------------- Recommendations  To L&D  If dischareged continue 2x weekly testing with testing at our  office to include BPP/NST and UAD. At her providers  NST/AFI is sufficient.  Delivery  36 weeks. ----------------------------------------------------------------------               Nathanel Fetters, MD Electronically Signed Final Report   10/05/2024 05:01 pm ----------------------------------------------------------------------    MAU Course  Procedures Lab  Orders  No laboratory test(s) ordered today   No orders of the defined types were placed in this encounter.   Imaging Orders         US  MFM FETAL BPP WO NON STRESS         US  MFM FETAL BPP WO NST ADDL GESTATION     MDM Low (Level 2)  Assessment and Plan  #[redacted] weeks gestation of pregnancy #Di-Di Twin Pregnancy #Abnormal antenatal testing  Patient [facility] from [facility] for repeat BPP in setting of di-di twin gestation. Patient has no additional complaints or concerns.   -Baby A BPP 8/8 -Baby B BPP 8/8   -Stable to discharge home -Encouraged to keep follow up appointment on 11/14.        Tavaris Eudy L Arzell Mcgeehan, MD/MHA 10/06/24 1:17 AM  Allergies as of 10/06/2024        Reactions   Shellfish Allergy Hives   Throat closes   Banana Itching, Swelling   Peach Flavoring Agent (non-screening) Itching        Medication List     TAKE these medications    ACCRUFeR 30 MG Caps Generic drug: Ferric Maltol Take 1 capsule (30 mg total) by mouth 2 (two) times daily between meals. Take one hour before a meal or 2 hours after.   aspirin  EC 81 MG tablet Take 2 tablets (162 mg total) by mouth daily. Take after 12 weeks for prevention of preeclampssia later in pregnancy   multivitamin-prenatal 27-0.8 MG Tabs tablet Take 1 tablet by mouth daily at 12 noon.

## 2024-10-07 ENCOUNTER — Other Ambulatory Visit: Payer: Self-pay | Admitting: *Deleted

## 2024-10-07 ENCOUNTER — Other Ambulatory Visit

## 2024-10-07 DIAGNOSIS — O30043 Twin pregnancy, dichorionic/diamniotic, third trimester: Secondary | ICD-10-CM

## 2024-10-07 DIAGNOSIS — O365932 Maternal care for other known or suspected poor fetal growth, third trimester, fetus 2: Secondary | ICD-10-CM

## 2024-10-10 ENCOUNTER — Other Ambulatory Visit: Payer: Self-pay | Admitting: *Deleted

## 2024-10-10 ENCOUNTER — Ambulatory Visit

## 2024-10-10 ENCOUNTER — Other Ambulatory Visit

## 2024-10-10 DIAGNOSIS — O30043 Twin pregnancy, dichorionic/diamniotic, third trimester: Secondary | ICD-10-CM

## 2024-10-10 DIAGNOSIS — O365932 Maternal care for other known or suspected poor fetal growth, third trimester, fetus 2: Secondary | ICD-10-CM

## 2024-10-10 NOTE — Progress Notes (Signed)
 Opened in error

## 2024-10-12 ENCOUNTER — Ambulatory Visit (INDEPENDENT_AMBULATORY_CARE_PROVIDER_SITE_OTHER): Admitting: Obstetrics & Gynecology

## 2024-10-12 ENCOUNTER — Observation Stay
Admission: RE | Admit: 2024-10-12 | Discharge: 2024-10-12 | Disposition: A | Discharge status: Home / Self Care | Attending: Obstetrics & Gynecology | Admitting: Obstetrics & Gynecology

## 2024-10-12 ENCOUNTER — Other Ambulatory Visit: Payer: Self-pay

## 2024-10-12 ENCOUNTER — Encounter: Payer: Self-pay | Admitting: Obstetrics & Gynecology

## 2024-10-12 VITALS — BP 115/78 | HR 108 | Wt 193.7 lb

## 2024-10-12 DIAGNOSIS — Z3A33 33 weeks gestation of pregnancy: Secondary | ICD-10-CM | POA: Diagnosis not present

## 2024-10-12 DIAGNOSIS — O1414 Severe pre-eclampsia complicating childbirth: Secondary | ICD-10-CM

## 2024-10-12 DIAGNOSIS — O30043 Twin pregnancy, dichorionic/diamniotic, third trimester: Secondary | ICD-10-CM | POA: Diagnosis not present

## 2024-10-12 DIAGNOSIS — O368332 Maternal care for abnormalities of the fetal heart rate or rhythm, third trimester, fetus 2: Secondary | ICD-10-CM | POA: Diagnosis present

## 2024-10-12 DIAGNOSIS — O0993 Supervision of high risk pregnancy, unspecified, third trimester: Principal | ICD-10-CM

## 2024-10-12 DIAGNOSIS — O99210 Obesity complicating pregnancy, unspecified trimester: Secondary | ICD-10-CM | POA: Diagnosis not present

## 2024-10-12 DIAGNOSIS — O365922 Maternal care for other known or suspected poor fetal growth, second trimester, fetus 2: Secondary | ICD-10-CM

## 2024-10-12 DIAGNOSIS — O43193 Other malformation of placenta, third trimester: Secondary | ICD-10-CM | POA: Insufficient documentation

## 2024-10-12 DIAGNOSIS — O99013 Anemia complicating pregnancy, third trimester: Secondary | ICD-10-CM

## 2024-10-12 NOTE — OB Triage Note (Signed)
 LABOR & DELIVERY OB TRIAGE NOTE  SUBJECTIVE  HPI Alyssa [name] is a 28 y.o. G2P1001 at [redacted]w[redacted]d who presents to Labor & Delivery for an NST for di-di twins  OB History     Gravida  2   Para  1   Term  1   Preterm  0   AB  0   Living  1      SAB  0   IAB  0   Ectopic  0   Multiple  0   Live Births  1            OBJECTIVE  BP 109/60   Pulse (!) 105   Temp 98.4 F (36.9 C)   Resp 16   Ht 5' 2 (1.575 m)   Wt 87.5 kg   LMP 02/23/2024 (Exact Date)   BMI 35.30 kg/m    NST I reviewed the NST and it was reactive.  Fetal Heart rate A Baseline:145 Variability: moderate Accelerations: Decelerations:none Toco: none Category 1  Fetal Heart rate B Baseline:145 Variability: moderate Accelerations: Decelerations:none Toco: none Category 1  ASSESSMENT Impression  1) Pregnancy at G2P1001, [redacted]w[redacted]d, Estimated Date of Delivery: 11/29/24 2) Reassuring maternal/fetal status  PLAN 1) Discharge home with standard labor/return precautions 2) Keep scheduled ROB appt/MFM appointments   Damien PARSLEY, CNM  10/12/24  4:19 PM

## 2024-10-12 NOTE — Progress Notes (Signed)
Patient discharged home, discharge instructions given, patient states understanding. Patient left floor in stable condition, denies any other needs at this time. Patient to keep next scheduled OB appointment 

## 2024-10-12 NOTE — OB Triage Note (Signed)
 Pt is a G2P1 at [redacted]w[redacted]d gestation reporting to L/D triage for a nonreactive NST in the office.   Pt reports +FM and denies LOF/VB/CTX.  Monitors applied and assessing with initial fht of baby A 160 at 1525 and initial fht of baby B 155 at 1525.  E Slaughterbeck CNM aware of pt arrival to unit.

## 2024-10-12 NOTE — Progress Notes (Signed)
 PRENATAL VISIT NOTE  Subjective:  Alyssa [name] is a 28 y.o. G68P1001 (5 yo son) at [redacted]w[redacted]d being seen today for ongoing prenatal care.  She is currently monitored for the following issues for this high-risk pregnancy and has Supervision of high-risk pregnancy; H/o pre-eclampsia; Mood disorder; Asthma, mild intermittent; Dichorionic diamniotic twin gestation; Obesity affecting pregnancy, antepartum; IUGR (intrauterine growth restriction) affecting care of mother, second trimester, fetus 2; Vulvovaginal candidiasis; Anemia affecting pregnancy in third trimester; and Marginal insertion of umbilical cord affecting management of mother in third trimester on their problem list.  Patient reports pelvic pressure, increased recently.  Contractions: Irritability. Vag. Bleeding: None.  Movement: Present. Denies leaking of fluid.  The following portions of the patient's history were reviewed and updated as appropriate: allergies, current medications, past family history, past medical history, past social history, past surgical history and problem list.   Objective:   Vitals:   10/12/24 1128  BP: 115/78  Pulse: (!) 108  Weight: 193 lb 11.2 oz (87.9 kg)    Fetal Status:      Movement: Present    General: Alert, oriented and cooperative. Patient is in no acute distress.  Skin: Skin is warm and dry. No rash noted.   Cardiovascular: Normal heart rate noted  Respiratory: Normal respiratory effort, no problems with respiration noted  Abdomen: Soft, gravid, appropriate for gestational age.  Pain/Pressure: Present     Pelvic: Cervix closed/thick/high        Extremities: Normal range of motion.  Edema: None  Mental Status: Normal mood and affect. Normal behavior. Normal judgment and thought content.      04/04/2024    4:38 PM 02/08/2024    1:28 PM  Depression screen PHQ 2/9  Decreased Interest 0 0  Down, Depressed, Hopeless 0 0  PHQ - 2 Score 0 0         No data to display           Assessment and Plan:  Pregnancy: G2P1001 at 109w1d 1. [redacted] weeks gestation of pregnancy (Primary)  2. Supervision of high risk pregnancy in third trimester  3. Dichorionic diamniotic twin pregnancy in third trimester  4. Obesity affecting pregnancy, antepartum, unspecified obesity type   5. IUGR (intrauterine growth restriction) affecting care of mother, second trimester, fetus 2 - she is supposed to be getting weekly NST at [facility] and weekly BPP with MFM. Her last testing was BPP on the 13th.  Twin B is currently at 5th % per latest scan  We attempted to get a NST here in the office today but was unable to do so. Therefore she will go to L&D for further monitoring.  6. Anemia affecting pregnancy in third trimester  7. H/o pre-eclampsia - baby asa daily  8. Marginal insertion of umbilical cord affecting management of mother in third trimester  9. Baby A is breech and Baby B is currently transverse - I explained that if this continues, then she will need a PLTCS.   Preterm labor symptoms and general obstetric precautions including but not limited to vaginal bleeding, contractions, leaking of fluid and fetal movement were reviewed in detail with the patient. Please refer to After Visit Summary for other counseling recommendations.   No follow-ups on file.  Future Appointments  Date Time Provider Department Center  10/14/2024 11:15 AM [facility] US  1 [facility] None  10/14/2024  1:15 PM Slaughterbeck, Damien, CNM [facility] None  10/17/2024 10:15 AM [facility] NST [facility] None  10/17/2024 11:00 AM [facility]  [facility]  10/19/2024 10:15 AM [facility] US  1 [facility] None  10/19/2024 11:15 AM Charma Domino, CNM [facility] None  10/26/2024 10:15 AM [facility] NST [facility] None  10/26/2024 11:00 AM [facility] US1 [facility]  10/26/2024 11:15 AM [facility] US  1 [facility] None  10/26/2024  1:55 PM Dominic, [name], CNM [facility] None  11/03/2024 10:15 AM [facility] US  1 [facility] None   11/03/2024 11:15 AM [name], CNM [facility] None  11/07/2024  2:00 PM [facility]1 [facility]  11/07/2024  3:15 PM [facility] NST [facility] None  11/11/2024 10:15 AM [facility] US  1 [facility] None  11/11/2024 11:15 AM [name], CNM [facility] None    Harland JAYSON Birkenhead, MD

## 2024-10-12 NOTE — Discharge Instructions (Signed)
 Keep you next scheduled follow up for 11/21.  Call your provider or return to the ED if you have Decreased fetal movement, labor signs or symptoms, vaginal bleeding, or any other concerns.

## 2024-10-14 ENCOUNTER — Encounter: Admitting: Certified Nurse Midwife

## 2024-10-14 ENCOUNTER — Other Ambulatory Visit

## 2024-10-14 NOTE — Progress Notes (Deleted)
 Talked to Latish on the phone about visits today. She did not come to her BPP because she believed that Dr. Starla told her that we would not need to see her anymore this week. She has an upcoming MFM BPP/AUD on Monday at 1015. We reviewed her upcoming appointment, who there were with and what to expect. At this point, she would like to schedule a pC/D at 36 weeks due to babies positioning. She is scheduled to see Lauraine Lakes next week with no MD appointments available. Will reach out to Dr. Leigh about the need to get her scheduled for a pC/D.

## 2024-10-17 ENCOUNTER — Ambulatory Visit (HOSPITAL_BASED_OUTPATIENT_CLINIC_OR_DEPARTMENT_OTHER): Admitting: Maternal & Fetal Medicine

## 2024-10-17 ENCOUNTER — Ambulatory Visit: Attending: Maternal & Fetal Medicine

## 2024-10-17 ENCOUNTER — Ambulatory Visit

## 2024-10-17 DIAGNOSIS — O09293 Supervision of pregnancy with other poor reproductive or obstetric history, third trimester: Secondary | ICD-10-CM | POA: Diagnosis not present

## 2024-10-17 DIAGNOSIS — O365922 Maternal care for other known or suspected poor fetal growth, second trimester, fetus 2: Secondary | ICD-10-CM

## 2024-10-17 DIAGNOSIS — O30043 Twin pregnancy, dichorionic/diamniotic, third trimester: Secondary | ICD-10-CM

## 2024-10-17 DIAGNOSIS — O365932 Maternal care for other known or suspected poor fetal growth, third trimester, fetus 2: Secondary | ICD-10-CM | POA: Diagnosis not present

## 2024-10-17 DIAGNOSIS — Z3A33 33 weeks gestation of pregnancy: Secondary | ICD-10-CM

## 2024-10-17 DIAGNOSIS — O99213 Obesity complicating pregnancy, third trimester: Secondary | ICD-10-CM | POA: Diagnosis not present

## 2024-10-17 DIAGNOSIS — O99513 Diseases of the respiratory system complicating pregnancy, third trimester: Secondary | ICD-10-CM

## 2024-10-17 DIAGNOSIS — E669 Obesity, unspecified: Secondary | ICD-10-CM

## 2024-10-17 DIAGNOSIS — J45909 Unspecified asthma, uncomplicated: Secondary | ICD-10-CM

## 2024-10-17 NOTE — Progress Notes (Signed)
 Patient information  Patient Name: Alyssa [name]  Patient MRN:   [id number]  Referring practice: MFM Referring Provider: Belle SHIPPER  Problem List   Patient Active Problem List   Diagnosis Date Noted   Marginal insertion of umbilical cord affecting management of mother in third trimester 10/12/2024   Labor and delivery indication for care or intervention 10/12/2024   Anemia affecting pregnancy in third trimester 09/04/2024   Vulvovaginal candidiasis 08/24/2024   IUGR (intrauterine growth restriction) affecting care of mother, second trimester, fetus 2 07/07/2024   Obesity affecting pregnancy, antepartum 06/24/2024   Dichorionic diamniotic twin gestation 04/29/2024   Asthma, mild intermittent 01/27/2022   H/o pre-eclampsia 04/29/2019   Supervision of high-risk pregnancy 01/27/2019   Mood disorder 01/21/2016    Maternal Fetal medicine Consult  Alyssa [name] is a 28 y.o. G2P1001 at [redacted]w[redacted]d here for ultrasound and consultation. Alyssa [name] is doing well today with no acute concerns. Today we focused on the following:   D-di twins with FGR: Today I discussed the ultrasound report with the patient.  Twin B has sustained very little growth only about 50 g.  I discussed that this is consistent with very severe growth restriction.  The umbilical artery Dopplers are at the upper limit of normal.  Since the fetal growth is almost appeared to have stopped, delivery versus expectant management should be considered.  I discussed that delivery can occur between 34 and 35 weeks.  I recommend the patient receive a course of betamethasone  prior to this time.  She has a follow-up with her OB provider in 2 days.  I discussed this with the patient and she would prefer a 35-week delivery if possible.  As long as the antenatal testing is reassuring at her next appointment then she could receive steroids at 34 weeks and 5 days with delivery at [redacted] weeks  gestation.  She is aware of the risk of potential complications such as stillbirth and had a similar growth pattern in a previous pregnancy.  She will continue to watch her fetal movement which has been normal so far and go to the hospital with any concerns.  The patient had time to ask questions that were answered to her satisfaction.  She verbalized understanding and agrees to proceed with the plan below.  Recommendations -Continue twice weekly testing -Betamethazone course should be given in the 34th week of pregnancy with delivery between 34w+ and [redacted]w[redacted]d due to the minimal growth of twin B.  Patient will discuss this with her OB provider at her next visit.  I would favor betamethasone  to be given at that appointment and then the following day with delivery after the steroid window is complete.  I spent 40 minutes reviewing the patients chart, including labs and images as well as counseling the patient about her medical conditions. Greater than 50% of the time was spent in direct face-to-face patient counseling.  Alyssa [name]  MFM, [place]   10/17/2024  12:12 PM   Review of Systems: A review of systems was performed and was negative except per HPI   Vitals and Physical Exam    10/12/2024    3:00 PM 10/12/2024    2:54 PM 10/12/2024   11:28 AM  Vitals with BMI  Height  5' 2   Weight  193 lbs 193 lbs 11 oz  BMI  35.29 35.42  Systolic 109  115  Diastolic 60  78  Pulse 105  108    Sitting comfortably on  the sonogram table Nonlabored breathing Normal rate and rhythm Abdomen is nontender  Past pregnancies OB History  Gravida Para Term Preterm AB Living  2 1 1  0 0 1  SAB IAB Ectopic Multiple Live Births  0 0 0 0 1    # Outcome Date GA Lbr Len/2nd Weight Sex Type Anes PTL Lv  2 Current           1 Term 04/29/19 [redacted]w[redacted]d 12:10 / 00:05 2540 g M Vag-Spont None  LIV     Birth Comments: wnl     Future Appointments  Date Time Provider Department Center  10/19/2024 10:15 AM  [facility] US  1 [facility] None  10/19/2024 11:15 AM Charma Domino, CNM [facility] None  10/26/2024 10:15 AM [facility] NST [facility] None  10/26/2024 11:00 AM [facility] US[facility]  10/26/2024 11:15 AM [facility] US  1 [facility] None  10/26/2024  1:55 PM Dominic, [name], CNM [facility] None  11/03/2024 10:15 AM [facility] US  1 [facility] None  11/03/2024 11:15 AM [name], CNM [facility] None  11/07/2024  2:00 PM [facility]  11/07/2024  3:15 PM [facility] NST [facility] None  11/11/2024 10:15 AM [facility] US  1 [facility] None  11/11/2024 11:15 AM [name], CNM [facility] None

## 2024-10-17 NOTE — Procedures (Signed)
 Alyssa [name] January 03, 1996 [redacted]w[redacted]d  Fetus A Non-Stress Test Interpretation for 10/17/24  Indication: IUGR and Di/Di twin pregnancy  Fetal Heart Rate A Mode: External Baseline Rate (A): 130 bpm Variability: Moderate Accelerations: 15 x 15 Decelerations: None Multiple birth?: Yes  Uterine Activity Mode: Palpation Contraction Frequency (min): none noted Resting Tone Palpated: Relaxed  Interpretation (Fetal Testing) Nonstress Test Interpretation: Reactive Comments: Reviewed with Dr. William Jerrye Shin [name] 1996-07-22 [redacted]w[redacted]d   Fetus B Non-Stress Test Interpretation for 10/17/24  Indication: IUGR and Di/Di twin pregnancy  Fetal Heart Rate Fetus B Mode: External Baseline Rate (B): 130 BPM Variability: Moderate Accelerations: 15 x 15 Decelerations: None  Uterine Activity Mode: Palpation Contraction Frequency (min): none noted Resting Tone Palpated: Relaxed  Interpretation (Baby B - Fetal Testing) Nonstress Test Interpretation (Baby B): Reactive Comments (Baby B): Reviewed with Dr. William

## 2024-10-19 ENCOUNTER — Ambulatory Visit (INDEPENDENT_AMBULATORY_CARE_PROVIDER_SITE_OTHER)

## 2024-10-19 ENCOUNTER — Ambulatory Visit (INDEPENDENT_AMBULATORY_CARE_PROVIDER_SITE_OTHER): Admitting: Registered Nurse

## 2024-10-19 ENCOUNTER — Inpatient Hospital Stay: Admitting: Anesthesiology

## 2024-10-19 ENCOUNTER — Encounter: Payer: Self-pay | Admitting: Obstetrics and Gynecology

## 2024-10-19 ENCOUNTER — Encounter: Admission: AD | Disposition: A | Payer: Self-pay | Source: Ambulatory Visit | Attending: Registered Nurse

## 2024-10-19 ENCOUNTER — Other Ambulatory Visit: Payer: Self-pay

## 2024-10-19 ENCOUNTER — Inpatient Hospital Stay
Admission: AD | Admit: 2024-10-19 | Discharge: 2024-10-23 | DRG: 787 | Disposition: A | Discharge status: Home / Self Care | Source: Ambulatory Visit | Attending: Registered Nurse | Admitting: Registered Nurse

## 2024-10-19 VITALS — BP 119/74 | HR 93 | Wt 195.7 lb

## 2024-10-19 DIAGNOSIS — Z3A34 34 weeks gestation of pregnancy: Secondary | ICD-10-CM

## 2024-10-19 DIAGNOSIS — O30049 Twin pregnancy, dichorionic/diamniotic, unspecified trimester: Secondary | ICD-10-CM | POA: Diagnosis present

## 2024-10-19 DIAGNOSIS — D62 Acute posthemorrhagic anemia: Secondary | ICD-10-CM | POA: Diagnosis not present

## 2024-10-19 DIAGNOSIS — O30043 Twin pregnancy, dichorionic/diamniotic, third trimester: Secondary | ICD-10-CM

## 2024-10-19 DIAGNOSIS — O321XX0 Maternal care for breech presentation, not applicable or unspecified: Secondary | ICD-10-CM | POA: Diagnosis not present

## 2024-10-19 DIAGNOSIS — Z7982 Long term (current) use of aspirin: Secondary | ICD-10-CM

## 2024-10-19 DIAGNOSIS — Z91013 Allergy to seafood: Secondary | ICD-10-CM

## 2024-10-19 DIAGNOSIS — O43193 Other malformation of placenta, third trimester: Secondary | ICD-10-CM | POA: Diagnosis present

## 2024-10-19 DIAGNOSIS — O368132 Decreased fetal movements, third trimester, fetus 2: Secondary | ICD-10-CM | POA: Diagnosis present

## 2024-10-19 DIAGNOSIS — D259 Leiomyoma of uterus, unspecified: Secondary | ICD-10-CM | POA: Diagnosis present

## 2024-10-19 DIAGNOSIS — O321XX1 Maternal care for breech presentation, fetus 1: Secondary | ICD-10-CM | POA: Diagnosis present

## 2024-10-19 DIAGNOSIS — O99013 Anemia complicating pregnancy, third trimester: Secondary | ICD-10-CM | POA: Diagnosis present

## 2024-10-19 DIAGNOSIS — O365990 Maternal care for other known or suspected poor fetal growth, unspecified trimester, not applicable or unspecified: Secondary | ICD-10-CM | POA: Diagnosis present

## 2024-10-19 DIAGNOSIS — O0993 Supervision of high risk pregnancy, unspecified, third trimester: Secondary | ICD-10-CM

## 2024-10-19 DIAGNOSIS — O9081 Anemia of the puerperium: Secondary | ICD-10-CM | POA: Diagnosis not present

## 2024-10-19 DIAGNOSIS — O9952 Diseases of the respiratory system complicating childbirth: Secondary | ICD-10-CM

## 2024-10-19 DIAGNOSIS — D649 Anemia, unspecified: Secondary | ICD-10-CM

## 2024-10-19 DIAGNOSIS — O365931 Maternal care for other known or suspected poor fetal growth, third trimester, fetus 1: Secondary | ICD-10-CM

## 2024-10-19 DIAGNOSIS — O368331 Maternal care for abnormalities of the fetal heart rate or rhythm, third trimester, fetus 1: Secondary | ICD-10-CM

## 2024-10-19 DIAGNOSIS — O3413 Maternal care for benign tumor of corpus uteri, third trimester: Secondary | ICD-10-CM | POA: Diagnosis present

## 2024-10-19 DIAGNOSIS — Z87891 Personal history of nicotine dependence: Secondary | ICD-10-CM

## 2024-10-19 DIAGNOSIS — J4520 Mild intermittent asthma, uncomplicated: Secondary | ICD-10-CM

## 2024-10-19 DIAGNOSIS — O99214 Obesity complicating childbirth: Secondary | ICD-10-CM | POA: Diagnosis present

## 2024-10-19 DIAGNOSIS — O365922 Maternal care for other known or suspected poor fetal growth, second trimester, fetus 2: Secondary | ICD-10-CM

## 2024-10-19 DIAGNOSIS — F40298 Other specified phobia: Secondary | ICD-10-CM

## 2024-10-19 DIAGNOSIS — O365932 Maternal care for other known or suspected poor fetal growth, third trimester, fetus 2: Principal | ICD-10-CM | POA: Diagnosis present

## 2024-10-19 HISTORY — DX: Other specified phobia: F40.298

## 2024-10-19 LAB — CBC WITH DIFFERENTIAL/PLATELET
Abs Immature Granulocytes: 0.11 K/uL — ABNORMAL HIGH (ref 0.00–0.07)
Basophils Absolute: 0 K/uL (ref 0.0–0.1)
Basophils Relative: 0 %
Eosinophils Absolute: 0 K/uL (ref 0.0–0.5)
Eosinophils Relative: 0 %
HCT: 33.1 % — ABNORMAL LOW (ref 36.0–46.0)
Hemoglobin: 10.2 g/dL — ABNORMAL LOW (ref 12.0–15.0)
Immature Granulocytes: 1 %
Lymphocytes Relative: 10 %
Lymphs Abs: 1.3 K/uL (ref 0.7–4.0)
MCH: 26.4 pg (ref 26.0–34.0)
MCHC: 30.8 g/dL (ref 30.0–36.0)
MCV: 85.8 fL (ref 80.0–100.0)
Monocytes Absolute: 0.2 K/uL (ref 0.1–1.0)
Monocytes Relative: 2 %
Neutro Abs: 10.6 K/uL — ABNORMAL HIGH (ref 1.7–7.7)
Neutrophils Relative %: 87 %
Platelets: 257 K/uL (ref 150–400)
RBC: 3.86 MIL/uL — ABNORMAL LOW (ref 3.87–5.11)
RDW: 15.3 % (ref 11.5–15.5)
WBC: 12.3 K/uL — ABNORMAL HIGH (ref 4.0–10.5)
nRBC: 0 % (ref 0.0–0.2)

## 2024-10-19 LAB — TYPE AND SCREEN
ABO/RH(D): AB POS
Antibody Screen: NEGATIVE

## 2024-10-19 LAB — ABO/RH: ABO/RH(D): AB POS

## 2024-10-19 SURGERY — Surgical Case
Anesthesia: Spinal

## 2024-10-19 MED ORDER — MENTHOL 3 MG MT LOZG: 1.0000 | LOZENGE | OROMUCOSAL | Status: DC | PRN

## 2024-10-19 MED ORDER — ENOXAPARIN SODIUM 40 MG/0.4ML IJ SOSY
40.0000 mg | PREFILLED_SYRINGE | INTRAMUSCULAR | Status: DC
  Administered 2024-10-20 – 2024-10-22 (×2): 40 mg via SUBCUTANEOUS
  Filled 2024-10-19 (×4): qty 0.4

## 2024-10-19 MED ORDER — IBUPROFEN 600 MG PO TABS
600.0000 mg | ORAL_TABLET | Freq: Four times a day (QID) | ORAL | Status: DC
  Administered 2024-10-21 – 2024-10-23 (×10): 600 mg via ORAL
  Filled 2024-10-19 (×12): qty 1

## 2024-10-19 MED ORDER — ACETAMINOPHEN 500 MG PO TABS
1000.0000 mg | ORAL_TABLET | Freq: Four times a day (QID) | ORAL | Status: DC
  Administered 2024-10-20 – 2024-10-21 (×5): 1000 mg via ORAL
  Filled 2024-10-19 (×5): qty 2

## 2024-10-19 MED ORDER — SOD CITRATE-CITRIC ACID 500-334 MG/5ML PO SOLN
ORAL | Status: AC
  Administered 2024-10-19: 30 mL
  Filled 2024-10-19: qty 15

## 2024-10-19 MED ORDER — LACTATED RINGERS IV SOLN: INTRAVENOUS | Status: DC

## 2024-10-19 MED ORDER — DIPHENHYDRAMINE HCL 25 MG PO CAPS: 25.0000 mg | ORAL_CAPSULE | Freq: Four times a day (QID) | ORAL | Status: DC | PRN

## 2024-10-19 MED ORDER — KETOROLAC TROMETHAMINE 30 MG/ML IJ SOLN: 30.0000 mg | Freq: Four times a day (QID) | INTRAMUSCULAR | Status: AC | PRN

## 2024-10-19 MED ORDER — ONDANSETRON HCL 4 MG/2ML IJ SOLN: 4.0000 mg | Freq: Four times a day (QID) | INTRAMUSCULAR | Status: DC | PRN

## 2024-10-19 MED ORDER — PHENYLEPHRINE HCL-NACL 20-0.9 MG/250ML-% IV SOLN
INTRAVENOUS | Status: DC | PRN
Start: 2024-10-19 — End: 2024-10-19
  Administered 2024-10-19: 50 ug/min via INTRAVENOUS

## 2024-10-19 MED ORDER — KETOROLAC TROMETHAMINE 30 MG/ML IJ SOLN
INTRAMUSCULAR | Status: DC | PRN
  Administered 2024-10-19: 15 mg via INTRAVENOUS

## 2024-10-19 MED ORDER — PHENYLEPHRINE 80 MCG/ML (10ML) SYRINGE FOR IV PUSH (FOR BLOOD PRESSURE SUPPORT)
PREFILLED_SYRINGE | INTRAVENOUS | Status: DC | PRN
  Administered 2024-10-19 (×3): 160 ug via INTRAVENOUS

## 2024-10-19 MED ORDER — NALOXONE HCL 0.4 MG/ML IJ SOLN: 0.4000 mg | INTRAMUSCULAR | Status: DC | PRN

## 2024-10-19 MED ORDER — DIPHENHYDRAMINE HCL 25 MG PO CAPS: 25.0000 mg | ORAL_CAPSULE | ORAL | Status: DC | PRN

## 2024-10-19 MED ORDER — MORPHINE SULFATE (PF) 0.5 MG/ML IJ SOLN
INTRAMUSCULAR | Status: DC | PRN
  Administered 2024-10-19: .1 mg via INTRATHECAL

## 2024-10-19 MED ORDER — DEXAMETHASONE SOD PHOSPHATE PF 10 MG/ML IJ SOLN
INTRAMUSCULAR | Status: DC | PRN
  Administered 2024-10-19: 10 mg via INTRAVENOUS

## 2024-10-19 MED ORDER — COCONUT OIL OIL
1.0000 | TOPICAL_OIL | Status: DC | PRN
  Filled 2024-10-19: qty 7.5

## 2024-10-19 MED ORDER — SCOPOLAMINE 1 MG/3DAYS TD PT72: 1.0000 | MEDICATED_PATCH | Freq: Once | TRANSDERMAL | Status: DC

## 2024-10-19 MED ORDER — NALOXONE HCL 4 MG/10ML IJ SOLN: 1.0000 ug/kg/h | INTRAVENOUS | Status: DC | PRN

## 2024-10-19 MED ORDER — OXYCODONE HCL 5 MG PO TABS: 5.0000 mg | ORAL_TABLET | Freq: Four times a day (QID) | ORAL | Status: DC | PRN

## 2024-10-19 MED ORDER — SENNOSIDES-DOCUSATE SODIUM 8.6-50 MG PO TABS
2.0000 | ORAL_TABLET | Freq: Every day | ORAL | Status: DC
  Administered 2024-10-20 – 2024-10-22 (×3): 2 via ORAL
  Filled 2024-10-19 (×3): qty 2

## 2024-10-19 MED ORDER — PROPOFOL 10 MG/ML IV BOLUS
INTRAVENOUS | Status: AC
  Filled 2024-10-19: qty 20

## 2024-10-19 MED ORDER — LIDOCAINE HCL (PF) 1 % IJ SOLN
INTRAMUSCULAR | Status: DC | PRN
  Administered 2024-10-19: 3 mL via SUBCUTANEOUS

## 2024-10-19 MED ORDER — SIMETHICONE 80 MG PO CHEW
80.0000 mg | CHEWABLE_TABLET | Freq: Three times a day (TID) | ORAL | Status: DC
  Administered 2024-10-20 – 2024-10-22 (×4): 80 mg via ORAL
  Filled 2024-10-19 (×5): qty 1

## 2024-10-19 MED ORDER — KETOROLAC TROMETHAMINE 30 MG/ML IJ SOLN
INTRAMUSCULAR | Status: AC
  Filled 2024-10-19: qty 1

## 2024-10-19 MED ORDER — OXYTOCIN-SODIUM CHLORIDE 30-0.9 UT/500ML-% IV SOLN
INTRAVENOUS | Status: AC
  Administered 2024-10-19: 2.5 [IU]/h via INTRAVENOUS
  Filled 2024-10-19: qty 500

## 2024-10-19 MED ORDER — FENTANYL CITRATE (PF) 100 MCG/2ML IJ SOLN
INTRAMUSCULAR | Status: AC
  Filled 2024-10-19: qty 2

## 2024-10-19 MED ORDER — ACETAMINOPHEN 500 MG PO TABS: 1000.0000 mg | ORAL_TABLET | Freq: Four times a day (QID) | ORAL | Status: DC

## 2024-10-19 MED ORDER — BUPIVACAINE HCL 0.25 % IJ SOLN
INTRAMUSCULAR | Status: DC | PRN
  Administered 2024-10-19: 60 mL

## 2024-10-19 MED ORDER — ONDANSETRON HCL 4 MG/2ML IJ SOLN
INTRAMUSCULAR | Status: DC | PRN
  Administered 2024-10-19: 4 mg via INTRAVENOUS

## 2024-10-19 MED ORDER — PHENYLEPHRINE HCL-NACL 20-0.9 MG/250ML-% IV SOLN
INTRAVENOUS | Status: AC
  Filled 2024-10-19: qty 250

## 2024-10-19 MED ORDER — WITCH HAZEL-GLYCERIN EX PADS: 1.0000 | MEDICATED_PAD | CUTANEOUS | Status: DC | PRN

## 2024-10-19 MED ORDER — DIPHENHYDRAMINE HCL 50 MG/ML IJ SOLN: 12.5000 mg | INTRAMUSCULAR | Status: DC | PRN

## 2024-10-19 MED ORDER — BUPIVACAINE IN DEXTROSE 0.75-8.25 % IT SOLN
INTRATHECAL | Status: DC | PRN
Start: 2024-10-19 — End: 2024-10-19
  Administered 2024-10-19: 1.6 mL via INTRATHECAL

## 2024-10-19 MED ORDER — MORPHINE SULFATE (PF) 0.5 MG/ML IJ SOLN
INTRAMUSCULAR | Status: AC
  Filled 2024-10-19: qty 10

## 2024-10-19 MED ORDER — ACETAMINOPHEN 500 MG PO TABS
1000.0000 mg | ORAL_TABLET | Freq: Once | ORAL | Status: AC
  Administered 2024-10-19: 1000 mg via ORAL
  Filled 2024-10-19: qty 2

## 2024-10-19 MED ORDER — KETOROLAC TROMETHAMINE 30 MG/ML IJ SOLN: 30.0000 mg | Freq: Four times a day (QID) | INTRAMUSCULAR | Status: DC | PRN

## 2024-10-19 MED ORDER — MEPERIDINE HCL 25 MG/ML IJ SOLN: 6.2500 mg | INTRAMUSCULAR | Status: DC | PRN

## 2024-10-19 MED ORDER — ONDANSETRON HCL 4 MG/2ML IJ SOLN
INTRAMUSCULAR | Status: AC
  Filled 2024-10-19: qty 2

## 2024-10-19 MED ORDER — ZOLPIDEM TARTRATE 5 MG PO TABS: 5.0000 mg | ORAL_TABLET | Freq: Every evening | ORAL | Status: DC | PRN

## 2024-10-19 MED ORDER — GABAPENTIN 300 MG PO CAPS
300.0000 mg | ORAL_CAPSULE | Freq: Every day | ORAL | Status: DC
  Administered 2024-10-20 – 2024-10-22 (×3): 300 mg via ORAL
  Filled 2024-10-19 (×3): qty 1

## 2024-10-19 MED ORDER — BUPIVACAINE HCL (PF) 0.25 % IJ SOLN
INTRAMUSCULAR | Status: AC
  Filled 2024-10-19: qty 60

## 2024-10-19 MED ORDER — BETAMETHASONE SOD PHOS & ACET 6 (3-3) MG/ML IJ SUSP
12.0000 mg | Freq: Once | INTRAMUSCULAR | Status: AC
  Administered 2024-10-19: 12 mg via INTRAMUSCULAR
  Filled 2024-10-19: qty 5

## 2024-10-19 MED ORDER — SIMETHICONE 80 MG PO CHEW
80.0000 mg | CHEWABLE_TABLET | ORAL | Status: DC | PRN
  Administered 2024-10-22: 80 mg via ORAL
  Filled 2024-10-19: qty 1

## 2024-10-19 MED ORDER — OXYCODONE HCL 5 MG PO TABS
5.0000 mg | ORAL_TABLET | ORAL | Status: DC | PRN
  Administered 2024-10-20: 10 mg via ORAL
  Administered 2024-10-20 – 2024-10-21 (×2): 5 mg via ORAL
  Administered 2024-10-22 (×3): 10 mg via ORAL
  Filled 2024-10-19: qty 1
  Filled 2024-10-19 (×4): qty 2
  Filled 2024-10-19 (×2): qty 1

## 2024-10-19 MED ORDER — OXYTOCIN BOLUS FROM INFUSION: 333.0000 mL | Freq: Once | INTRAVENOUS | Status: DC

## 2024-10-19 MED ORDER — ACETAMINOPHEN 325 MG PO TABS: 650.0000 mg | ORAL_TABLET | ORAL | Status: DC | PRN

## 2024-10-19 MED ORDER — LACTATED RINGERS IV SOLN: 500.0000 mL | INTRAVENOUS | Status: DC | PRN

## 2024-10-19 MED ORDER — SODIUM CHLORIDE 0.9% FLUSH: 3.0000 mL | INTRAVENOUS | Status: DC | PRN

## 2024-10-19 MED ORDER — FENTANYL CITRATE (PF) 100 MCG/2ML IJ SOLN
INTRAMUSCULAR | Status: DC | PRN
  Administered 2024-10-19: 15 ug via INTRATHECAL

## 2024-10-19 MED ORDER — DIBUCAINE (PERIANAL) 1 % EX OINT: 1.0000 | TOPICAL_OINTMENT | CUTANEOUS | Status: DC | PRN

## 2024-10-19 MED ORDER — OXYTOCIN-SODIUM CHLORIDE 30-0.9 UT/500ML-% IV SOLN
INTRAVENOUS | Status: DC | PRN
  Administered 2024-10-19: 30 [IU] via INTRAVENOUS

## 2024-10-19 MED ORDER — GABAPENTIN 300 MG PO CAPS
300.0000 mg | ORAL_CAPSULE | Freq: Once | ORAL | Status: AC
  Administered 2024-10-19: 300 mg via ORAL
  Filled 2024-10-19: qty 1

## 2024-10-19 MED ORDER — PRENATAL MULTIVITAMIN CH
1.0000 | ORAL_TABLET | Freq: Every day | ORAL | Status: DC
  Administered 2024-10-20 – 2024-10-22 (×3): 1 via ORAL
  Filled 2024-10-19 (×3): qty 1

## 2024-10-19 MED ORDER — CEFAZOLIN SODIUM-DEXTROSE 2-4 GM/100ML-% IV SOLN
2.0000 g | Freq: Once | INTRAVENOUS | Status: AC
  Administered 2024-10-19: 2 g via INTRAVENOUS
  Filled 2024-10-19: qty 100

## 2024-10-19 MED ORDER — OXYTOCIN-SODIUM CHLORIDE 30-0.9 UT/500ML-% IV SOLN
2.5000 [IU]/h | INTRAVENOUS | Status: DC
  Filled 2024-10-19: qty 500

## 2024-10-19 MED ORDER — KETOROLAC TROMETHAMINE 30 MG/ML IJ SOLN
30.0000 mg | Freq: Four times a day (QID) | INTRAMUSCULAR | Status: AC
  Administered 2024-10-20 (×3): 30 mg via INTRAVENOUS
  Filled 2024-10-19 (×3): qty 1

## 2024-10-19 MED ORDER — MORPHINE SULFATE (PF) 2 MG/ML IV SOLN: 1.0000 mg | INTRAVENOUS | Status: DC | PRN

## 2024-10-19 MED ORDER — OXYTOCIN-SODIUM CHLORIDE 30-0.9 UT/500ML-% IV SOLN: 2.5000 [IU]/h | INTRAVENOUS | Status: AC

## 2024-10-19 MED ORDER — ONDANSETRON HCL 4 MG/2ML IJ SOLN: 4.0000 mg | Freq: Three times a day (TID) | INTRAMUSCULAR | Status: DC | PRN

## 2024-10-19 SURGICAL SUPPLY — 31 items
BENZOIN TINCTURE PRP APPL 2/3 (GAUZE/BANDAGES/DRESSINGS) IMPLANT
CHLORAPREP W/TINT 26 (MISCELLANEOUS) ×1 IMPLANT
DRSG OPSITE POSTOP 4X10 (GAUZE/BANDAGES/DRESSINGS) IMPLANT
DRSG TELFA 3X8 NADH STRL (GAUZE/BANDAGES/DRESSINGS) ×1 IMPLANT
ELECT CAUTERY BLADE 6.4 (BLADE) ×1 IMPLANT
ELECTRODE REM PT RTRN 9FT ADLT (ELECTROSURGICAL) ×1 IMPLANT
GAUZE SPONGE 4X4 12PLY STRL (GAUZE/BANDAGES/DRESSINGS) ×1 IMPLANT
GLOVE SURG SYN 8.0 PF PI (GLOVE) ×1 IMPLANT
GOWN STRL REUS W/ TWL LRG LVL3 (GOWN DISPOSABLE) ×2 IMPLANT
GOWN STRL REUS W/ TWL XL LVL3 (GOWN DISPOSABLE) ×1 IMPLANT
MANIFOLD NEPTUNE II (INSTRUMENTS) ×1 IMPLANT
MAT PREVALON FULL STRYKER (MISCELLANEOUS) ×1 IMPLANT
NDL HYPO 22X1.5 SAFETY MO (MISCELLANEOUS) ×1 IMPLANT
PACK C SECTION AR (MISCELLANEOUS) ×1 IMPLANT
PAD OB MATERNITY 11 LF (PERSONAL CARE ITEMS) ×1 IMPLANT
PAD PREP OB/GYN DISP 24X41 (PERSONAL CARE ITEMS) ×1 IMPLANT
SCRUB CHG 4% DYNA-HEX 4OZ (MISCELLANEOUS) ×1 IMPLANT
SOLN 0.9% NACL POUR BTL 1000ML (IV SOLUTION) ×1 IMPLANT
SOLN STERILE WATER 500 ML (IV SOLUTION) ×1 IMPLANT
STAPLER INSORB 30 2030 C-SECTI (MISCELLANEOUS) IMPLANT
STRAP SAFETY 5IN WIDE (MISCELLANEOUS) ×1 IMPLANT
STRIP CLOSURE SKIN 1/2X4 (GAUZE/BANDAGES/DRESSINGS) IMPLANT
SUCT VACUUM KIWI BELL (SUCTIONS) IMPLANT
SUT CHROMIC 1 CTX 36 (SUTURE) ×3 IMPLANT
SUT PLAIN GUT 0 (SUTURE) ×2 IMPLANT
SUT VIC AB 0 CT1 36 (SUTURE) ×2 IMPLANT
SUT VIC AB 2-0 SH 27XBRD (SUTURE) IMPLANT
SYR 30ML LL (SYRINGE) ×2 IMPLANT
TAPE CLOTH SURG 4X10 WHT LF (GAUZE/BANDAGES/DRESSINGS) IMPLANT
TAPE PAPER 3X10 WHT MICROPORE (GAUZE/BANDAGES/DRESSINGS) IMPLANT
TRAP FLUID SMOKE EVACUATOR (MISCELLANEOUS) ×1 IMPLANT

## 2024-10-19 NOTE — Progress Notes (Signed)
 Call back from Dr Arna. He is in agreement with plan to proceed to delivery this evening based on fetal growth restriction and BPP earlier today.   Slater Rains, CNM

## 2024-10-19 NOTE — Assessment & Plan Note (Signed)
 BPP Baby B is 4/8 today. This is the twin with significant FGR.  Will send to labor and delivery for further monitoring of twins and development of POC.  Will defer clinic administration of BTMS and have done at the hospital.
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
## 2024-10-19 NOTE — Consult Note (Signed)
 [facility]  Prenatal Consult      10/19/2024   4:21 PM  I was asked by Slater Pattee, CNM to consult on this patient for possible preterm delivery. I had the pleasure of meeting with Alyssa [name] today.   She is a 28 yo G2P1001 female presenting at [redacted]w[redacted]d di/di twin IUP with concerns for FGR and BPP 4/8 of twin B. Pregnancy has also been complicated by marginal cord insertion and obesity. She is has received BMZ x1 (last dose today 1542). Most recent estimated fetal weight of 2020g (Twin A) and 1568g (Twin B) with 22% discordance.   We discussed the possible needs infants born at this gestation, with overall very high likelihood of good outcomes. I explained that the neonatal intensive care team would be present for the delivery and outlined the likely delivery room course including routine resuscitation and NRP-guided approaches to the treatment of respiratory distress. We discussed the potential need for CPAP for respiratory distress, much less likely mechanical ventilation and surfactant administration, and feeding/nutritional support with gavage enteral feedings and/or IV fluids, temperature support, and monitoring.   We discussed the importance of good nutrition and various methods of providing nutrition (parenteral hyperalimentation, gavage feedings and/or oral feeding). We discussed the benefits of human milk. I encouraged breast feeding and pumping soon after birth and outlined resources that are available to support breast feeding. We discussed the possibility of using donor breast milk as a bridge.  We discussed the average length of stay but I noted that the actual LOS would depend on the severity of problems encountered and response to treatments. We discussed visitation policies and the resources available while her children is in the hospital.  She expressed understanding and agreement with the plan for resuscitation and intensive care. All  questions were answered.  Thank you for involving us  in the care of this patient. A member of our team will be available should the family have additional questions.   Alyce Kirsch, MD Neonatal Medicine  I spent ~40 minutes in consultation time, of which 25 minutes was spent in direct face to face counseling.

## 2024-10-19 NOTE — Anesthesia Procedure Notes (Signed)
 Spinal  Patient location during procedure: OR Reason for block: surgical anesthesia Staffing Performed: anesthesiologist and resident/CRNA  Anesthesiologist: Vicci Camellia Glatter, MD Resident/CRNA: Delores Evalene BROCKS, CRNA Performed by: Delores Evalene BROCKS, CRNA Authorized by: Vicci Camellia Glatter, MD   Preanesthetic Checklist Completed: patient identified, IV checked, site marked, risks and benefits discussed, surgical consent, monitors and equipment checked, pre-op evaluation and timeout performed Spinal Block Patient position: sitting Prep: Betadine Patient monitoring: heart rate, continuous pulse ox, blood pressure and cardiac monitor Approach: midline Location: L4-5 Injection technique: single-shot Needle Needle type: Whitacre and Introducer  Needle gauge: 24 G Needle length: 9 cm Assessment Events: CSF return Additional Notes Negative paresthesia. Negative blood return. Positive free-flowing CSF. Expiration date of kit checked and confirmed. Patient tolerated procedure well, without complications.

## 2024-10-19 NOTE — Discharge Summary (Signed)
 OB Discharge Summary     Patient Name: Alyssa [name] DOB: 03/24/1996 MRN: [id number]  Date of admission: 10/19/2024 Delivering MD: Debby Dinsmore Date of Delivery: 10/19/2024  Date of discharge: 10/23/2024  Admitting diagnosis: Pregnancy affected by fetal growth restriction [O36.5990] Intrauterine pregnancy: [redacted]w[redacted]d     Secondary diagnosis: DiDi twin gestation, anemia     Discharge diagnosis: Preterm Pregnancy Delivered, cesarean delivery                                                                                              Post partum procedures:Iron  infusion  Augmentation: N/A  Complications: None  Hospital course:  Sceduled C/S   28 y.o. yo G2P1101 at [redacted]w[redacted]d was admitted to the hospital 10/19/2024 for scheduled cesarean section with the following indication:severe growth restriction baby B, breech presentation baby A.Delivery details are as follows:   Membrane Rupture Time/Date:    [name], BoyA Nakeesha [[id number]]      [name], GirlB Nashika [[id number]]   ,   [name], BoyA Alijah [[id number]]      Aynsley, Fleet [[id number]]     Delivery Method:   [name], BoyA Mikyla [[id number]]  C-Section, Low Transverse    [name], GirlB Ahley [[id number]]  C-Section, Low Transverse Operative Delivery  Details of operation can be found in separate operative note.  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 She received an iron  infusion on POD 1. She declined a blood transfusion on POD 3.  She is ambulating, tolerating a regular diet, passing flatus, and urinating well.   Patient is discharged home in stable condition on  10/23/24        Newborn Data: Birth date:   Tulani, Kidney [[id number]]  10/19/2024    [name], GirlB Angelia [[id number]]  10/19/2024 Birth time:   Sallye, Lunz  [[id number]]  10:14 PM    Pang, Robers [[id number]]  10:16 PM Gender:   Rilda, Bulls [[id number]]  Female    Malin, Sambrano [[id number]]  Female Living status:   [name], BoyA Joslynne [[id number]]  Living    [name], JAIDA BASURTO [[id number]]  Living Apgars:   Adessa, Primiano [[id number]]  7    [name], ERRYN DICKISON [[id number]]  8 ,   Nohealani, Medinger [[id number]]  9    Hardrick-ChambersCarmellia, Kreisler [[id number]]  8  Weight:   Kyler, Lerette [[id number]]  2030 g    Rickayla, Wieland [[id number]]  1500 g    Physical exam  Vitals:   10/22/24 0900 10/22/24 1520 10/22/24 2330 10/23/24 0930  BP: 95/60 114/71 (!) 101/59 125/80  Pulse: 70 71 87 89  Resp: 18 18 16 18   Temp: 98 F (36.7 C) 98.6 F (37 C) 98.3 F (36.8 C) 97.8 F (36.6 C)  TempSrc: Oral Oral Oral Oral  SpO2: 98% 99% 99% 97%  Weight:      Height:       General: alert, cooperative, and no distress Lochia: appropriate Uterine Fundus: firm Incision: Dressing is clean, dry, and intact DVT Evaluation: No evidence of DVT seen on physical exam.  Labs: Lab Results  Component Value Date   WBC 23.6 (H) 10/22/2024   HGB 6.9 (L) 10/22/2024   HCT 21.6 (L) 10/22/2024   MCV 83.4 10/22/2024   PLT 263 10/22/2024    Discharge instruction: per After Visit Summary.  Medications:  Allergies as of 10/23/2024       Reactions   Shellfish Allergy Hives   Throat closes   Banana Itching, Swelling   Peach Flavoring Agent (non-screening) Itching        Medication List     STOP taking these medications    aspirin  EC 81 MG tablet       TAKE these medications    ACCRUFeR  30 MG Caps Generic drug: Ferric Maltol  Take 1 capsule (30 mg total) by mouth 2 (two) times daily between meals. Take one hour before a meal or 2 hours after.   acetaminophen  500 MG  tablet Commonly known as: TYLENOL  Take 2 tablets (1,000 mg total) by mouth every 6 (six) hours.   albuterol  (2.5 MG/3ML) 0.083% nebulizer solution Commonly known as: PROVENTIL  Take 3 mLs (2.5 mg total) by nebulization once as needed for wheezing or shortness of breath (for moderate/severe hypersensitivity reactions).   dibucaine 1 % Oint Commonly known as: NUPERCAINAL Place 1 Application rectally as needed for hemorrhoids.   gabapentin  300 MG capsule Commonly known as: NEURONTIN  Take 1 capsule (300 mg total) by mouth at bedtime.   ibuprofen  600 MG tablet Commonly known as: ADVIL  Take 1 tablet (600 mg total) by mouth every 6 (six) hours.   multivitamin-prenatal 27-0.8 MG Tabs tablet Take 1 tablet by mouth daily at 12 noon.   senna-docusate 8.6-50 MG tablet Commonly known as: Senokot-S Take 2 tablets by mouth 2 (two) times daily as needed for mild constipation.   simethicone  80 MG chewable tablet Commonly known as: MYLICON Chew 1 tablet (80 mg total) by mouth 3 (three) times daily after meals.   witch hazel-glycerin  pad Commonly known as: TUCKS Apply 1 Application topically as needed for hemorrhoids.               Discharge Care Instructions  (From admission, onward)           Start     Ordered   10/23/24 0000  Leave dressing on - Keep it clean, dry, and intact until clinic visit        10/23/24 1121            Diet: routine diet  Activity: Advance as tolerated. Pelvic rest for 6 weeks.   Outpatient follow up:  Follow-up Information     Schermerhorn, Debby PARAS, MD. Schedule an appointment as soon as possible for a visit in 2 week(s).   Specialty: Obstetrics and Gynecology Why: 2 week postpartum mood and incision check at [facility] 6 week postpartum follow up at [facility] information: [address] [place] [address] [phone]                   Postpartum contraception:  Undecided Rhogam Given postpartum: Rh positive Rubella vaccine given postpartum: immune Varicella vaccine given postpartum: immune TDaP given antepartum or postpartum: Declined   Baby Feeding: Bottle of pumped milk  Disposition:NICU  SIGNED:  Lauraine Lakes, CNM 10/23/2024 11:21 AM

## 2024-10-19 NOTE — Progress Notes (Signed)
    Return Prenatal Note   Subjective   28 y.o. G2P1001 at [redacted]w[redacted]d presents for this follow-up prenatal visit.  Patient is being followed for a di/di twin pregnancy complicated by significant FGR in Twin B. She saw MFM 2 days ago who recommended administration of antenatal steroids and delivery 34-35 weeks. Patient is feeling fetal movement from only one baby.  Patient reports: Movement: Present (Only from 1 baby) Contractions: Irregular  Objective   Flow sheet Vitals: Pulse Rate: 93 BP: 119/74 Total weight gain: 15 lb 11.2 oz (7.121 kg)  General Appearance  No acute distress, well appearing, and well nourished Pulmonary   Normal work of breathing Neurologic   Alert and oriented to person, place, and time Psychiatric   Mood and affect within normal limits  Baby A BPP 8/8 Baby B BPP 4/8 (Tone and movement)  Assessment/Plan   Plan  28 y.o. G2P1001 at [redacted]w[redacted]d presents for follow-up OB visit. Reviewed prenatal record including previous visit note.  Dichorionic diamniotic twin gestation BPP 7 B is 4/8 today. This is the twin with significant FGR.  Will send to labor and delivery for further monitoring of twins and development of POC.  Will defer clinic administration of BTMS and have done at the hospital.  Discussed likely delivery by Friday, at the latest, when babies would be steroid mature. May need to be delivered sooner depending on fetal monitoring and OB physician discretion.  Spoke with Slater Rains, CNM who is aware that patient will be coming to labor and delivery for monitoring and steroids, development of POC.    Lauraine Lakes, CNM  10/19/24 11:41 AM

## 2024-10-19 NOTE — Op Note (Signed)
 NAME: Alyssa [name], Alyssa [name] MEDICAL RECORD NO: [id number] ACCOUNT NO: [id number] DATE OF BIRTH: 03/24/1996 FACILITY: [facility] LOCATION: [facility] PHYSICIAN: Debby DOROTHA Dinsmore, MD  Operative Report   PREOPERATIVE DIAGNOSES: 1.  34 plus 1 weeks estimated gestational age. 2.  Diamniotic dichorionic twin gestation. 3.  Severe growth restriction baby B. 4.  Breech presentation baby A.  POSTOPERATIVE DIAGNOSES: 1.  34 plus 1 weeks estimated gestational age. 2.  Diamniotic dichorionic twin gestation. 3.  Severe growth restriction baby B. 4.  Breech presentation baby A. 5.  Vigorous baby A and baby B delivered via breech extraction.  PROCEDURE:  Primary low transverse cesarean section with breech extraction of baby A and baby B.  SURGEON:  Debby DOROTHA Dinsmore, MD.  FIRST ASSISTANT:  Slater Rains, certified nurse midwife.  ANESTHESIA:  Spinal.  INDICATIONS:  The patient is a 28 year old gravida 2, para 1 with a di-di twin gestation who has been followed by maternal fetal medicine, on the day of the delivery, severe growth restriction diagnosed with baby B less than 1 percentile with minimal  growth from prior ultrasound.  Biophysical profile for baby B was 4 out of 8.  The patient was brought to labor and delivery and baby B demonstrated an isolated deceleration.  Given low placental reserve for baby B, it was elected to proceed on with  primary cesarean section given baby A was in the breech presentation.  DESCRIPTION OF PROCEDURE:  After adequate spinal anesthesia, the patient was placed in dorsal supine position with a hip rolled on the right side.  The patient's abdomen and vagina were prepped and draped in normal sterile fashion.  The patient did  receive 2 g IV Ancef  for surgical prophylaxis.  Time out was performed.  A Pfannenstiel incision was made 2 fingerbreadths above the symphysis pubis.  Sharp dissection was used to identify the fascia.  The fascia was opened in the  midline and opened in a  transverse fashion.  The superior aspect of the fascia was grasped with Kocher clamps and the recti muscles were dissected free.  The inferior aspect of the fascia was grasped with Kocher clamps and the pyramidalis muscle was dissected free.  An entry  into the peritoneal cavity was accomplished sharply.  The vesicouterine peritoneal fold was identified and opened and the bladder flap was reflected inferiorly.  A low transverse uterine incision was made.  Upon entry into the endometrial cavity, fetal  amniotic sac A was opened and buttocks followed by delivery of fetal legs, followed by delivery of the arms in a medial sweeping maneuver and Mauriceau-Smellie-Veit maneuver for delivery of the head.  Vigorous female was dried quickly and cord was doubly  clamped and vigorous female was passed to nursery staff who assigned Apgar scores of 7 and 9, delivery time of 2214.  Amniotic sac on baby B was identified, opened, and fetal legs were identified and brought to the incision.  The lower abdomen was draped  with a wound towel and the arms were delivered with a medial sweeping maneuver and again a Mauriceau-Smellie-Veit maneuver for delivery of the head.  Delayed cord clamping of baby B who was vigorous during this time.  After 60 seconds, the cord was  doubly clamped and passed to nursery staff who assigned Apgar scores of 8 and 8.  The placentas were then delivered.  Umbilical cord A with 1 clamp, umbilical cord B with 2 clamps, placentas will be sent to pathology.  The uterus was exteriorized and the  endometrial cavity was wiped clean with laparotomy tape.  The uterine incision was closed with 1 chromic suture in a running locking fashion.  One additional figure-of-eight suture was required for hemostasis.  Fallopian tubes and ovaries appeared  normal.  There was a 2 x 1 cm pedunculated fibroid on the anterior uterus that was left in situ.  The posterior cul-de-sac was then irrigated and  suctioned and the uterus was placed back into the abdominal cavity.  Paracolic gutters were wiped clean with  laparotomy tape.  The uterine incision again appeared hemostatic.  Of note, certified nurse midwife Gledhill's expertise for retraction and aided delivery of the babies was required for this procedure.  The fascia was then closed with 0 Vicryl suture in  a running non-locking fashion, 2 separate sutures used.  Subcutaneous tissues were irrigated and Bovie for hemostasis.  30 mL of 0.25% Marcaine  was then injected at the fascial edges and the skin was reapproximated with Insorb absorbable staples.  An  additional 30 mL of Marcaine  solution was injected beneath the skin.  Good hemostasis, good cosmetic results noted.  INTRAOPERATIVE FLUIDS:  900 mL.  URINE OUTPUT:  100 mL.  QUANTITATIVE BLOOD LOSS:  790 mL.  DISPOSITION:  The patient tolerated the procedure well and was taken to the recovery room in good condition.   NIK D: 10/19/2024 11:00:11 pm T: 10/19/2024 11:24:00 pm  JOB: [id number]/ [id number]

## 2024-10-19 NOTE — Transfer of Care (Signed)
 Immediate Anesthesia Transfer of Care Note  Patient: Alyssa [name]  Procedure(s) Performed: CESAREAN DELIVERY  Patient Location: PACU and Mother/Baby  Anesthesia Type:Spinal  Level of Consciousness: awake and alert   Airway & Oxygen Therapy: Patient Spontanous Breathing  Post-op Assessment: Report given to RN and Post -op Vital signs reviewed and stable  Post vital signs: Reviewed and stable  Last Vitals:  Vitals Value Taken Time  BP 81/48 10/19/24 23:02  Temp    Pulse 73 10/19/24 23:04  Resp 19 10/19/24 23:04  SpO2 99 % 10/19/24 23:04  Vitals shown include unfiled device data.  Last Pain:  Vitals:   10/19/24 1942  PainSc: 0-No pain         Complications: No notable events documented.

## 2024-10-19 NOTE — Brief Op Note (Signed)
 10/19/2024  10:44 PM  PATIENT:  Alyssa [name]  28 y.o. female  PRE-OPERATIVE DIAGNOSIS: 34+1 weeks di-di twins and severe IUGR baby b, BAby A breech   POST-OPERATIVE DIAGNOSIS: same as above . Vigorous female (a) and female ( B) delivered   PROCEDURE:  Procedure(s): CESAREAN DELIVERY (N/A) Primary LTCS with breech extraction A+B  SURGEON:  Surgeons and Role:    * Jayne Peckenpaugh, Debby PARAS, MD - Primary  PHYSICIAN ASSISTANT: CNM Slater Rains  ASSISTANTS: none   ANESTHESIA:   spinal  EBL:  QBL 790  IOF 900 cc ou 100cc  BLOOD ADMINISTERED:none  DRAINS: Urinary Catheter (Foley)   LOCAL MEDICATIONS USED:  MARCAINE      SPECIMEN:  Source of Specimen:  placentas  DISPOSITION OF SPECIMEN:  PATHOLOGY  COUNTS:  YES  TOURNIQUET:  * No tourniquets in log *  DICTATION: .Other Dictation: Dictation Number verbal   PLAN OF CARE: Admit to inpatient   PATIENT DISPOSITION:  PACU - hemodynamically stable.   Delay start of Pharmacological VTE agent (>24hrs) due to surgical blood loss or risk of bleeding: not applicable

## 2024-10-19 NOTE — Progress Notes (Signed)
 Patient ID: Alyssa [name], female   DOB: 04-21-1996, 28 y.o.   MRN: [id number] Chart reviewed from MFM 10/17/24 through today .CNM Gledhill updated me as well.   Significant IUGR < 1% baby B with BPP 4/8today .  At 1505 looks like Baby B had a decel to 120's .  A; Severe IUGR with non reassuring BPP today  Presumed limited reserve  P: My recommendation is for delivery tonight in a controlled setting  Breech/ transverse mandates Primary LTCS .  Counseled the pt for indication for c/s . She agrees . The risks of cesarean section discussed with the patient included but were not limited to: bleeding which may require transfusion or reoperation; infection which may require antibiotics; injury to bowel, bladder, ureters or other surrounding organs; injury to the fetus; need for additional procedures including hysterectomy in the event of a life-threatening hemorrhage; placental abnormalities wth subsequent pregnancies, incisional problems, thromboembolic phenomenon and other postoperative/anesthesia complications. The patient concurred with the proposed plan, giving informed written consent for the procedure.   Patient has been NPO since 1430 she will remain NPO for procedure. Anesthesia and OR aware. Preoperative prophylactic antibiotics and SCDs ordered on call to the OR.  To OR when ready.
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
## 2024-10-20 ENCOUNTER — Encounter: Payer: Self-pay | Admitting: Obstetrics and Gynecology

## 2024-10-20 LAB — CBC
HCT: 25.8 % — ABNORMAL LOW (ref 36.0–46.0)
Hemoglobin: 8.3 g/dL — ABNORMAL LOW (ref 12.0–15.0)
MCH: 26.9 pg (ref 26.0–34.0)
MCHC: 32.2 g/dL (ref 30.0–36.0)
MCV: 83.8 fL (ref 80.0–100.0)
Platelets: 223 K/uL (ref 150–400)
RBC: 3.08 MIL/uL — ABNORMAL LOW (ref 3.87–5.11)
RDW: 15 % (ref 11.5–15.5)
WBC: 20.6 K/uL — ABNORMAL HIGH (ref 4.0–10.5)
nRBC: 0 % (ref 0.0–0.2)

## 2024-10-20 LAB — SYPHILIS: RPR W/REFLEX TO RPR TITER AND TREPONEMAL ANTIBODIES, TRADITIONAL SCREENING AND DIAGNOSIS ALGORITHM: RPR Ser Ql: NONREACTIVE

## 2024-10-20 MED ORDER — DIPHENHYDRAMINE HCL 50 MG/ML IJ SOLN
INTRAMUSCULAR | Status: AC
  Administered 2024-10-20: 12.5 mg via INTRAVENOUS
  Filled 2024-10-20: qty 1

## 2024-10-20 MED ORDER — LACTATED RINGERS IV BOLUS
500.0000 mL | Freq: Once | INTRAVENOUS | Status: AC
  Administered 2024-10-20: 1000 mL via INTRAVENOUS

## 2024-10-20 MED ORDER — IBUPROFEN 600 MG PO TABS
ORAL_TABLET | ORAL | Status: AC
  Administered 2024-10-20: 600 mg
  Filled 2024-10-20: qty 1

## 2024-10-20 MED ORDER — IRON SUCROSE 500 MG IVPB - SIMPLE MED
500.0000 mg | Freq: Once | INTRAVENOUS | Status: AC
  Administered 2024-10-20: 500 mg via INTRAVENOUS
  Filled 2024-10-20: qty 500

## 2024-10-20 MED ORDER — ALBUTEROL SULFATE (2.5 MG/3ML) 0.083% IN NEBU: 2.5000 mg | INHALATION_SOLUTION | Freq: Once | RESPIRATORY_TRACT | Status: DC | PRN

## 2024-10-20 MED ORDER — DIPHENHYDRAMINE HCL 50 MG/ML IJ SOLN: 25.0000 mg | Freq: Once | INTRAMUSCULAR | Status: DC | PRN

## 2024-10-20 MED ORDER — METHYLPREDNISOLONE SODIUM SUCC 125 MG IJ SOLR: 125.0000 mg | Freq: Once | INTRAMUSCULAR | Status: DC | PRN

## 2024-10-20 MED ORDER — SODIUM CHLORIDE 0.9 % IV SOLN: INTRAVENOUS | Status: AC | PRN

## 2024-10-20 MED ORDER — EPINEPHRINE 0.3 MG/0.3ML IJ SOAJ: 0.3000 mg | Freq: Once | INTRAMUSCULAR | Status: DC | PRN

## 2024-10-20 MED ORDER — SODIUM CHLORIDE 0.9 % IV BOLUS: 500.0000 mL | Freq: Once | INTRAVENOUS | Status: DC | PRN
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
## 2024-10-20 NOTE — Anesthesia Postprocedure Evaluation (Signed)
 Anesthesia Post Note  Patient: Alyssa [name]  Procedure(s) Performed: CESAREAN DELIVERY  Patient location during evaluation: Mother Baby Anesthesia Type: Spinal Level of consciousness: awake and alert Pain management: pain level controlled Vital Signs Assessment: post-procedure vital signs reviewed and stable Respiratory status: spontaneous breathing, nonlabored ventilation and respiratory function stable Cardiovascular status: stable Postop Assessment: no headache, no backache and epidural receding Anesthetic complications: no   No notable events documented.   Last Vitals:  Vitals:   10/20/24 0757 10/20/24 1153  BP: (!) 99/59 (!) 105/58  Pulse:  77  Resp: 18 16  Temp: 36.4 C 36.4 C  SpO2:  96%    Last Pain:  Vitals:   10/20/24 1153  TempSrc: Oral  PainSc:                  Debby Mines

## 2024-10-20 NOTE — Progress Notes (Signed)
 Progress Note - Cesarean Delivery  Alyssa [name] is a 28 y.o. G2P1101 now PP day 1 s/p    Shron, Ozer [[id number]]  C-Section, Low Transverse    Alitzel, Cookson [[id number]]  C-Section, Low Transverse.   Subjective:  Patient reports no problems with eating, passing gas, voiding, or their wound    Objective:  Vital signs in last 24 hours: Temp:  [97.5 F (36.4 C)-97.9 F (36.6 C)] 97.6 F (36.4 C) (11/27 0757) Pulse Rate:  [57-102] 67 (11/27 0500) Resp:  [10-18] 18 (11/27 0757) BP: (81-120)/(39-77) 99/59 (11/27 0757) SpO2:  [95 %-100 %] 99 % (11/27 0500) Weight:  [88.5 kg-88.8 kg] 88.5 kg (11/26 1501)  Physical Exam:  General: alert, cooperative, appears stated age, fatigued, and no distress Lochia: appropriate Uterine Fundus: firm @ U Incision: no significant drainage, dressing dry and intact    Data Review Recent Labs    10/19/24 2031 10/20/24 0510  HGB 10.2* 8.3*  HCT 33.1* 25.8*    Assessment:  Active Problems:   Dichorionic diamniotic twin gestation     Overview: Saw MFM yesterday and had normal fluid, AFI, and dopplers for BOTH fetuses      (previously elevated dopplers).     Scheduled w/ MFM on 11/12 for repeat growth and dopplers.     Will try to get NSTs here on Mondays since she's being seen on Wednesdays      with MFM and the plan is 2x/ wk ANT for now.   Anemia affecting pregnancy in third trimester     Overview: Last Hgb was 9.5 on 10/10. Was prescribed accrufer  at that time.   Marginal insertion of umbilical cord affecting management of mother in third trimester   Pregnancy affected by fetal growth restriction   Postpartum care following cesarean delivery   Cesarean delivery delivered   Encounter for care or examination of lactating mother   Status post Cesarean section. Doing well postoperatively.     Plan:       Continue current care.  Iron  infusion  Alyssa [name], CNM   10/20/2024 9:25 AM

## 2024-10-20 NOTE — Anesthesia Post-op Follow-up Note (Signed)
  Anesthesia Pain Follow-up Note  Patient: Alyssa [name]  Day #: 1  Date of Follow-up: 10/20/2024 Time: 12:04 PM  Last Vitals:  Vitals:   10/20/24 0757 10/20/24 1153  BP: (!) 99/59 (!) 105/58  Pulse:  77  Resp: 18 16  Temp: 36.4 C 36.4 C  SpO2:  96%    Level of Consciousness: alert  Pain: none   Side Effects:None  Catheter Site Exam:clean, dry, no drainage  Anti-Coag Meds (From admission, onward)    Start     Dose/Rate Route Frequency Ordered Stop   10/20/24 2000  enoxaparin  (LOVENOX ) injection 40 mg        40 mg Subcutaneous Every 24 hours 10/19/24 2357          Plan: D/C from anesthesia care at surgeon's request  [facility]

## 2024-10-21 ENCOUNTER — Encounter: Payer: Self-pay | Admitting: Obstetrics and Gynecology

## 2024-10-21 MED ORDER — ACETAMINOPHEN 500 MG PO TABS
1000.0000 mg | ORAL_TABLET | Freq: Four times a day (QID) | ORAL | Status: DC
  Administered 2024-10-21 – 2024-10-23 (×9): 1000 mg via ORAL
  Filled 2024-10-21 (×10): qty 2

## 2024-10-21 NOTE — Discharge Instructions (Signed)
 Some PCP options in [place] area- not a comprehensive list  [facility]- [phone] [facility]- [phone] [facility]- [phone] [facility]- [phone] [facility]- [phone] Nichole Molly- [phone]  or [facility] Physician Referral Line [phone]   [facility] Office Address: [address] [place], [place], [place] [address] Phone: [phone] Hours:   Saturday Closed Sunday Closed Monday 8?AM-5?PM Tuesday 8?AM-5?PM Wednesday 8?AM-5?PM Thursday 8?AM-5?PM Friday             8 AM-5 PM

## 2024-10-21 NOTE — Lactation Note (Signed)
 This note was copied from a baby's chart. Lactation Consultation Note  Patient Name: Alyssa [name] Unijb'd Date: 10/21/2024 Age:27 hours Reason for consult: Follow-up assessment;Exclusive pumping and bottle feeding;NICU baby;Late-preterm 34-36.6wks;Infant < 5lbs;Multiple gestation   Maternal Data Does the patient have breastfeeding experience prior to this delivery?: Yes How long did the patient breastfeed?: 1 yr  Feeding Mother's Current Feeding Choice: Breast Milk and Donor Milk Mom states she last pumped at 1100 and obtained approx 15 cc colostrum.  Amount is increasing, answered questions about recertifying with WIC, encouraged her to call [facility]  on Monday am to make an appt to recertify and obtain an electric breast pump.  She had WIC in [place]. with her last child.      LATCH Score                    Lactation Tools Discussed/Used Tools: Pump Breast pump type: Double-Electric Breast Pump Reason for Pumping: twins in SCN Pumping frequency: encouraged to pump breasts q3h or 8x/24hr Pumped volume: 15 mL (at last pumping session around 1100)  Interventions LC name updated on white board   Discharge Pump: DEBP;Hands Free [facility] Program: No (to apply after discharge)  Consult Status Consult Status: PRN Date: 10/21/24 Follow-up type: In-patient    Aldona JONETTA Converse 10/21/2024, 2:06 PM
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
## 2024-10-21 NOTE — Progress Notes (Signed)
 Progress Note - Cesarean Delivery  Alyssa [name] is a 28 y.o. G2P1101 now PP day 2 s/p    Alyssa, [name] [[id number]]  C-Section, Low Transverse    Alyssa, [name] [[id number]]  C-Section, Low Transverse.   Subjective: Patient reports no problems with eating, passing gas, voiding, or their wound. Has been up to shower. Both babies are in SCN and are stable.  Objective: Vital signs in last 24 hours: Temp:  [97.6 F (36.4 C)-98.5 F (36.9 C)] 98 F (36.7 C) (11/28 0900) Pulse Rate:  [76-80] 80 (11/28 0900) Resp:  [18-20] 18 (11/28 0900) BP: (106-112)/(56-76) 112/76 (11/28 0900) SpO2:  [98 %-100 %] 100 % (11/28 0900)  Physical Exam: General: alert, cooperative, appears stated age, fatigued, and no distress Lochia: appropriate Uterine Fundus: firm @ U Incision: no significant drainage, dressing dry and intact BLE trace non, pitting edema. R slightly more than left. No warmth or redness. Has considerable eczema on both legs.    Data Review Recent Labs    10/19/24 2031 10/20/24 0510  HGB 10.2* 8.3*  HCT 33.1* 25.8*   Assessment: Cesarean delivery delivered- POD 2 from preterm twin delivery for FGR with FHR decels and BPP 4/8 in Twin B. Stable. Encouraged shower and allow RN to do dressing change.  Pain managed with po meds. Will continue.  Acute blood loss anemia significant for this hospital admission. S/p iron  infusion yesterday.   Plan: Continue current care. Anticipate D/c home tomorrow  Lauraine Lakes, CNM 10/21/2024 1:55 PM

## 2024-10-22 LAB — CBC
HCT: 21.6 % — ABNORMAL LOW (ref 36.0–46.0)
Hemoglobin: 6.9 g/dL — ABNORMAL LOW (ref 12.0–15.0)
MCH: 26.6 pg (ref 26.0–34.0)
MCHC: 31.9 g/dL (ref 30.0–36.0)
MCV: 83.4 fL (ref 80.0–100.0)
Platelets: 263 K/uL (ref 150–400)
RBC: 2.59 MIL/uL — ABNORMAL LOW (ref 3.87–5.11)
RDW: 15.4 % (ref 11.5–15.5)
WBC: 23.6 K/uL — ABNORMAL HIGH (ref 4.0–10.5)
nRBC: 0.4 % — ABNORMAL HIGH (ref 0.0–0.2)

## 2024-10-22 NOTE — Lactation Note (Signed)
 This note was copied from a baby's chart. Lactation Consultation Note  Patient Name: Alyssa [name] Date: 10/22/2024 Age:28 hours Reason for consult: Follow-up assessment;Late-preterm 34-36.6wks;NICU baby;multiple gestation   Maternal Data(see initial consult 10/20/24)  On follow-up visit mom has been pumping good amounts of breastmilk(105 ml's at 1330 pump session). Mom reports her breasts are feeling tight and uncomfortable. Mom's breasts noted to have slight engorgement at this time. Proactively recommended and provided mom with ice packs to apply to each breast after breastpumping for 20 minutes while reclining until her breasts feel softened with pumping and comfortable. Has patient been taught Hand Expression?: Yes Does the patient have breastfeeding experience prior to this delivery?: Yes How long did the patient breastfeed?: 1 year  Feeding Mother's Current Feeding Choice: Breast Milk and Donor Milk   Lactation Tools Discussed/Used Tools: Pump Breast pump type: Double-Electric Breast Pump Pump Education: Other (comment) (Completed on initiation of pumping) Reason for Pumping: LPT twins in SCN Pumping frequency: goal 8 times/24 hours Pumped volume:  (Mom has been pumping good volumes of breastmilk. This last pump session mom with slightly less expressed volume however mom is experiencing some breast engorgement. POst breastfeeding recommended mom use ice packs to breasts for 20 minutes and recline.) Provided mom with 27 mm breast flanges due to to breast engorgement. Discussed with mom not disposing the 24 mm flanges as once the swelling is improved she may need the 24  size again. Interventions Interventions: Education;Ice;DEBP  Discharge Discharge Education: Engorgement and breast care;Outpatient recommendation Pump: Hands Free;Personal (Mom cozies) WIC Program: No (Mom reports she plans on applying to [facility] in [facility].)  Consult  Status Consult Status: NICU follow-up Date: 10/23/24 Follow-up type: In-patient  Update provided to care nurse.  Alyssa [name] 10/22/2024, 7:47 PM

## 2024-10-22 NOTE — Progress Notes (Signed)
 Brief progress note: CBC drawn given increased right sided pain, dizziness this morning. Reviewed results with patient, initial postop CBC drawn <8h after delivery, she has received IV Fe. She reports dizziness is resolved & she would prefer to avoid IV placement & receiving blood transfusion. Discussed that given she is hemodynamically stable and ambulating without problem deferring transfusion is appropriate at this time. WBC are elevated, will continue to monitor temperature & HR. No evidence of infection on exam of dressing & incision site. CBC    Component Value Date/Time   WBC 23.6 (H) 10/22/2024 1454   RBC 2.59 (L) 10/22/2024 1454   HGB 6.9 (L) 10/22/2024 1454   HGB 9.5 (L) 09/02/2024 1042   HCT 21.6 (L) 10/22/2024 1454   HCT 28.9 (L) 09/02/2024 1042   PLT 263 10/22/2024 1454   PLT 240 09/02/2024 1042   MCV 83.4 10/22/2024 1454   MCV 89 09/02/2024 1042   MCH 26.6 10/22/2024 1454   MCHC 31.9 10/22/2024 1454   RDW 15.4 10/22/2024 1454   RDW 11.6 (L) 09/02/2024 1042   LYMPHSABS 1.3 10/19/2024 2031   LYMPHSABS 2.7 09/02/2024 1042   MONOABS 0.2 10/19/2024 2031   EOSABS 0.0 10/19/2024 2031   EOSABS 0.2 09/02/2024 1042   BASOSABS 0.0 10/19/2024 2031   BASOSABS 0.1 09/02/2024 1042

## 2024-10-22 NOTE — Progress Notes (Signed)
 Obstetric Postpartum/PostOperative Daily Progress Note Subjective:  28 y.o. G2P1101 post-operative day # 3 status post primary cesarean delivery.  She is ambulating but slowly & with assistance, is tolerating po, is voiding spontaneously, passing flatus.  Her pain is well controlled on PO pain medications. Her lochia is less than menses, she passed an egg sized clot overnight. She is concerned about her recovery & ability to care for herself independently at home, she will not have transportation to return to the hospital if bleeding increases again like it did last night and desires to remain inpatient for continued monitoring. Became dizzy while walking earlier today & has been napping since. Reports increased right sided incisional pain, she felt something pull when she was moving in bed last night and is concerned about her stitches.   Medications SCHEDULED MEDICATIONS   acetaminophen   1,000 mg Oral Q6H   enoxaparin  (LOVENOX ) injection  40 mg Subcutaneous Q24H   gabapentin   300 mg Oral QHS   ibuprofen   600 mg Oral Q6H   prenatal multivitamin  1 tablet Oral Q1200   scopolamine   1 patch Transdermal Once   senna-docusate  2 tablet Oral Daily   simethicone   80 mg Oral TID PC    MEDICATION INFUSIONS   lactated ringers  125 mL/hr at 10/20/24 0434   naloxone  HCl (NARCAN ) 2 mg in dextrose  5 % 250 mL infusion     sodium chloride       PRN MEDICATIONS  albuterol , coconut oil, witch hazel-glycerin  **AND** dibucaine, diphenhydrAMINE , diphenhydrAMINE  **OR** diphenhydrAMINE , diphenhydrAMINE , EPINEPHrine , menthol , methylPREDNISolone  (SOLU-MEDROL ) injection, morphine  injection, naloxone  **AND** sodium chloride  flush, naloxone  HCl (NARCAN ) 2 mg in dextrose  5 % 250 mL infusion, ondansetron  (ZOFRAN ) IV, oxyCODONE , simethicone , sodium chloride , zolpidem     Objective:   Vitals:   10/21/24 2113 10/21/24 2258 10/22/24 0239 10/22/24 0900  BP: 118/66 (!) 108/55 110/69 95/60  Pulse: 75 78 98 70  Resp: 18 18 20  18   Temp: 97.7 F (36.5 C) 98.1 F (36.7 C) 97.7 F (36.5 C) 98 F (36.7 C)  TempSrc: Oral   Oral  SpO2: 99% 99% 99% 98%  Weight:      Height:        Current Vital Signs 24h Vital Sign Ranges  T 98 F (36.7 C) Temp  Avg: 97.8 F (36.6 C)  Min: 97.5 F (36.4 C)  Max: 98.1 F (36.7 C)  BP 95/60 BP  Min: 95/60  Max: 118/66  HR 70 Pulse  Avg: 80.2  Min: 70  Max: 98  RR 18 Resp  Avg: 18.4  Min: 18  Max: 20  SaO2 98 % Room Air SpO2  Avg: 99 %  Min: 98 %  Max: 100 %       24 Hour I/O Current Shift I/O  Time Ins Outs No intake/output data recorded. No intake/output data recorded.  General: NAD Pulmonary: no increased work of breathing Abdomen: non-distended, non-tender, fundus firm at level of umbilicus Inc: Clean/dry/intact Extremities: no edema, no erythema, no tenderness  Labs:  Recent Labs  Lab 10/19/24 2031 10/20/24 0510  WBC 12.3* 20.6*  HGB 10.2* 8.3*  HCT 33.1* 25.8*  PLT 257 223     Assessment:   29 y.o. G2P1101 postoperative day # 3 status post primary cesarean section for di/di twins for FGR Postoperative anemia due to acute blood loss, significant for admission  Plan:  1) Acute blood loss anemia - hemodynamically stable, one episode of dizziness today, received IV Fe. Repeat CBC ordered. Abdominal binder  provided, use demonstrated. Encouraged ambulation.  2) AB POS Performed at [facility], [address] [place], [place] [address]  / Dama 13.90 (06/17 1454)/ Varicella Immune  3) TDAP: declined prenatally  4) Pumping- /Contraception = plans patch  5) Disposition: anticipate discharge home tomorrow  Harlene LITTIE Cisco, CNM

## 2024-10-23 MED ORDER — SIMETHICONE 80 MG PO CHEW: 80.0000 mg | CHEWABLE_TABLET | Freq: Three times a day (TID) | ORAL | 0 refills | Status: AC

## 2024-10-23 MED ORDER — IBUPROFEN 600 MG PO TABS: 600.0000 mg | ORAL_TABLET | Freq: Four times a day (QID) | ORAL | 0 refills | Status: AC

## 2024-10-23 MED ORDER — GABAPENTIN 300 MG PO CAPS: 300.0000 mg | ORAL_CAPSULE | Freq: Every day | ORAL | 0 refills | Status: AC

## 2024-10-23 MED ORDER — ALBUTEROL SULFATE (2.5 MG/3ML) 0.083% IN NEBU: 2.5000 mg | INHALATION_SOLUTION | Freq: Once | RESPIRATORY_TRACT | 12 refills | Status: AC | PRN

## 2024-10-23 MED ORDER — ACETAMINOPHEN 500 MG PO TABS: 1000.0000 mg | ORAL_TABLET | Freq: Four times a day (QID) | ORAL | 0 refills | Status: AC

## 2024-10-23 MED ORDER — WITCH HAZEL-GLYCERIN EX PADS: 1.0000 | MEDICATED_PAD | CUTANEOUS | 12 refills | Status: AC | PRN

## 2024-10-23 MED ORDER — DIBUCAINE (PERIANAL) 1 % EX OINT: 1.0000 | TOPICAL_OINTMENT | CUTANEOUS | Status: AC | PRN

## 2024-10-23 MED ORDER — SENNOSIDES-DOCUSATE SODIUM 8.6-50 MG PO TABS: 2.0000 | ORAL_TABLET | Freq: Two times a day (BID) | ORAL | 0 refills | Status: AC | PRN

## 2024-10-23 NOTE — Progress Notes (Signed)
 Discharge instructions reviewed w/ pt all questions answered. Pt to discharge home.

## 2024-10-24 LAB — SURGICAL PATHOLOGY

## 2024-10-26 ENCOUNTER — Ambulatory Visit

## 2024-10-26 ENCOUNTER — Encounter: Admitting: Licensed Practical Nurse

## 2024-10-26 ENCOUNTER — Other Ambulatory Visit

## 2024-11-03 ENCOUNTER — Encounter: Admitting: Certified Nurse Midwife

## 2024-11-03 ENCOUNTER — Other Ambulatory Visit

## 2024-11-07 ENCOUNTER — Ambulatory Visit

## 2024-11-07 ENCOUNTER — Other Ambulatory Visit

## 2024-11-07 NOTE — Lactation Note (Signed)
 This note was copied from a baby's chart. Lactation Consultation Note  Patient Name: Alyssa [name] Date: 11/07/2024 Age:27 wk.o.     Maternal Data LC met w/ mom at bedside for a NICU weekly follow up assessment.  Mom stated that pumping hasn't been going that well.  She also stated she hasn't had much of an appetite therefore is not eating.  She informed LC that she pumps when she can remember, and is using a portable pump.   Interventions LC discussed the importance of pumping at least 8x a day.  LC compared the babies feeding w/ how often she should be pumping.  Ideas were provided to mom to help or assist w/ eating and staying hydrated.  Encouraged mom to possibly increase suction, if it is not strong enough but NOT to where it hurts.  Mom verbalized understanding.     [name] S [name] 11/07/2024, 3:36 PM

## 2024-11-10 NOTE — Lactation Note (Signed)
 This note was copied from a baby's chart. Lactation Consultation Note  Patient Name: JOURNI MOFFA Hardrick-Chambers Unijb'd Date: 11/10/2024 Age:27 wk.o.   SCN RN notified LC that mom had been providing plenty of EBM and that had dropped to very little being brought in. Twin discharged yesterday and mom is not anticipated to be present today in [facility] for [facility] consult. Baby expected to be ad lib feedings by tomorrow.  Maternal Data    Feeding Nipple Type: Dr. Jonna Galli  [facility] Score                    Lactation Tools Discussed/Used Tools: Bottle;65F feeding tube / Syringe  Interventions    Discharge    Consult Status      Katheryn DELENA [place]on 11/10/2024, 2:59 PM

## 2024-11-11 ENCOUNTER — Encounter: Admitting: Advanced Practice Midwife

## 2024-11-11 ENCOUNTER — Other Ambulatory Visit
